# Patient Record
Sex: Female | Born: 1960 | Race: White | Hispanic: No
Health system: Midwestern US, Academic
[De-identification: ages and names within clinical notes are randomized; demographics above are authoritative.]

---

## 2016-10-13 ENCOUNTER — Encounter: Admit: 2016-10-13 | Discharge: 2016-10-13

## 2017-02-08 ENCOUNTER — Emergency Department: Admit: 2017-02-08 | Discharge: 2017-02-08 | Payer: PRIVATE HEALTH INSURANCE

## 2017-02-08 LAB — POC TROPONIN: Lab: 0 ng/mL (ref 0.00–0.05)

## 2017-02-08 LAB — CBC AND DIFF
Lab: 0.1 10*3/uL (ref 0–0.20)
Lab: 0.1 10*3/uL (ref 0–0.45)
Lab: 0.4 10*3/uL (ref 0–0.80)
Lab: 1 % (ref >60)
Lab: 1.3 10*3/uL (ref 1.0–4.8)
Lab: 10 % (ref 4–12)
Lab: 12 % (ref 11–15)
Lab: 12 g/dL (ref 12.0–15.0)
Lab: 2.3 K/UL (ref >60)
Lab: 230 K/UL (ref 150–400)
Lab: 3 % (ref 0–5)
Lab: 32 % (ref 24–44)
Lab: 34 g/dL (ref 32.0–36.0)
Lab: 35 % — ABNORMAL LOW (ref 36–45)
Lab: 4.2 K/UL — ABNORMAL LOW (ref 4.5–11.0)
Lab: 54 % (ref 41–77)
Lab: 7.6 FL (ref 7–11)

## 2017-02-08 LAB — PROTIME INR (PT): Lab: 1 FL (ref 0.8–1.2)

## 2017-02-08 LAB — PTT (APTT): Lab: 30 s (ref 20.0–36.0)

## 2017-02-08 LAB — COMPREHENSIVE METABOLIC PANEL: Lab: 136 MMOL/L — ABNORMAL LOW (ref 137–147)

## 2017-02-08 LAB — D-DIMER: Lab: 243 ng{FEU}/mL (ref <500)

## 2017-02-08 LAB — MAGNESIUM: Lab: 2.1 mg/dL — ABNORMAL LOW (ref 1.6–2.6)

## 2017-02-08 MED ORDER — NITROGLYCERIN 0.4 MG SL SUBL
.4 mg | SUBLINGUAL | 0 refills | Status: DC | PRN
Start: 2017-02-08 — End: 2017-02-09
  Administered 2017-02-08: 22:00:00 0.4 mg via SUBLINGUAL

## 2017-02-08 MED ORDER — ASPIRIN 81 MG PO CHEW
324 mg | Freq: Once | ORAL | 0 refills | Status: CP
Start: 2017-02-08 — End: ?
  Administered 2017-02-08: 22:00:00 324 mg via ORAL

## 2017-02-08 NOTE — ED Notes
Pt reports chest pain is relieved after one SLG NTG. Denies need for another nitro at this time.

## 2017-02-08 NOTE — ED Notes
Report to Brittini RN

## 2017-02-08 NOTE — ED Notes
Pt states at 8 a.m she had chest pain with soa, and dizziness, symptoms were intermittent ,c/o a heaviness to her chest, hx of anxiety and hx   PVC.  Pt placed on a monitor upon arrival, SR . Pt awake calm alert, color normal , skin warm and dry.   C/o feeling weak in her chest ,then has intermitten chest pain with intermitten  SOA .call light in reach , side rails up , bed locked in low position will continue to monitor.

## 2017-02-08 NOTE — ED Notes
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    Belongings disposition:  with patient at bedside/

## 2017-02-09 ENCOUNTER — Emergency Department
Admit: 2017-02-08 | Discharge: 2017-02-09 | Disposition: A | Discharge status: Home / Self Care | Payer: BC Managed Care – PPO

## 2017-02-09 DIAGNOSIS — R0789 Other chest pain: Principal | ICD-10-CM

## 2017-02-09 DIAGNOSIS — R002 Palpitations: ICD-10-CM

## 2017-02-10 ENCOUNTER — Encounter: Admit: 2017-02-10 | Discharge: 2017-02-10 | Payer: PRIVATE HEALTH INSURANCE

## 2017-02-10 ENCOUNTER — Ambulatory Visit: Admit: 2017-02-10 | Discharge: 2017-02-11 | Payer: BC Managed Care – PPO

## 2017-02-10 DIAGNOSIS — E785 Hyperlipidemia, unspecified: ICD-10-CM

## 2017-02-10 DIAGNOSIS — I499 Cardiac arrhythmia, unspecified: ICD-10-CM

## 2017-02-10 DIAGNOSIS — Q615 Medullary cystic kidney: ICD-10-CM

## 2017-02-10 DIAGNOSIS — N390 Urinary tract infection, site not specified: ICD-10-CM

## 2017-02-10 DIAGNOSIS — G4730 Sleep apnea, unspecified: ICD-10-CM

## 2017-02-10 DIAGNOSIS — R000 Tachycardia, unspecified: ICD-10-CM

## 2017-02-10 DIAGNOSIS — F329 Major depressive disorder, single episode, unspecified: ICD-10-CM

## 2017-02-10 DIAGNOSIS — E039 Hypothyroidism, unspecified: ICD-10-CM

## 2017-02-10 NOTE — Progress Notes
Date of Service: 02/10/2017    Holly Maldonado is a 56 y.o. female.       HPI       I had the pleasure of seeing Holly Maldonado for post ER cardiac follow-up.  She is a 56 year old with history of PVCs, sleep apnea, bulimia, and  dyslipidemia.  She has a remote history of tobacco use and a family history of premature coronary disease as well.  She follows with Dr. Clint Bolder routinely and last saw him in April.  She has a structurally normal heart.  She had a stress test around 2 years ago that was reportedly normal.  She had been on flecainide which was discontinued a year ago.  Since then she has had occasional palpitations that have not been particularly bothersome.    Over the past few weeks she has noticed an increase in palpitations as well as fatigue.  Tuesday she developed severe fatigue and chest heaviness that radiated into the left arm.  She is also had some shortness of breath with exertion.  She presented to the emergency department.  Her troponin was negative and her EKG showed no acute abnormalities.  She did have PVCs noted on the monitor but no other significant arrhythmias.  Her chest x-ray showed a small nodule but no other cardiopulmonary abnormalities.  It was recommended that she follow-up for potential stress test.    Holly Maldonado has felt better over the last couple days but she still has had fatigue and an increase in palpitations.  She states she stopped progesterone about 3 weeks ago and started taking black cohosh and primrose oil but she has had no other changes to her medications or lifestyle changes over the past few weeks.           Vitals:    02/10/17 0752   BP: 102/62   Pulse: 56   Weight: 72 kg (158 lb 12.8 oz)   Height: 1.626 m (5' 4)     Body mass index is 27.26 kg/m???.     Past Medical History  Patient Active Problem List    Diagnosis Date Noted   ??? Mobitz type 1 second degree atrioventricular block 08/26/2016     07/2016 Sleep study-mostly in REM sleep when OSAS severe ??? Bulimia 10/26/2013   ??? Hypokalemia 10/26/2013     Secondary to Hctz     ??? PVC's (premature ventricular contractions) 10/26/2013     Present on 2011 Holter  Suppress w/ exercise 2014  RVOT morphology (LB/Inf axis)     ??? Atrial tachycardia (HCC) 10/26/2013     2014 Bruce TM: 1-2' post exercise- a few PVCs, several 5-10 beat runs of AT at ~120bpm-as of 2015 she does not remember if she had any palpitation w/ the TM     ??? Maxillary sinus fracture (HCC) 08/22/2012   ??? Medullary sponge kidney 08/22/2012     2010 - HCTZ started by [facility].  Presented as hematuria.  CT-abdomen confirmed     ??? Hypothyroid    ??? Dyslipidemia    ??? Obstructive sleep apnea syndrome      07/2016 Moderate-severe, Mob1 in REM sleep when OSAS most severe     ??? Palpitations      03/19/10 Holter monitor:  Episodes of ventricular bigeminy associated with palpitations    multiple PVCs from EKG 08/14/12           Review of Systems   Constitution: Negative.   HENT: Negative.  Eyes: Negative.    Cardiovascular: Positive for chest pain (pressure), dyspnea on exertion and palpitations.   Respiratory: Negative.    Endocrine: Negative.    Hematologic/Lymphatic: Negative.    Skin: Negative.    Musculoskeletal: Negative.    Gastrointestinal: Negative.    Genitourinary: Negative.    Neurological: Negative.    Psychiatric/Behavioral: Negative.    Allergic/Immunologic: Negative.        Physical Exam  General Appearance: no acute distress  Skin: warm & intact  HEENT: unremarkable  Neck Veins: neck veins are flat & not distended  Carotid Arteries: no bruits  Chest Inspection: chest is normal in appearance  Auscultation/Percussion: lungs clear to auscultation, no rales, rhonchi, or wheezing  Cardiac Rhythm: regular rhythm & normal rate  Cardiac Auscultation: Normal S1 & S2, no S3 or S4, no rub  Murmurs: no cardiac murmurs   Extremities: no lower extremity edema; 2+ symmetric distal pulses  Abdominal Exam: soft, non-tender, no masses, bowel sounds normal Liver & Spleen: no organomegaly  Neurologic Exam: oriented to time, place and person; no focal neurologic deficits  Psychiatric: Normal mood and affect.  Behavior is normal. Judgment and thought content normal.             Problems Addressed Today  Encounter Diagnoses   Name Primary?   ??? PVC's (premature ventricular contractions) Yes   ??? Chest pain, unspecified type        Assessment and Plan       1.  Chest discomfort.  Her symptoms are atypical however she does have risk factors for coronary disease including dyslipidemia, remote tobacco use and a family history of premature coronary disease.  I will set her up with a stress echocardiogram to reassess her heart function and assess for ischemia.  2.  PVCs.  She had an increase in palpitations.  It is unclear if this represents an increase in PVCs however.  Her recent lab work done in the ER was normal.  She states she had her thyroid checked in August although I do not have the results of that.  She has had an increase in anxiety recently which may be contributing.  If her palpitations continue to be a problem I would recommend follow-up with Dr. Clint Bolder sooner than previously scheduled.    Thank you for allowing [name] to participate in the care of this pleasant individual.  If you have any other questions or concerns, please do not hesitate to contact us.           Current Medications (including today's revisions)  ??? aspirin 81 mg chewable tablet Chew 81 mg by mouth daily. Take with food.   ??? atorvastatin (LIPITOR) 10 mg tablet Take 10 mg by mouth daily.   ??? Cholecalciferol (Vitamin D3) 1,000 unit cap Take 1,000 Units by mouth.   ??? clonazePAM (KLONOPIN) 0.5 mg tablet Take 0.5 mg by mouth twice daily.   ??? DOCOSAHEXANOIC ACID/EPA (FISH OIL PO) Take 3,000 mg by mouth.   ??? fluoxetine(+) (PROZAC) 40 mg capsule Take 40 mg by mouth daily.   ??? lamoTRIgine (LAMICTAL) 25 mg tablet Take 50 mg by mouth daily. ??? levothyroxine (SYNTHROID) 125 mcg tablet Take 125 mcg by mouth daily.   ??? magnesium oxide (MAG-OX) 400 mg tablet Take 400 mg by mouth daily.   ??? vitamins, B complex tab Take 1 tablet by mouth daily.

## 2017-02-11 DIAGNOSIS — I493 Ventricular premature depolarization: ICD-10-CM

## 2017-02-11 DIAGNOSIS — R002 Palpitations: ICD-10-CM

## 2017-02-11 DIAGNOSIS — E785 Hyperlipidemia, unspecified: ICD-10-CM

## 2017-02-11 DIAGNOSIS — Z8249 Family history of ischemic heart disease and other diseases of the circulatory system: ICD-10-CM

## 2017-02-11 DIAGNOSIS — Z87891 Personal history of nicotine dependence: ICD-10-CM

## 2017-02-11 DIAGNOSIS — R079 Chest pain, unspecified: ICD-10-CM

## 2017-02-11 DIAGNOSIS — R0789 Other chest pain: Principal | ICD-10-CM

## 2017-02-18 ENCOUNTER — Encounter: Admit: 2017-02-18 | Discharge: 2017-02-18 | Payer: PRIVATE HEALTH INSURANCE

## 2017-02-18 ENCOUNTER — Ambulatory Visit: Admit: 2017-02-18 | Discharge: 2017-02-19 | Payer: BC Managed Care – PPO

## 2017-02-18 DIAGNOSIS — R079 Chest pain, unspecified: ICD-10-CM

## 2017-02-18 DIAGNOSIS — I493 Ventricular premature depolarization: Principal | ICD-10-CM

## 2017-02-18 NOTE — Progress Notes
[facility] Assessment for _________________________     Patient Name: Holly Maldonado  MR#: [id number]  DOB: 1960-07-20  Age: 56 y.o.  Gender: Female Requesting Physician (MD/DO):       Norman Clay, APRN     Referring Physician (MD/DO):            Study# _________ Date of Study:10__/12__/_   Date of Last Study: ____/____/_____ (ADAC / DSPECT)     Date of Last Office/Hosp Visit: ____/____/_____   If >24 hours complete the Assessment below:     > 24 Hour Assessment:   Risk Factors  (Y or N): Diabetes Mellitus  N Hypertension  N Cholesterol  Y Smoker  Y Packs per Day Years Quit 24 yrs ago     Since last MAC physician examination has the patient experienced:    Hospitalization: [x] No  [] Yes (explain) _____________________________   Emergency room visit: [x] No  [] Yes (explain) _____________________________     Change in CV symptoms:  Chest Pain [x] No [] Yes Scale  [] 1  [] 2  [] 3  [] 4  [] 5  [] 6  [] 7  [] 8  [] 9  [] 10   Shortness of Breath [] No [x] Yes _____________________________   Palpitations [] No [x] Yes _____________________________   Pre-syncope [x] No [] Yes _____________________________   Syncope [x] No [] Yes _____________________________   Transient ischemic attack /   Cardiovascular Accident [x] No [] Yes _____________________________   Hypertension [x] No [] Yes _____________________________     Other Indications/History:   Dyspnea; CP   Change in medication:   N/A    Allergies:   NKDA   Additional procedural comments:                     >1 year since last OV or New Patient (High Risk Screen):  Nutritional Risk: [x] None       Identified [] Unintentional Weight      Loss>10 lbs [] Unintentional Weight        Gain>10 lbs [] Non-Healing       Wound   Fall Risk: [x] None       Identified [] Hx of falls within      last 6 mo. [] Impaired       Balance/Mobility [] Use of Assistive       Device   Safety Screen: [x] None       Identified [] Patient does not feel safe at home [] Patient feels like harming      self or others         RN Assessment Signature: _____Lisa Larry Sierras, RN_________________ Date: __10__/_12___/_18___ Time: _8__ : _56__       Date of Dictation: ____/____/____       __________________________________________________  Reporting Physician Signature   MAC-UKH Pre-Test Assessment - Version: 2013-06-11

## 2017-02-23 ENCOUNTER — Encounter: Admit: 2017-02-23 | Discharge: 2017-02-23 | Payer: PRIVATE HEALTH INSURANCE

## 2017-02-23 NOTE — Telephone Encounter
Spoke with patient. She is requesting last week's stress echo results. Advised patient the results are not yet final but we will call her as soon as possible. She verbalizes an understanding and has no further questions at this time.

## 2017-09-01 ENCOUNTER — Ambulatory Visit: Admit: 2017-09-01 | Discharge: 2017-09-02 | Payer: BC Managed Care – PPO

## 2017-09-01 ENCOUNTER — Encounter: Admit: 2017-09-01 | Discharge: 2017-09-01 | Payer: PRIVATE HEALTH INSURANCE

## 2017-09-01 DIAGNOSIS — I493 Ventricular premature depolarization: Principal | ICD-10-CM

## 2017-09-06 ENCOUNTER — Encounter: Admit: 2017-09-06 | Discharge: 2017-09-06

## 2017-09-16 ENCOUNTER — Ambulatory Visit: Admit: 2017-09-16 | Discharge: 2017-09-17 | Payer: BC Managed Care – PPO

## 2017-09-16 ENCOUNTER — Encounter: Admit: 2017-09-16 | Discharge: 2017-09-16

## 2017-09-16 DIAGNOSIS — N390 Urinary tract infection, site not specified: ICD-10-CM

## 2017-09-16 DIAGNOSIS — E785 Hyperlipidemia, unspecified: ICD-10-CM

## 2017-09-16 DIAGNOSIS — Q615 Medullary cystic kidney: ICD-10-CM

## 2017-09-16 DIAGNOSIS — R000 Tachycardia, unspecified: ICD-10-CM

## 2017-09-16 DIAGNOSIS — E039 Hypothyroidism, unspecified: ICD-10-CM

## 2017-09-16 DIAGNOSIS — I499 Cardiac arrhythmia, unspecified: ICD-10-CM

## 2017-09-16 DIAGNOSIS — G4730 Sleep apnea, unspecified: ICD-10-CM

## 2017-09-16 DIAGNOSIS — F329 Major depressive disorder, single episode, unspecified: ICD-10-CM

## 2017-09-17 DIAGNOSIS — I441 Atrioventricular block, second degree: ICD-10-CM

## 2017-09-17 DIAGNOSIS — I493 Ventricular premature depolarization: Principal | ICD-10-CM

## 2017-12-29 ENCOUNTER — Encounter: Admit: 2017-12-29 | Discharge: 2017-12-29 | Payer: PRIVATE HEALTH INSURANCE

## 2018-02-16 ENCOUNTER — Encounter: Admit: 2018-02-16 | Discharge: 2018-02-16

## 2018-02-16 ENCOUNTER — Encounter: Admit: 2018-02-16 | Discharge: 2018-02-16 | Payer: PRIVATE HEALTH INSURANCE

## 2018-02-16 ENCOUNTER — Ambulatory Visit: Admit: 2018-02-16 | Discharge: 2018-02-17 | Payer: BC Managed Care – PPO

## 2018-02-16 DIAGNOSIS — F99 Mental disorder, not otherwise specified: ICD-10-CM

## 2018-02-16 DIAGNOSIS — J309 Allergic rhinitis, unspecified: ICD-10-CM

## 2018-02-16 DIAGNOSIS — E785 Hyperlipidemia, unspecified: ICD-10-CM

## 2018-02-16 DIAGNOSIS — R51 Headache: ICD-10-CM

## 2018-02-16 DIAGNOSIS — G4730 Sleep apnea, unspecified: ICD-10-CM

## 2018-02-16 DIAGNOSIS — E039 Hypothyroidism, unspecified: ICD-10-CM

## 2018-02-16 DIAGNOSIS — I499 Cardiac arrhythmia, unspecified: ICD-10-CM

## 2018-02-16 DIAGNOSIS — F4310 Post-traumatic stress disorder, unspecified: ICD-10-CM

## 2018-02-16 DIAGNOSIS — Q615 Medullary cystic kidney: ICD-10-CM

## 2018-02-16 DIAGNOSIS — Z8659 Personal history of other mental and behavioral disorders: ICD-10-CM

## 2018-02-16 DIAGNOSIS — M791 Myalgia, unspecified site: Principal | ICD-10-CM

## 2018-02-16 DIAGNOSIS — N390 Urinary tract infection, site not specified: ICD-10-CM

## 2018-02-16 DIAGNOSIS — R000 Tachycardia, unspecified: ICD-10-CM

## 2018-02-16 DIAGNOSIS — F329 Major depressive disorder, single episode, unspecified: ICD-10-CM

## 2018-02-16 MED ORDER — FLUOXETINE 20 MG PO CAP
ORAL_CAPSULE | Freq: Every day | 1 refills | Status: DC
Start: 2018-02-16 — End: 2018-04-18
  Filled 2018-02-16 (×2): qty 120, 30d supply, fill #1

## 2018-02-16 MED ORDER — CETIRIZINE-PSEUDOEPHEDRINE 5-120 MG PO TB12
1 | ORAL_TABLET | Freq: Two times a day (BID) | ORAL | 2 refills | 30.00000 days | Status: AC
Start: 2018-02-16 — End: 2018-03-22

## 2018-02-16 MED ORDER — CLONAZEPAM 0.5 MG PO TAB
ORAL_TABLET | Freq: Every day | 1 refills | Status: DC | PRN
Start: 2018-02-16 — End: 2018-04-18
  Filled 2018-02-16 (×2): qty 120, 30d supply, fill #1

## 2018-02-16 MED ORDER — MOMETASONE 50 MCG/ACTUATION NA SPRY
1 | Freq: Two times a day (BID) | NASAL | 2 refills | 26.00000 days | Status: AC
Start: 2018-02-16 — End: 2018-05-25
  Filled 2018-02-16 (×2): qty 17, 30d supply, fill #1

## 2018-02-16 MED ORDER — AZELASTINE 137 MCG (0.1 %) NA SPRA
1-2 | Freq: Two times a day (BID) | NASAL | 2 refills | 50.00000 days | Status: AC | PRN
Start: 2018-02-16 — End: 2018-05-25
  Filled 2018-02-16 (×2): qty 30, 25d supply, fill #1

## 2018-02-16 MED ORDER — OLOPATADINE 0.1 % OP DROP
1 [drp] | Freq: Two times a day (BID) | OPHTHALMIC | 2 refills | 27.50000 days | Status: AC | PRN
Start: 2018-02-16 — End: 2018-05-25
  Filled 2018-02-16 (×2): qty 5, 25d supply, fill #1

## 2018-02-16 MED ORDER — ARMODAFINIL 250 MG PO TAB
250 mg | ORAL_TABLET | Freq: Every day | ORAL | 1 refills | 30.00000 days | Status: DC
Start: 2018-02-16 — End: 2018-04-18
  Filled 2018-02-16 (×2): qty 30, 30d supply, fill #1

## 2018-03-15 ENCOUNTER — Encounter: Admit: 2018-03-15 | Discharge: 2018-03-15 | Payer: PRIVATE HEALTH INSURANCE

## 2018-03-15 ENCOUNTER — Encounter: Admit: 2018-03-15 | Discharge: 2018-03-15

## 2018-03-16 ENCOUNTER — Encounter: Admit: 2018-03-16 | Discharge: 2018-03-16 | Payer: PRIVATE HEALTH INSURANCE

## 2018-03-16 ENCOUNTER — Encounter: Admit: 2018-03-16 | Discharge: 2018-03-16

## 2018-03-17 ENCOUNTER — Encounter: Admit: 2018-03-17 | Discharge: 2018-03-17

## 2018-03-17 ENCOUNTER — Encounter: Admit: 2018-03-17 | Discharge: 2018-03-17 | Payer: PRIVATE HEALTH INSURANCE

## 2018-03-18 MED FILL — CLONAZEPAM 0.5 MG PO TAB: 0.5 mg | 30 days supply | Qty: 120 | Fill #2 | Status: CP

## 2018-03-18 MED FILL — MOMETASONE 50 MCG/ACTUATION NA SPRY: 50 mcg/actuation | NASAL | 30 days supply | Qty: 17 | Fill #2 | Status: CP

## 2018-03-18 MED FILL — FLUOXETINE 20 MG PO CAP: 20 mg | 30 days supply | Qty: 120 | Fill #2 | Status: CP

## 2018-03-20 ENCOUNTER — Encounter: Admit: 2018-03-20 | Discharge: 2018-03-20 | Payer: PRIVATE HEALTH INSURANCE

## 2018-03-21 ENCOUNTER — Encounter: Admit: 2018-03-21 | Discharge: 2018-03-21 | Payer: PRIVATE HEALTH INSURANCE

## 2018-03-22 ENCOUNTER — Encounter: Admit: 2018-03-22 | Discharge: 2018-03-22 | Payer: PRIVATE HEALTH INSURANCE

## 2018-03-22 MED ORDER — CETIRIZINE-PSEUDOEPHEDRINE 5-120 MG PO TB12
1 | ORAL_TABLET | Freq: Two times a day (BID) | ORAL | 2 refills | 30.00000 days | Status: AC
Start: 2018-03-22 — End: 2018-05-25

## 2018-03-23 ENCOUNTER — Encounter: Admit: 2018-03-23 | Discharge: 2018-03-23

## 2018-03-27 ENCOUNTER — Encounter: Admit: 2018-03-27 | Discharge: 2018-03-27 | Payer: PRIVATE HEALTH INSURANCE

## 2018-03-28 MED FILL — ARMODAFINIL 250 MG PO TAB: 250 mg | ORAL | 30 days supply | Qty: 30 | Fill #2 | Status: CP

## 2018-04-04 ENCOUNTER — Encounter: Admit: 2018-04-04 | Discharge: 2018-04-04 | Payer: PRIVATE HEALTH INSURANCE

## 2018-04-04 ENCOUNTER — Encounter: Admit: 2018-04-04 | Discharge: 2018-04-04

## 2018-04-04 MED ORDER — LEVOTHYROXINE 125 MCG PO CAP
125 ug | ORAL_CAPSULE | Freq: Every day | ORAL | 3 refills | Status: CN
Start: 2018-04-04 — End: ?

## 2018-04-04 MED ORDER — LEVOTHYROXINE 125 MCG PO TAB
ORAL_TABLET | Freq: Every day | ORAL | 3 refills | 30.00000 days | Status: AC
Start: 2018-04-04 — End: 2018-10-17
  Filled 2018-04-06 (×2): qty 90, 90d supply, fill #1

## 2018-04-04 MED ORDER — LEVOTHYROXINE 125 MCG PO CAP
125 ug | ORAL_CAPSULE | Freq: Every day | ORAL | 3 refills | 30.00000 days | Status: DC
Start: 2018-04-04 — End: 2018-04-04

## 2018-04-05 ENCOUNTER — Encounter: Admit: 2018-04-05 | Discharge: 2018-04-05 | Payer: PRIVATE HEALTH INSURANCE

## 2018-04-17 ENCOUNTER — Encounter: Admit: 2018-04-17 | Discharge: 2018-04-17 | Payer: PRIVATE HEALTH INSURANCE

## 2018-04-17 ENCOUNTER — Encounter: Admit: 2018-04-17 | Discharge: 2018-04-17

## 2018-04-17 MED ORDER — CETIRIZINE-PSEUDOEPHEDRINE 5-120 MG PO TB12
1 | ORAL_TABLET | Freq: Two times a day (BID) | ORAL | 2 refills | Status: CN
Start: 2018-04-17 — End: ?

## 2018-04-17 MED ORDER — CETIRIZINE-PSEUDOEPHEDRINE 5-120 MG PO TB12
1 | ORAL_TABLET | Freq: Two times a day (BID) | ORAL | 1 refills | 28.00000 days | Status: AC
Start: 2018-04-17 — End: 2018-05-25
  Filled 2018-04-18 (×2): qty 60, 24d supply, fill #1

## 2018-04-17 MED FILL — MOMETASONE 50 MCG/ACTUATION NA SPRY: 50 mcg/actuation | NASAL | 30 days supply | Qty: 17 | Fill #3 | Status: AC

## 2018-04-17 MED FILL — AZELASTINE 137 MCG (0.1 %) NA SPRA: 137 mcg (0.1 %) | NASAL | 25 days supply | Qty: 30 | Fill #2 | Status: AC

## 2018-04-18 ENCOUNTER — Encounter: Admit: 2018-04-18 | Discharge: 2018-04-18 | Payer: PRIVATE HEALTH INSURANCE

## 2018-04-18 MED ORDER — FLUOXETINE 20 MG PO CAP
ORAL_CAPSULE | Freq: Every day | 1 refills | Status: AC
Start: 2018-04-18 — End: 2018-05-25
  Filled 2018-04-18 (×2): qty 120, 30d supply, fill #1

## 2018-04-18 MED ORDER — ARMODAFINIL 250 MG PO TAB
250 mg | ORAL_TABLET | Freq: Every day | ORAL | 1 refills | Status: DC
Start: 2018-04-18 — End: 2018-08-24
  Filled 2018-04-28 (×2): qty 30, 30d supply, fill #1

## 2018-04-18 MED ORDER — CLONAZEPAM 0.5 MG PO TAB
ORAL_TABLET | Freq: Every day | 1 refills | Status: DC | PRN
Start: 2018-04-18 — End: 2019-03-19
  Filled 2018-04-18 (×2): qty 120, 30d supply, fill #1

## 2018-04-21 ENCOUNTER — Encounter: Admit: 2018-04-21 | Discharge: 2018-04-21

## 2018-04-27 ENCOUNTER — Ambulatory Visit: Admit: 2018-04-27 | Discharge: 2018-04-28 | Payer: BC Managed Care – PPO

## 2018-04-27 ENCOUNTER — Encounter: Admit: 2018-04-27 | Discharge: 2018-04-27

## 2018-04-27 ENCOUNTER — Encounter: Admit: 2018-04-27 | Discharge: 2018-04-27 | Payer: PRIVATE HEALTH INSURANCE

## 2018-04-27 DIAGNOSIS — Z8659 Personal history of other mental and behavioral disorders: ICD-10-CM

## 2018-04-27 DIAGNOSIS — N390 Urinary tract infection, site not specified: ICD-10-CM

## 2018-04-27 DIAGNOSIS — I499 Cardiac arrhythmia, unspecified: ICD-10-CM

## 2018-04-27 DIAGNOSIS — F329 Major depressive disorder, single episode, unspecified: ICD-10-CM

## 2018-04-27 DIAGNOSIS — G4733 Obstructive sleep apnea (adult) (pediatric): Principal | ICD-10-CM

## 2018-04-27 DIAGNOSIS — F4310 Post-traumatic stress disorder, unspecified: ICD-10-CM

## 2018-04-27 DIAGNOSIS — E785 Hyperlipidemia, unspecified: ICD-10-CM

## 2018-04-27 DIAGNOSIS — R000 Tachycardia, unspecified: ICD-10-CM

## 2018-04-27 DIAGNOSIS — G4730 Sleep apnea, unspecified: ICD-10-CM

## 2018-04-27 DIAGNOSIS — Z789 Other specified health status: ICD-10-CM

## 2018-04-27 DIAGNOSIS — F99 Mental disorder, not otherwise specified: ICD-10-CM

## 2018-04-27 DIAGNOSIS — E039 Hypothyroidism, unspecified: ICD-10-CM

## 2018-04-27 DIAGNOSIS — Q615 Medullary cystic kidney: ICD-10-CM

## 2018-04-27 MED ORDER — LEVOTHYROXINE 150 MCG PO CAP
ORAL_CAPSULE | Freq: Every day | ORAL | 2 refills | 30.00000 days | Status: DC
Start: 2018-04-27 — End: 2018-05-01
  Filled 2018-04-27: qty 60, 60d supply

## 2018-04-27 MED ORDER — AZITHROMYCIN 250 MG PO TAB
ORAL_TABLET | 1 refills | Status: AC
Start: 2018-04-27 — End: 2018-05-25
  Filled 2018-04-28 (×2): qty 6, 5d supply, fill #1

## 2018-04-28 ENCOUNTER — Encounter: Admit: 2018-04-28 | Discharge: 2018-04-28 | Payer: PRIVATE HEALTH INSURANCE

## 2018-04-30 ENCOUNTER — Encounter: Admit: 2018-04-30 | Discharge: 2018-04-30 | Payer: PRIVATE HEALTH INSURANCE

## 2018-04-30 DIAGNOSIS — G4733 Obstructive sleep apnea (adult) (pediatric): Principal | ICD-10-CM

## 2018-04-30 MED ORDER — GLYCOPYRROLATE 0.2 MG/ML IJ SOLN
.2 mg | Freq: Once | INTRAVENOUS | 0 refills | Status: CN
Start: 2018-04-30 — End: ?

## 2018-05-01 ENCOUNTER — Encounter: Admit: 2018-05-01 | Discharge: 2018-05-01

## 2018-05-01 ENCOUNTER — Encounter: Admit: 2018-05-01 | Discharge: 2018-05-01 | Payer: PRIVATE HEALTH INSURANCE

## 2018-05-01 ENCOUNTER — Ambulatory Visit: Admit: 2018-05-01 | Discharge: 2018-05-01

## 2018-05-01 DIAGNOSIS — G4733 Obstructive sleep apnea (adult) (pediatric): Principal | ICD-10-CM

## 2018-05-01 MED ORDER — LEVOTHYROXINE 150 MCG PO TAB
150 ug | ORAL_TABLET | Freq: Every day | ORAL | 0 refills | 30.00000 days | Status: DC
Start: 2018-05-01 — End: 2018-07-04
  Filled 2018-05-01: qty 90, 90d supply

## 2018-05-11 ENCOUNTER — Encounter: Admit: 2018-05-11 | Discharge: 2018-05-11 | Payer: PRIVATE HEALTH INSURANCE

## 2018-05-11 ENCOUNTER — Ambulatory Visit: Admit: 2018-05-11 | Discharge: 2018-05-12 | Payer: BC Managed Care – PPO

## 2018-05-11 ENCOUNTER — Encounter: Admit: 2018-05-11 | Discharge: 2018-05-11

## 2018-05-11 DIAGNOSIS — I499 Cardiac arrhythmia, unspecified: Secondary | ICD-10-CM

## 2018-05-11 DIAGNOSIS — N390 Urinary tract infection, site not specified: Secondary | ICD-10-CM

## 2018-05-11 DIAGNOSIS — R000 Tachycardia, unspecified: Secondary | ICD-10-CM

## 2018-05-11 DIAGNOSIS — G4730 Sleep apnea, unspecified: Secondary | ICD-10-CM

## 2018-05-11 DIAGNOSIS — E785 Hyperlipidemia, unspecified: Secondary | ICD-10-CM

## 2018-05-11 DIAGNOSIS — E039 Hypothyroidism, unspecified: Secondary | ICD-10-CM

## 2018-05-11 DIAGNOSIS — F99 Mental disorder, not otherwise specified: Secondary | ICD-10-CM

## 2018-05-11 DIAGNOSIS — Q615 Medullary cystic kidney: Secondary | ICD-10-CM

## 2018-05-11 DIAGNOSIS — R51 Headache: Secondary | ICD-10-CM

## 2018-05-11 DIAGNOSIS — F4310 Post-traumatic stress disorder, unspecified: Secondary | ICD-10-CM

## 2018-05-11 DIAGNOSIS — F329 Major depressive disorder, single episode, unspecified: Secondary | ICD-10-CM

## 2018-05-11 DIAGNOSIS — Z8659 Personal history of other mental and behavioral disorders: Secondary | ICD-10-CM

## 2018-05-11 DIAGNOSIS — J309 Allergic rhinitis, unspecified: Secondary | ICD-10-CM

## 2018-05-11 MED ORDER — PSEUDOEPHEDRINE HCL 120 MG PO TBER
120 mg | Freq: Two times a day (BID) | ORAL | 0 refills | Status: AC | PRN
Start: 2018-05-11 — End: 2018-05-25

## 2018-05-11 MED FILL — LEVOTHYROXINE 150 MCG PO TAB: 150 mcg | ORAL | 90 days supply | Qty: 90 | Fill #1 | Status: AC

## 2018-05-14 ENCOUNTER — Encounter: Admit: 2018-05-14 | Discharge: 2018-05-14 | Payer: PRIVATE HEALTH INSURANCE

## 2018-05-14 DIAGNOSIS — E785 Hyperlipidemia, unspecified: Secondary | ICD-10-CM

## 2018-05-14 DIAGNOSIS — E039 Hypothyroidism, unspecified: Secondary | ICD-10-CM

## 2018-05-14 DIAGNOSIS — F99 Mental disorder, not otherwise specified: Secondary | ICD-10-CM

## 2018-05-14 DIAGNOSIS — Q615 Medullary cystic kidney: Secondary | ICD-10-CM

## 2018-05-14 DIAGNOSIS — G4730 Sleep apnea, unspecified: Secondary | ICD-10-CM

## 2018-05-14 DIAGNOSIS — N390 Urinary tract infection, site not specified: Secondary | ICD-10-CM

## 2018-05-14 DIAGNOSIS — F4310 Post-traumatic stress disorder, unspecified: Secondary | ICD-10-CM

## 2018-05-14 DIAGNOSIS — I499 Cardiac arrhythmia, unspecified: Secondary | ICD-10-CM

## 2018-05-14 DIAGNOSIS — F329 Major depressive disorder, single episode, unspecified: Secondary | ICD-10-CM

## 2018-05-14 DIAGNOSIS — Z8659 Personal history of other mental and behavioral disorders: Secondary | ICD-10-CM

## 2018-05-14 DIAGNOSIS — R000 Tachycardia, unspecified: Secondary | ICD-10-CM

## 2018-05-15 ENCOUNTER — Encounter: Admit: 2018-05-15 | Discharge: 2018-05-15 | Payer: PRIVATE HEALTH INSURANCE

## 2018-05-15 ENCOUNTER — Encounter: Admit: 2018-05-15 | Discharge: 2018-05-15

## 2018-05-23 ENCOUNTER — Encounter: Admit: 2018-05-23 | Discharge: 2018-05-23 | Payer: PRIVATE HEALTH INSURANCE

## 2018-05-24 ENCOUNTER — Encounter: Admit: 2018-05-24 | Discharge: 2018-05-24 | Payer: PRIVATE HEALTH INSURANCE

## 2018-05-24 MED ORDER — VILAZODONE 40 MG PO TAB
40 mg | ORAL_TABLET | Freq: Every day | ORAL | 1 refills | 30.00000 days | Status: DC
Start: 2018-05-24 — End: 2018-07-25
  Filled 2018-05-27 (×2): qty 30, 30d supply, fill #1

## 2018-05-24 MED ORDER — MOMETASONE 50 MCG/ACTUATION NA SPRY
1 | Freq: Two times a day (BID) | NASAL | 2 refills | Status: CN
Start: 2018-05-24 — End: ?

## 2018-05-24 MED ORDER — CLONAZEPAM 1 MG PO TAB
.5 mg | ORAL_TABLET | Freq: Four times a day (QID) | ORAL | 1 refills | Status: DC | PRN
Start: 2018-05-24 — End: 2018-08-24
  Filled 2018-05-27 (×2): qty 60, 30d supply, fill #1

## 2018-05-24 MED ORDER — ARMODAFINIL 250 MG PO TAB
250 mg | ORAL_TABLET | Freq: Every day | ORAL | 1 refills | Status: AC
Start: 2018-05-24 — End: 2018-10-17
  Filled 2018-05-27 (×2): qty 30, 30d supply, fill #1

## 2018-05-25 ENCOUNTER — Encounter: Admit: 2018-05-25 | Discharge: 2018-05-25 | Payer: PRIVATE HEALTH INSURANCE

## 2018-05-25 ENCOUNTER — Ambulatory Visit: Admit: 2018-05-25 | Discharge: 2018-05-25 | Payer: BC Managed Care – PPO

## 2018-05-25 DIAGNOSIS — N390 Urinary tract infection, site not specified: Secondary | ICD-10-CM

## 2018-05-25 DIAGNOSIS — R000 Tachycardia, unspecified: Secondary | ICD-10-CM

## 2018-05-25 DIAGNOSIS — F329 Major depressive disorder, single episode, unspecified: Secondary | ICD-10-CM

## 2018-05-25 DIAGNOSIS — E039 Hypothyroidism, unspecified: Secondary | ICD-10-CM

## 2018-05-25 DIAGNOSIS — Z8659 Personal history of other mental and behavioral disorders: Secondary | ICD-10-CM

## 2018-05-25 DIAGNOSIS — G4730 Sleep apnea, unspecified: Secondary | ICD-10-CM

## 2018-05-25 DIAGNOSIS — F99 Mental disorder, not otherwise specified: Secondary | ICD-10-CM

## 2018-05-25 DIAGNOSIS — F4310 Post-traumatic stress disorder, unspecified: Secondary | ICD-10-CM

## 2018-05-25 DIAGNOSIS — J309 Allergic rhinitis, unspecified: Secondary | ICD-10-CM

## 2018-05-25 DIAGNOSIS — Q615 Medullary cystic kidney: Secondary | ICD-10-CM

## 2018-05-25 DIAGNOSIS — E785 Hyperlipidemia, unspecified: Secondary | ICD-10-CM

## 2018-05-25 DIAGNOSIS — I499 Cardiac arrhythmia, unspecified: Secondary | ICD-10-CM

## 2018-05-25 MED ORDER — CETIRIZINE-PSEUDOEPHEDRINE 5-120 MG PO TB12
1 | ORAL_TABLET | Freq: Two times a day (BID) | ORAL | 6 refills | 30.00000 days | Status: AC
Start: 2018-05-25 — End: 2018-10-17

## 2018-05-25 MED ORDER — AZELASTINE 137 MCG (0.1 %) NA SPRA
1-2 | Freq: Two times a day (BID) | NASAL | 6 refills | 50.00000 days | Status: AC | PRN
Start: 2018-05-25 — End: 2019-03-29
  Filled 2018-05-27 (×2): qty 30, 25d supply, fill #1

## 2018-05-25 MED ORDER — MOMETASONE 50 MCG/ACTUATION NA SPRY
1 | Freq: Two times a day (BID) | NASAL | 6 refills | 26.00000 days | Status: AC
Start: 2018-05-25 — End: 2019-03-29
  Filled 2018-05-27 (×2): qty 17, 30d supply, fill #1

## 2018-05-25 MED ORDER — OLOPATADINE 0.1 % OP DROP
1 [drp] | Freq: Two times a day (BID) | OPHTHALMIC | 6 refills | 27.50000 days | Status: AC | PRN
Start: 2018-05-25 — End: 2019-08-14
  Filled 2018-05-27 (×2): qty 5, 25d supply, fill #1

## 2018-05-25 MED ORDER — MONTELUKAST 10 MG PO TAB
10 mg | ORAL_TABLET | Freq: Every evening | ORAL | 6 refills | 90.00000 days | Status: AC
Start: 2018-05-25 — End: 2019-03-20
  Filled 2018-05-27 (×2): qty 30, 30d supply, fill #1

## 2018-05-26 ENCOUNTER — Encounter: Admit: 2018-05-26 | Discharge: 2018-05-26 | Payer: PRIVATE HEALTH INSURANCE

## 2018-05-30 ENCOUNTER — Encounter: Admit: 2018-05-30 | Discharge: 2018-05-30

## 2018-06-06 ENCOUNTER — Encounter: Admit: 2018-06-06 | Discharge: 2018-06-06 | Payer: PRIVATE HEALTH INSURANCE

## 2018-06-06 MED ORDER — ROSUVASTATIN 10 MG PO TAB
ORAL_TABLET | Freq: Every day | ORAL | 0 refills | 90.00000 days | Status: DC
Start: 2018-06-06 — End: 2018-07-24
  Filled 2018-06-07 (×2): qty 90, 90d supply, fill #1

## 2018-06-07 ENCOUNTER — Encounter: Admit: 2018-06-07 | Discharge: 2018-06-07 | Payer: PRIVATE HEALTH INSURANCE

## 2018-06-19 ENCOUNTER — Encounter: Admit: 2018-06-19 | Discharge: 2018-06-19 | Payer: PRIVATE HEALTH INSURANCE

## 2018-06-19 MED ORDER — BUSPIRONE 10 MG PO TAB
10 mg | ORAL_TABLET | Freq: Two times a day (BID) | ORAL | 1 refills | Status: AC
Start: 2018-06-19 — End: 2018-09-04
  Filled 2018-06-20: qty 60, 30d supply

## 2018-06-20 ENCOUNTER — Encounter: Admit: 2018-06-20 | Discharge: 2018-06-20

## 2018-06-30 ENCOUNTER — Encounter: Admit: 2018-06-30 | Discharge: 2018-06-30

## 2018-07-03 ENCOUNTER — Encounter: Admit: 2018-07-03 | Discharge: 2018-07-03 | Payer: PRIVATE HEALTH INSURANCE

## 2018-07-03 MED FILL — LEVOTHYROXINE 125 MCG PO TAB: 125 mcg | 90 days supply | Qty: 90 | Status: CN

## 2018-07-04 ENCOUNTER — Encounter: Admit: 2018-07-04 | Discharge: 2018-07-04 | Payer: PRIVATE HEALTH INSURANCE

## 2018-07-04 MED ORDER — LEVOTHYROXINE 150 MCG PO TAB
ORAL_TABLET | Freq: Every day | ORAL | 0 refills | 30.00000 days | Status: AC
Start: 2018-07-04 — End: 2018-12-15
  Filled 2018-09-14 (×2): qty 90, 90d supply, fill #1

## 2018-07-04 MED FILL — CLONAZEPAM 1 MG PO TAB: 1 mg | ORAL | 30 days supply | Qty: 60 | Fill #2 | Status: CP

## 2018-07-04 MED FILL — MONTELUKAST 10 MG PO TAB: 10 mg | ORAL | 30 days supply | Qty: 30 | Fill #2 | Status: CP

## 2018-07-04 MED FILL — MOMETASONE 50 MCG/ACTUATION NA SPRY: 50 mcg/actuation | NASAL | 30 days supply | Qty: 17 | Fill #2 | Status: CP

## 2018-07-04 MED FILL — ARMODAFINIL 250 MG PO TAB: 250 mg | ORAL | 30 days supply | Qty: 30 | Fill #2 | Status: CP

## 2018-07-04 MED FILL — VILAZODONE 40 MG PO TAB: 40 mg | ORAL | 30 days supply | Qty: 30 | Fill #2 | Status: CP

## 2018-07-04 MED FILL — BUSPIRONE 10 MG PO TAB: 10 mg | ORAL | 30 days supply | Qty: 60 | Fill #1 | Status: CP

## 2018-07-06 ENCOUNTER — Encounter: Admit: 2018-07-06 | Discharge: 2018-07-06 | Payer: PRIVATE HEALTH INSURANCE

## 2018-07-06 MED ORDER — BUSPIRONE 10 MG PO TAB
20 mg | ORAL_TABLET | Freq: Two times a day (BID) | ORAL | 1 refills | Status: AC
Start: 2018-07-06 — End: 2018-09-04
  Filled 2018-07-26 (×2): qty 60, 15d supply, fill #1

## 2018-07-06 MED ORDER — ATOMOXETINE 18 MG PO CAP
ORAL_CAPSULE | Freq: Every day | ORAL | 0 refills | Status: AC
Start: 2018-07-06 — End: 2018-09-04
  Filled 2018-07-07 (×2): qty 46, 30d supply, fill #1

## 2018-07-24 ENCOUNTER — Encounter: Admit: 2018-07-24 | Discharge: 2018-07-24 | Payer: PRIVATE HEALTH INSURANCE

## 2018-07-24 MED ORDER — ROSUVASTATIN 10 MG PO TAB
10 mg | ORAL_TABLET | Freq: Every day | ORAL | 0 refills | 90.00000 days | Status: DC
Start: 2018-07-24 — End: 2018-11-08
  Filled 2018-08-23 (×2): qty 90, 90d supply, fill #1

## 2018-07-24 MED ORDER — VILAZODONE 40 MG PO TAB
40 mg | ORAL_TABLET | Freq: Every day | ORAL | 1 refills | Status: AC
Start: 2018-07-24 — End: 2018-09-04
  Filled 2018-07-28 (×2): qty 30, 30d supply, fill #1

## 2018-07-25 ENCOUNTER — Encounter: Admit: 2018-07-25 | Discharge: 2018-07-25 | Payer: PRIVATE HEALTH INSURANCE

## 2018-07-25 ENCOUNTER — Encounter: Admit: 2018-07-25 | Discharge: 2018-07-25

## 2018-07-25 MED ORDER — ATOMOXETINE 40 MG PO CAP
40 mg | ORAL_CAPSULE | Freq: Every day | ORAL | 1 refills | Status: AC
Start: 2018-07-25 — End: 2018-09-04
  Filled 2018-07-26 (×2): qty 30, 30d supply, fill #1

## 2018-07-26 ENCOUNTER — Encounter: Admit: 2018-07-26 | Discharge: 2018-07-26 | Payer: PRIVATE HEALTH INSURANCE

## 2018-07-27 ENCOUNTER — Encounter: Admit: 2018-07-27 | Discharge: 2018-07-27

## 2018-07-27 ENCOUNTER — Encounter: Admit: 2018-07-27 | Discharge: 2018-07-27 | Payer: PRIVATE HEALTH INSURANCE

## 2018-07-28 MED FILL — MONTELUKAST 10 MG PO TAB: 10 mg | ORAL | 30 days supply | Qty: 30 | Fill #3 | Status: CP

## 2018-07-31 ENCOUNTER — Encounter: Admit: 2018-07-31 | Discharge: 2018-07-31 | Payer: PRIVATE HEALTH INSURANCE

## 2018-07-31 MED ORDER — BUSPIRONE 30 MG PO TAB
30 mg | ORAL_TABLET | Freq: Two times a day (BID) | ORAL | 1 refills | Status: AC
Start: 2018-07-31 — End: 2018-09-04
  Filled 2018-08-01 (×2): qty 60, 30d supply, fill #1

## 2018-08-01 ENCOUNTER — Encounter: Admit: 2018-08-01 | Discharge: 2018-08-01 | Payer: PRIVATE HEALTH INSURANCE

## 2018-08-03 ENCOUNTER — Encounter: Admit: 2018-08-03 | Discharge: 2018-08-03 | Payer: PRIVATE HEALTH INSURANCE

## 2018-08-03 NOTE — Telephone Encounter
Attempted to call pt. No answer. Left message advising pt that her appointment on 08-11-18 is being canceled due to the COVID-19 and that the office will call her when we are able to start rescheduling patients. Also offered pt a Telehealth appointment and advised her to call office if she is interested.

## 2018-08-21 ENCOUNTER — Encounter: Admit: 2018-08-21 | Discharge: 2018-08-21 | Payer: PRIVATE HEALTH INSURANCE

## 2018-08-21 ENCOUNTER — Encounter: Admit: 2018-08-21 | Discharge: 2018-08-21

## 2018-08-22 ENCOUNTER — Encounter: Admit: 2018-08-22 | Discharge: 2018-08-22 | Payer: PRIVATE HEALTH INSURANCE

## 2018-08-23 MED FILL — MOMETASONE 50 MCG/ACTUATION NA SPRY: 50 mcg/actuation | NASAL | 30 days supply | Qty: 17 | Fill #3 | Status: CP

## 2018-08-23 MED FILL — ARMODAFINIL 250 MG PO TAB: 250 mg | ORAL | 30 days supply | Qty: 30 | Fill #2 | Status: CP

## 2018-08-24 ENCOUNTER — Encounter: Admit: 2018-08-24 | Discharge: 2018-08-24

## 2018-08-24 ENCOUNTER — Encounter: Admit: 2018-08-24 | Discharge: 2018-08-24 | Payer: PRIVATE HEALTH INSURANCE

## 2018-08-24 MED ORDER — CLONAZEPAM 1 MG PO TAB
ORAL_TABLET | Freq: Four times a day (QID) | ORAL | 1 refills | 30.00000 days | Status: DC | PRN
Start: 2018-08-24 — End: 2018-11-09
  Filled 2018-08-25 (×2): qty 60, 30d supply, fill #1

## 2018-08-24 MED ORDER — LIOTHYRONINE 25 MCG PO TAB
ORAL_TABLET | Freq: Every day | ORAL | 0 refills | 30.00000 days | Status: DC
Start: 2018-08-24 — End: 2018-10-17
  Filled 2018-08-25 (×2): qty 46, 23d supply, fill #1

## 2018-08-24 MED ORDER — ARMODAFINIL 250 MG PO TAB
250 mg | ORAL_TABLET | Freq: Every day | ORAL | 1 refills | Status: DC
Start: 2018-08-24 — End: 2018-12-14
  Filled 2018-09-27 (×2): qty 30, 30d supply, fill #1

## 2018-08-25 ENCOUNTER — Encounter: Admit: 2018-08-25 | Discharge: 2018-08-25 | Payer: PRIVATE HEALTH INSURANCE

## 2018-09-04 ENCOUNTER — Encounter: Admit: 2018-09-04 | Discharge: 2018-09-04 | Payer: PRIVATE HEALTH INSURANCE

## 2018-09-04 ENCOUNTER — Ambulatory Visit: Admit: 2018-09-04 | Discharge: 2018-09-05 | Payer: BC Managed Care – PPO

## 2018-09-04 ENCOUNTER — Encounter: Admit: 2018-09-04 | Discharge: 2018-09-04

## 2018-09-04 DIAGNOSIS — E785 Hyperlipidemia, unspecified: ICD-10-CM

## 2018-09-04 DIAGNOSIS — F329 Major depressive disorder, single episode, unspecified: ICD-10-CM

## 2018-09-04 DIAGNOSIS — I499 Cardiac arrhythmia, unspecified: ICD-10-CM

## 2018-09-04 DIAGNOSIS — G4730 Sleep apnea, unspecified: ICD-10-CM

## 2018-09-04 DIAGNOSIS — Q615 Medullary cystic kidney: ICD-10-CM

## 2018-09-04 DIAGNOSIS — Z8659 Personal history of other mental and behavioral disorders: ICD-10-CM

## 2018-09-04 DIAGNOSIS — F4310 Post-traumatic stress disorder, unspecified: ICD-10-CM

## 2018-09-04 DIAGNOSIS — E039 Hypothyroidism, unspecified: ICD-10-CM

## 2018-09-04 DIAGNOSIS — F99 Mental disorder, not otherwise specified: ICD-10-CM

## 2018-09-04 DIAGNOSIS — N390 Urinary tract infection, site not specified: ICD-10-CM

## 2018-09-04 DIAGNOSIS — R000 Tachycardia, unspecified: ICD-10-CM

## 2018-09-05 ENCOUNTER — Ambulatory Visit: Admit: 2018-09-05 | Discharge: 2018-09-05 | Payer: BC Managed Care – PPO

## 2018-09-05 DIAGNOSIS — D352 Benign neoplasm of pituitary gland: ICD-10-CM

## 2018-09-05 DIAGNOSIS — G4733 Obstructive sleep apnea (adult) (pediatric): ICD-10-CM

## 2018-09-05 DIAGNOSIS — I441 Atrioventricular block, second degree: ICD-10-CM

## 2018-09-05 DIAGNOSIS — F502 Bulimia nervosa: Principal | ICD-10-CM

## 2018-09-05 DIAGNOSIS — R7989 Other specified abnormal findings of blood chemistry: ICD-10-CM

## 2018-09-05 DIAGNOSIS — E785 Hyperlipidemia, unspecified: ICD-10-CM

## 2018-09-05 DIAGNOSIS — Z789 Other specified health status: Secondary | ICD-10-CM

## 2018-09-05 DIAGNOSIS — E039 Hypothyroidism, unspecified: ICD-10-CM

## 2018-09-05 DIAGNOSIS — R002 Palpitations: ICD-10-CM

## 2018-09-05 LAB — ESTRADIOL (E2): Lab: <15 pg/mL

## 2018-09-05 LAB — LUTEINIZING HORMONE: Lab: 27 mU/mL

## 2018-09-05 LAB — 25-OH VITAMIN D (D2 + D3): Lab: 38 ng/mL (ref 30–80)

## 2018-09-05 LAB — ACTH: Lab: 21 pg/mL (ref 7–63)

## 2018-09-05 LAB — THYROID STIMULATING HORMONE-TSH: Lab: 0 uU/mL — ABNORMAL LOW (ref 0.35–5.00)

## 2018-09-05 LAB — FOLLICLE STIMULATING HORMONE: Lab: 68 mU/mL

## 2018-09-05 LAB — CORTISOL-AM: Lab: 12 ug/dL (ref 6.7–22.6)

## 2018-09-05 LAB — FREE T4 (FREE THYROXINE) ONLY: Lab: 1 ng/dL (ref 0.6–1.6)

## 2018-09-05 LAB — PROLACTIN: Lab: 6.6 ng/mL (ref 3.3–26.7)

## 2018-09-07 ENCOUNTER — Encounter: Admit: 2018-09-07 | Discharge: 2018-09-07 | Payer: PRIVATE HEALTH INSURANCE

## 2018-09-08 LAB — SOMATOMEDIN C, IGF-1
Lab: 0.1
Lab: 107

## 2018-09-12 ENCOUNTER — Encounter: Admit: 2018-09-12 | Discharge: 2018-09-13 | Payer: BC Managed Care – PPO

## 2018-09-12 ENCOUNTER — Encounter: Admit: 2018-09-12 | Discharge: 2018-09-12 | Payer: PRIVATE HEALTH INSURANCE

## 2018-09-12 NOTE — Progress Notes
Patient arrived to COVID clinic for COVID-19 testing 09/12/18 1311. Patient identity confirmed via photo I.D. Nasopharyngeal procedure explained to the patient.   Nasopharyngeal swab completed right  Patient education provided given and instructed patient self isolate until contacted w/ results and further instructions.   Swab collected by Marrianne Mood, RN.    Date symptoms began/reason for testing: 09/11/2018, muscle ache, congestion, fatigue and nausea

## 2018-09-13 ENCOUNTER — Encounter: Admit: 2018-09-13 | Discharge: 2018-09-13

## 2018-09-13 DIAGNOSIS — Z1159 Encounter for screening for other viral diseases: ICD-10-CM

## 2018-09-13 DIAGNOSIS — J069 Acute upper respiratory infection, unspecified: Principal | ICD-10-CM

## 2018-09-13 LAB — COVID-19 (SARS-COV-2) PCR

## 2018-09-14 ENCOUNTER — Encounter: Admit: 2018-09-14 | Discharge: 2018-09-14 | Payer: PRIVATE HEALTH INSURANCE

## 2018-09-14 ENCOUNTER — Encounter: Admit: 2018-09-14 | Discharge: 2018-09-14

## 2018-09-14 MED FILL — MONTELUKAST 10 MG PO TAB: 10 mg | ORAL | 30 days supply | Qty: 30 | Fill #4 | Status: AC

## 2018-09-18 ENCOUNTER — Encounter: Admit: 2018-09-18 | Discharge: 2018-09-18 | Payer: PRIVATE HEALTH INSURANCE

## 2018-09-19 ENCOUNTER — Encounter: Admit: 2018-09-19 | Discharge: 2018-09-19 | Payer: PRIVATE HEALTH INSURANCE

## 2018-09-19 MED ORDER — FLUOXETINE 20 MG PO CAP
60 mg | ORAL_CAPSULE | Freq: Every day | ORAL | 1 refills | Status: DC
Start: 2018-09-19 — End: 2018-11-27
  Filled 2018-09-19: qty 90, 30d supply

## 2018-09-21 ENCOUNTER — Encounter: Admit: 2018-09-21 | Discharge: 2018-09-21

## 2018-09-21 ENCOUNTER — Encounter: Admit: 2018-09-21 | Discharge: 2018-09-21 | Payer: PRIVATE HEALTH INSURANCE

## 2018-09-21 MED FILL — FLUOXETINE 20 MG PO CAP: 20 mg | ORAL | 30 days supply | Qty: 90 | Fill #1 | Status: AC

## 2018-09-25 ENCOUNTER — Encounter: Admit: 2018-09-25 | Discharge: 2018-09-25 | Payer: PRIVATE HEALTH INSURANCE

## 2018-09-25 MED ORDER — LATUDA 20 MG PO TAB
20 mg | ORAL_TABLET | Freq: Every day | ORAL | 1 refills | Status: DC
Start: 2018-09-25 — End: 2018-11-13
  Filled 2018-09-25: qty 30, 30d supply
  Filled 2018-09-25 (×2): qty 30, 30d supply, fill #1

## 2018-09-26 ENCOUNTER — Ambulatory Visit: Admit: 2018-09-26 | Discharge: 2018-09-26 | Payer: BC Managed Care – PPO

## 2018-09-26 ENCOUNTER — Encounter: Admit: 2018-09-26 | Discharge: 2018-09-26

## 2018-09-26 ENCOUNTER — Encounter: Admit: 2018-09-26 | Discharge: 2018-09-26 | Payer: PRIVATE HEALTH INSURANCE

## 2018-09-26 DIAGNOSIS — R000 Tachycardia, unspecified: ICD-10-CM

## 2018-09-26 DIAGNOSIS — I499 Cardiac arrhythmia, unspecified: ICD-10-CM

## 2018-09-26 DIAGNOSIS — E039 Hypothyroidism, unspecified: ICD-10-CM

## 2018-09-26 DIAGNOSIS — Z8659 Personal history of other mental and behavioral disorders: ICD-10-CM

## 2018-09-26 DIAGNOSIS — G4730 Sleep apnea, unspecified: ICD-10-CM

## 2018-09-26 DIAGNOSIS — F4310 Post-traumatic stress disorder, unspecified: ICD-10-CM

## 2018-09-26 DIAGNOSIS — Q615 Medullary cystic kidney: ICD-10-CM

## 2018-09-26 DIAGNOSIS — E785 Hyperlipidemia, unspecified: ICD-10-CM

## 2018-09-26 DIAGNOSIS — N390 Urinary tract infection, site not specified: ICD-10-CM

## 2018-09-26 DIAGNOSIS — F99 Mental disorder, not otherwise specified: ICD-10-CM

## 2018-09-26 DIAGNOSIS — Z780 Asymptomatic menopausal state: ICD-10-CM

## 2018-09-26 DIAGNOSIS — N951 Menopausal and female climacteric states: Principal | ICD-10-CM

## 2018-09-26 DIAGNOSIS — F329 Major depressive disorder, single episode, unspecified: ICD-10-CM

## 2018-09-26 MED ORDER — ESTRADIOL 0.05 MG/24 HR TD PTSW
1 | MEDICATED_PATCH | TRANSDERMAL | 11 refills | 30.00000 days | Status: DC
Start: 2018-09-26 — End: 2018-12-11
  Filled 2018-09-27 (×2): qty 8, 28d supply, fill #1

## 2018-09-27 ENCOUNTER — Encounter: Admit: 2018-09-27 | Discharge: 2018-09-27 | Payer: PRIVATE HEALTH INSURANCE

## 2018-09-27 MED FILL — CLONAZEPAM 1 MG PO TAB: 1 mg | 30 days supply | Qty: 60 | Fill #2 | Status: CP

## 2018-10-05 ENCOUNTER — Encounter: Admit: 2018-10-05 | Discharge: 2018-10-05 | Payer: PRIVATE HEALTH INSURANCE

## 2018-10-13 ENCOUNTER — Encounter: Admit: 2018-10-13 | Discharge: 2018-10-13

## 2018-10-14 ENCOUNTER — Encounter: Admit: 2018-10-14 | Discharge: 2018-10-14

## 2018-10-14 NOTE — Progress Notes
Request for the following medical records for purpose of continuity of care:   Has an appointment with LDB on 06/09    Please send most recent lab results.    Please Fax to:  [facility] - [phone]  Dr. Artist Beach  Attention:  Barbie Haggis, RN    Thank you

## 2018-10-15 ENCOUNTER — Encounter: Admit: 2018-10-15 | Discharge: 2018-10-15

## 2018-10-16 ENCOUNTER — Encounter: Admit: 2018-10-16 | Discharge: 2018-10-16

## 2018-10-16 MED FILL — LATUDA 20 MG PO TAB: 20 mg | ORAL | 30 days supply | Qty: 30 | Fill #2 | Status: AC

## 2018-10-16 MED FILL — MONTELUKAST 10 MG PO TAB: 10 mg | ORAL | 30 days supply | Qty: 30 | Fill #5 | Status: AC

## 2018-10-16 MED FILL — FLUOXETINE 20 MG PO CAP: 20 mg | ORAL | 30 days supply | Qty: 90 | Fill #2 | Status: AC

## 2018-10-17 ENCOUNTER — Ambulatory Visit: Admit: 2018-10-17 | Discharge: 2018-10-18

## 2018-10-17 ENCOUNTER — Encounter: Admit: 2018-10-17 | Discharge: 2018-10-17

## 2018-10-17 DIAGNOSIS — I441 Atrioventricular block, second degree: Secondary | ICD-10-CM

## 2018-10-17 DIAGNOSIS — G4730 Sleep apnea, unspecified: Secondary | ICD-10-CM

## 2018-10-17 DIAGNOSIS — I499 Cardiac arrhythmia, unspecified: Secondary | ICD-10-CM

## 2018-10-17 DIAGNOSIS — E785 Hyperlipidemia, unspecified: Secondary | ICD-10-CM

## 2018-10-17 DIAGNOSIS — F99 Mental disorder, not otherwise specified: Secondary | ICD-10-CM

## 2018-10-17 DIAGNOSIS — F4310 Post-traumatic stress disorder, unspecified: Secondary | ICD-10-CM

## 2018-10-17 DIAGNOSIS — Z8659 Personal history of other mental and behavioral disorders: Secondary | ICD-10-CM

## 2018-10-17 DIAGNOSIS — Q615 Medullary cystic kidney: Secondary | ICD-10-CM

## 2018-10-17 DIAGNOSIS — N390 Urinary tract infection, site not specified: Secondary | ICD-10-CM

## 2018-10-17 DIAGNOSIS — E039 Hypothyroidism, unspecified: Secondary | ICD-10-CM

## 2018-10-17 DIAGNOSIS — R002 Palpitations: Secondary | ICD-10-CM

## 2018-10-17 DIAGNOSIS — F329 Major depressive disorder, single episode, unspecified: Secondary | ICD-10-CM

## 2018-10-17 DIAGNOSIS — R000 Tachycardia, unspecified: Secondary | ICD-10-CM

## 2018-10-17 MED ORDER — CETIRIZINE 5 MG PO TAB
5 mg | ORAL_TABLET | Freq: Every morning | ORAL | 3 refills | Status: DC
Start: 2018-10-17 — End: 2019-03-29

## 2018-10-18 DIAGNOSIS — I493 Ventricular premature depolarization: Principal | ICD-10-CM

## 2018-10-24 ENCOUNTER — Encounter: Admit: 2018-10-24 | Discharge: 2018-10-24

## 2018-10-25 ENCOUNTER — Encounter: Admit: 2018-10-25 | Discharge: 2018-10-25

## 2018-10-25 MED FILL — MOMETASONE 50 MCG/ACTUATION NA SPRY: 50 mcg/actuation | NASAL | 30 days supply | Qty: 17 | Fill #4 | Status: CP

## 2018-10-25 MED FILL — ESTRADIOL 0.05 MG/24 HR TD PTSW: 0.05 mg/24 hr | TRANSDERMAL | 28 days supply | Qty: 8 | Fill #2 | Status: CP

## 2018-10-25 MED FILL — AZELASTINE 137 MCG (0.1 %) NA SPRA: 137 mcg (0.1 %) | NASAL | 25 days supply | Qty: 30 | Status: CN

## 2018-10-26 ENCOUNTER — Encounter: Admit: 2018-10-26 | Discharge: 2018-10-26

## 2018-10-30 ENCOUNTER — Encounter: Admit: 2018-10-30 | Discharge: 2018-10-30

## 2018-10-30 MED ORDER — TIROSINT 137 MCG PO CAP
137 ug | ORAL_CAPSULE | Freq: Every day | ORAL | 0 refills | 30.00000 days | Status: DC
Start: 2018-10-30 — End: 2019-03-19
  Filled 2018-10-30: qty 60, 60d supply

## 2018-10-30 MED ORDER — LEVOTHYROXINE 137 MCG PO TAB
137 ug | ORAL_TABLET | Freq: Every day | ORAL | 2 refills | 30.00000 days | Status: DC
Start: 2018-10-30 — End: 2019-03-19
  Filled 2018-11-03: qty 60, 60d supply, fill #1

## 2018-10-31 ENCOUNTER — Encounter: Admit: 2018-10-31 | Discharge: 2018-10-31

## 2018-11-02 ENCOUNTER — Encounter: Admit: 2018-11-02 | Discharge: 2018-11-02

## 2018-11-03 ENCOUNTER — Encounter: Admit: 2018-11-03 | Discharge: 2018-11-03

## 2018-11-06 ENCOUNTER — Encounter: Admit: 2018-11-06 | Discharge: 2018-11-06

## 2018-11-06 ENCOUNTER — Ambulatory Visit: Admit: 2018-11-06 | Discharge: 2018-11-07

## 2018-11-06 DIAGNOSIS — I493 Ventricular premature depolarization: Secondary | ICD-10-CM

## 2018-11-06 DIAGNOSIS — I441 Atrioventricular block, second degree: Secondary | ICD-10-CM

## 2018-11-07 ENCOUNTER — Encounter: Admit: 2018-11-07 | Discharge: 2018-11-07

## 2018-11-07 DIAGNOSIS — R002 Palpitations: Secondary | ICD-10-CM

## 2018-11-07 NOTE — Progress Notes
Holter Placement Record  Brand: Bio-Tel(CardioNet)  Ordering Physician: LDB   Diagnosis: PALPITATIONS  Length: 72  Serial Number: [id number]  Start Time: ???  Location where Holter was placed: placed in clinic on 10/17/18  Will Holter be returned by mail? Yes     Placed in clinic       Was sent back to bio tel with no enrollment.

## 2018-11-08 ENCOUNTER — Encounter: Admit: 2018-11-08 | Discharge: 2018-11-08

## 2018-11-08 MED ORDER — ROSUVASTATIN 10 MG PO TAB
10 mg | ORAL_TABLET | Freq: Every day | ORAL | 0 refills
Start: 2018-11-08 — End: ?

## 2018-11-08 MED ORDER — ROSUVASTATIN 10 MG PO TAB
10 mg | ORAL_TABLET | Freq: Every day | ORAL | 1 refills | 90.00000 days | Status: DC
Start: 2018-11-08 — End: 2019-01-05
  Filled 2018-11-08: qty 90, 90d supply

## 2018-11-08 MED FILL — ARMODAFINIL 250 MG PO TAB: 250 mg | ORAL | 30 days supply | Qty: 30 | Status: CN

## 2018-11-09 ENCOUNTER — Encounter: Admit: 2018-11-09 | Discharge: 2018-11-09

## 2018-11-09 ENCOUNTER — Encounter: Admit: 2018-11-09 | Discharge: 2018-11-10

## 2018-11-09 ENCOUNTER — Ambulatory Visit: Admit: 2018-12-15 | Discharge: 2018-12-16

## 2018-11-09 DIAGNOSIS — K5900 Constipation, unspecified: Principal | ICD-10-CM

## 2018-11-09 DIAGNOSIS — Z1159 Encounter for screening for other viral diseases: Secondary | ICD-10-CM

## 2018-11-09 DIAGNOSIS — R109 Unspecified abdominal pain: Secondary | ICD-10-CM

## 2018-11-09 MED ORDER — CLONAZEPAM 1 MG PO TAB
ORAL_TABLET | 1 refills
Start: 2018-11-09 — End: ?

## 2018-11-09 MED ORDER — CLONAZEPAM 1 MG PO TAB
.5 mg | ORAL_TABLET | Freq: Four times a day (QID) | ORAL | 1 refills | 30.00000 days | Status: DC
Start: 2018-11-09 — End: 2018-12-14
  Filled 2018-11-09: qty 60, 30d supply

## 2018-11-09 NOTE — Progress Notes
Patient arrived to [facility] for COVID-19 testing 11/09/18 1200. Patient identity confirmed via photo I.D. Nasopharyngeal procedure explained to the patient.   Nasopharyngeal swab completed right  Patient education provided given and instructed patient self isolate until contacted w/ results and further instructions.   Swab collected by Burney Gauze, CNA.    Date symptoms began/reason for testing: 11/06/2018 muscle aches, ABD pain, chills, congestions, fatigue    Duard Brady, BSN, RN

## 2018-11-10 LAB — COVID-19 (SARS-COV-2) PCR

## 2018-11-11 NOTE — Progress Notes
Patient had viewed results in MyChart. Additional MyChart message sent to patient.

## 2018-11-12 ENCOUNTER — Encounter: Admit: 2018-11-12 | Discharge: 2018-11-12

## 2018-11-12 MED FILL — ARMODAFINIL 250 MG PO TAB: 250 mg | ORAL | 30 days supply | Qty: 30 | Fill #2 | Status: AC

## 2018-11-12 MED FILL — CLONAZEPAM 1 MG PO TAB: 1 mg | ORAL | 30 days supply | Qty: 60 | Fill #1 | Status: AC

## 2018-11-12 MED FILL — ROSUVASTATIN 10 MG PO TAB: 10 mg | ORAL | 90 days supply | Qty: 90 | Fill #1 | Status: AC

## 2018-11-13 ENCOUNTER — Encounter: Admit: 2018-11-13 | Discharge: 2018-11-13

## 2018-11-13 DIAGNOSIS — E785 Hyperlipidemia, unspecified: Secondary | ICD-10-CM

## 2018-11-13 DIAGNOSIS — I499 Cardiac arrhythmia, unspecified: Secondary | ICD-10-CM

## 2018-11-13 DIAGNOSIS — F329 Major depressive disorder, single episode, unspecified: Secondary | ICD-10-CM

## 2018-11-13 DIAGNOSIS — F99 Mental disorder, not otherwise specified: Secondary | ICD-10-CM

## 2018-11-13 DIAGNOSIS — F4310 Post-traumatic stress disorder, unspecified: Secondary | ICD-10-CM

## 2018-11-13 DIAGNOSIS — R000 Tachycardia, unspecified: Secondary | ICD-10-CM

## 2018-11-13 DIAGNOSIS — E039 Hypothyroidism, unspecified: Secondary | ICD-10-CM

## 2018-11-13 DIAGNOSIS — G4730 Sleep apnea, unspecified: Secondary | ICD-10-CM

## 2018-11-13 DIAGNOSIS — N390 Urinary tract infection, site not specified: Secondary | ICD-10-CM

## 2018-11-13 DIAGNOSIS — Q615 Medullary cystic kidney: Secondary | ICD-10-CM

## 2018-11-13 DIAGNOSIS — Z8659 Personal history of other mental and behavioral disorders: Secondary | ICD-10-CM

## 2018-11-13 MED ORDER — LURASIDONE 20 MG PO TAB
20 mg | ORAL_TABLET | Freq: Every day | ORAL | 1 refills | 30.00000 days | Status: DC
Start: 2018-11-13 — End: 2018-12-14
  Filled 2018-11-13: qty 30, 30d supply, fill #1

## 2018-11-13 NOTE — Telephone Encounter
Attempted to contact patient prior to Telehealth appointment.  Patient didnt answer left voicemail with callback number.

## 2018-11-15 ENCOUNTER — Encounter: Admit: 2018-11-15 | Discharge: 2018-11-15

## 2018-11-17 ENCOUNTER — Ambulatory Visit: Admit: 2018-11-17 | Discharge: 2018-11-18

## 2018-11-17 DIAGNOSIS — J309 Allergic rhinitis, unspecified: Secondary | ICD-10-CM

## 2018-11-17 NOTE — Progress Notes
Telehealth Visit Note    Date of Service: 11/17/2018    Subjective:      Obtained patient's verbal consent to treat them and their agreement to [facility] financial policy and NPP via this telehealth visit during the [facility] Emergency       Holly Maldonado is a 58 y.o. female who presents for follow up ARC.    History of Present Illness    She's doing well.    She's compliant with Singulair 10mg  qHS, Nasonex 2p en daily, Astelin 1-2p en BID PRN, and patanol 1 dr Nolon Bussing eye BID PRN, used some recently.  Dr. Carolan Shiver changed her Zyrtec-D to plain Zyrtec, she felt more relieved with Zyrtec-D but he preferred to avoid decongestants with her h/o PVC's.      She's not interested in AIT.  She's satisfied with control of ARC on the above medications and doesn't wish to make changes.   Medications are tolerated without unacceptable side effects.       Review of Systems   Eyes: Positive for discharge and itching.         Objective:         ??? armodafiniL (NUVIGIL) 250 mg tablet Take one tablet by mouth daily.   ??? azelastine (ASTELIN) 137 mcg (0.1 %) nasal spray Apply one spray to two sprays to each nostril as directed twice daily as needed.   ??? cetirizine (ZYRTEC) 5 mg tablet Take one tablet by mouth every morning.   ??? Cholecalciferol (Vitamin D3) 1,000 unit cap Take 1,000 Units by mouth.   ??? clonazePAM (KLONOPIN) 0.5 mg tablet Take 1 tablet by mouth up to 4 daily as needed for anxiety.   ??? clonazePAM (KLONOPIN) 0.5 mg tablet Take 0.5 mg by mouth twice daily.   ??? clonazePAM (KLONOPIN) 1 mg tablet Take one-half tablet by mouth four times daily as needed.   ??? docosahexaenoic acid/epa (FISH OIL PO) Take  by mouth.   ??? estradioL (VIVELLE-DOT) 0.05 mg/24 hr patch Apply one patch to top of skin as directed twice weekly.   ??? FLUoxetine (PROZAC) 20 mg capsule Take three capsules by mouth daily as directed.   ??? fluoxetine(+) (PROZAC) 40 mg capsule Take 60 mg by mouth daily. ??? levothyroxine (SYNTHROID) 137 mcg tablet Take one tablet by mouth daily.   ??? levothyroxine (SYNTHROID) 150 mcg tablet Take one tablet by mouth daily   ??? levothyroxine (TIROSINT) 137 mcg capsule Take one capsule by mouth daily.   ??? lurasidone (LATUDA) 20 mg tablet Take 1 tablet by mouth daily with food   ??? magnesium oxide (MAG-OX) 400 mg tablet Take 400 mg by mouth daily.   ??? mometasone (NASONEX) 50 mcg/actuation nasal spray Apply one spray to each nostril as directed twice daily.   ??? montelukast (SINGULAIR) 10 mg tablet Take one tablet by mouth at bedtime daily.   ??? Multivitamins with Fluoride (MULTI-VITAMIN PO) Take  by mouth.   ??? olopatadine (PATANOL) 0.1 % ophthalmic solution Place one drop into eye(s) twice daily as needed for Allergy symptoms.   ??? rosuvastatin (CRESTOR) 10 mg tablet Take one tablet by mouth daily.     There were no vitals filed for this visit.  There is no height or weight on file to calculate BMI.     Physical Exam         Assessment and Plan:    Problem   Allergic Rhinitis    12-12-17 total IgE within normal limits   IgE  class II or greater to: 2 dust mite species  05-25-18 aeroallergen SPT positive to 2 cockroach species, 2 dust mite species, and 2 molds with appropriate controls  05-25-18 aeroallergen ID positive to 2 trees, 2 grasses, 1 weed, cat, dog, 1 mold with appropriate controls    Aeroallergen avoidance tips discussed and provided written copy  Continue saline sinus rinses daily using either boiled-then-cooled tap water or distilled water to mix with saline packets.  Continue Nasonex 2 sp en BID  Continue Astelin 1-2 sp en BID PRN rhinorrhea, PND, sneezing, or nasal itching  Continue Patanol 1 dr Nolon Bussing eye BID PRN itchy/watery eyes  Continue Zyrtec 10mg  daily  Continue singulair 10 mg once a day. Potential adverse effects of medications were discussed and all questions were answered to the best of my ability.     She previously preferred to leave c-ANCA/p-ANCA and anti-MPO/anti-PR3 antibodies on the back burner.    We discussed aeroallergen immunotherapy including the procedure, potential long term benefits, frequency of office visits needed during build up phase, potential slow onset of benefit, potential long duration of therapy/time commitment, cost, and potential risks including life-threatening anaphylaxis. We discussed the recommendation to only get injections in a physician's office setting equipped to manage anaphylaxis should it occur, and the recommendation to carry autoinjectable epinephrine to all injection appointments.  She will contact us if she wishes to pursue aero allergen immunotherapy in the future.              Return in about 6 months (around 05/20/2019).           4 minutes spent on this patient's encounter with counseling and coordination of care taking >50% of the visit.      Thank you for allowing [name] to participate in the care of this patient.??? Please feel free to contact us if there are any questions or concerns about the patient.    [name], MD  Assistant Professor  Division of Allergy, Immunology, and Rheumatology  Department of Medicine and Department of Pediatrics  [facility]

## 2018-11-21 ENCOUNTER — Encounter: Admit: 2018-11-21 | Discharge: 2018-11-21

## 2018-11-24 ENCOUNTER — Encounter: Admit: 2018-11-24 | Discharge: 2018-11-24

## 2018-11-24 MED ORDER — PROGESTERONE MICRONIZED 100 MG PO CAP
100 mg | ORAL_CAPSULE | Freq: Every evening | ORAL | PRN refills | Status: DC
Start: 2018-11-24 — End: 2019-03-19
  Filled 2018-11-24: qty 90, 90d supply, fill #1

## 2018-11-26 ENCOUNTER — Encounter: Admit: 2018-11-26 | Discharge: 2018-11-26

## 2018-11-27 ENCOUNTER — Encounter: Admit: 2018-11-27 | Discharge: 2018-11-27

## 2018-11-27 MED ORDER — FLUOXETINE 20 MG PO CAP
60 mg | ORAL_CAPSULE | Freq: Every day | ORAL | 2 refills | 30.00000 days | Status: DC
Start: 2018-11-27 — End: 2018-12-14
  Filled 2018-11-30: qty 90, 30d supply, fill #1

## 2018-11-27 MED ORDER — FLUOXETINE 20 MG PO CAP
60 mg | ORAL_CAPSULE | Freq: Every day | ORAL | 1 refills
Start: 2018-11-27 — End: ?

## 2018-11-27 MED FILL — MOMETASONE 50 MCG/ACTUATION NA SPRY: 50 mcg/actuation | NASAL | 30 days supply | Qty: 17 | Fill #5 | Status: AC

## 2018-11-27 MED FILL — MONTELUKAST 10 MG PO TAB: 10 mg | ORAL | 30 days supply | Qty: 30 | Fill #6 | Status: AC

## 2018-11-29 ENCOUNTER — Encounter: Admit: 2018-11-29 | Discharge: 2018-11-29

## 2018-12-06 ENCOUNTER — Encounter: Admit: 2018-12-06 | Discharge: 2018-12-06

## 2018-12-06 ENCOUNTER — Ambulatory Visit: Admit: 2018-12-06 | Discharge: 2018-12-06

## 2018-12-06 DIAGNOSIS — I441 Atrioventricular block, second degree: Secondary | ICD-10-CM

## 2018-12-06 DIAGNOSIS — F502 Bulimia nervosa: Principal | ICD-10-CM

## 2018-12-06 DIAGNOSIS — G4733 Obstructive sleep apnea (adult) (pediatric): Secondary | ICD-10-CM

## 2018-12-06 DIAGNOSIS — R7989 Other specified abnormal findings of blood chemistry: Secondary | ICD-10-CM

## 2018-12-06 DIAGNOSIS — D352 Benign neoplasm of pituitary gland: Secondary | ICD-10-CM

## 2018-12-06 DIAGNOSIS — E785 Hyperlipidemia, unspecified: Secondary | ICD-10-CM

## 2018-12-06 DIAGNOSIS — E039 Hypothyroidism, unspecified: Secondary | ICD-10-CM

## 2018-12-06 DIAGNOSIS — R002 Palpitations: Secondary | ICD-10-CM

## 2018-12-06 DIAGNOSIS — Z789 Other specified health status: Secondary | ICD-10-CM

## 2018-12-06 MED ORDER — GADOBENATE DIMEGLUMINE 529 MG/ML (0.1MMOL/0.2ML) IV SOLN
16 mL | Freq: Once | INTRAVENOUS | 0 refills | Status: CP
Start: 2018-12-06 — End: ?
  Administered 2018-12-06: 16:00:00 16 mL via INTRAVENOUS

## 2018-12-06 MED ORDER — SODIUM CHLORIDE 0.9 % IJ SOLN
50 mL | Freq: Once | INTRAVENOUS | 0 refills | Status: CP
Start: 2018-12-06 — End: ?
  Administered 2018-12-06: 16:00:00 50 mL via INTRAVENOUS

## 2018-12-07 ENCOUNTER — Encounter: Admit: 2018-12-07 | Discharge: 2018-12-07

## 2018-12-07 NOTE — Telephone Encounter
Good news the MRI of the brain is stable.     There are no concerning features.    Rhonda to call

## 2018-12-07 NOTE — Telephone Encounter
Called Patient relayed message in VM as permitted by HIPPA  Advised Patient to call if has questions

## 2018-12-11 ENCOUNTER — Encounter: Admit: 2018-12-11 | Discharge: 2018-12-11

## 2018-12-11 DIAGNOSIS — F329 Major depressive disorder, single episode, unspecified: Secondary | ICD-10-CM

## 2018-12-11 DIAGNOSIS — G4730 Sleep apnea, unspecified: Secondary | ICD-10-CM

## 2018-12-11 DIAGNOSIS — I499 Cardiac arrhythmia, unspecified: Secondary | ICD-10-CM

## 2018-12-11 DIAGNOSIS — N390 Urinary tract infection, site not specified: Secondary | ICD-10-CM

## 2018-12-11 DIAGNOSIS — F99 Mental disorder, not otherwise specified: Secondary | ICD-10-CM

## 2018-12-11 DIAGNOSIS — E039 Hypothyroidism, unspecified: Secondary | ICD-10-CM

## 2018-12-11 DIAGNOSIS — F4310 Post-traumatic stress disorder, unspecified: Secondary | ICD-10-CM

## 2018-12-11 DIAGNOSIS — E785 Hyperlipidemia, unspecified: Secondary | ICD-10-CM

## 2018-12-11 DIAGNOSIS — Z1159 Encounter for screening for other viral diseases: Secondary | ICD-10-CM

## 2018-12-11 DIAGNOSIS — Z8659 Personal history of other mental and behavioral disorders: Secondary | ICD-10-CM

## 2018-12-11 DIAGNOSIS — R000 Tachycardia, unspecified: Secondary | ICD-10-CM

## 2018-12-11 DIAGNOSIS — Q615 Medullary cystic kidney: Secondary | ICD-10-CM

## 2018-12-11 MED ORDER — PEG-ELECTROLYTE SOLN 420 GRAM PO SOLR
0 refills | Status: DC
Start: 2018-12-11 — End: 2019-03-19
  Filled 2018-12-12: qty 4000, 1d supply, fill #1

## 2018-12-11 NOTE — Pre-Anesthesia Patient Instructions
SPLIT DOSE NULYTELY/GOLYTELY PREP  PATIENT PREPARATION INSTRUCTIONS??? COLONOSCOPY      Tobey Grim are scheduled for a Colonoscopy on: 12/15/2018  at:  1:15 PM   With Dr. Francisco Capuchin, Stevphen Meuse, MD  You must arrive by:  12:15pm       ** Please keep in mind that our surgery schedule is fluid, and affected by multiple factors, so it is possible that your procedure time could change.  We will make every effort to maintain your original procedure time, but there are no guarantees. Thank you for your patience and understanding!  **      OUR ADDRESS IS [address] [place],  [address]  WE ARE LOCATED AT JUST OFF [address]   WE ARE ON [place] [address] [place] [address]  ONCE YOU'RE ON THE CAMPUS FOLLOW THE SIGNS TO THE SURGERY CENTER LOCATED ON THE EAST SIDE OF THE CAMPUS PARK AND ENTER THROUGH THE SURGERY CENTER DOORS.    To Reschedule call: [phone]  For Questions call: [phone]    ____________________________________________________________________________________    A colonoscopy is a scope examination of your colon or large intestine.  This is about (6) feet long in most adults.                         - A thin, flexible scope with a light and lens will be inserted through your rectum into your colon.  - The doctor will guide the scope along your colon to the junction of the large and small intestines.  - The test usually takes (30) minutes.  - Unless the test is prescribed for other reasons, the main purpose of the colonoscopy is to search for polyps.  - A colonoscopy can be used to screen for cancer or precancerous polyps.  It can also be used to assess the large intestine for various other diseases.  - If biopsies are obtained during the procedure, you will be notified of the results in (14) days.  - We will sedate you for the test by giving you medicines through an IV that will help you to relax and sleep.  We will keep you after the exam for (30) minutes. - The doctor will speak to you before and after the procedure.  - Unless you request otherwise, we will invite your driver to the recovery room to hear the doctor's post-exam report.  - The colon must be completely clean for the procedure to be both accurate and comprehensive. Please follow the preparation instructions carefully.  _________________________________________________________________________________________________________________    Instructions: Fill your prescription at least (3) days before your scheduled procedure time.    1 WEEK PRIOR:   1. Stop taking iron supplements (including multivitamins containing iron.)   2. Do not eat any foods containing nuts, seeds, or kernels (for example: sunflower seeds or popcorn).  3. Discuss diabetic medications and insulin with the prescribing physician.     5 DAYS PRIOR:  1. Check with your prescribing physician for instructions about stopping your blood thinner.  Examples of blood thinners are Aleve, Aspirin, Coumadin, Eliquis, Ibuprofen, Naproxen, Plavix and Xarelto.  2. Do not give yourself a Lovenox injection the morning of the test.  Lovenox injections may be taken as usual the day before the test.    4 DAYS PRIOR  1. Stop taking Metamucil, Perdiem, Citrucel, or any other bulk laxatives. Miralax is okay.    DAY PRIOR:  1. Beginning in the morning, start a clear liquid diet. Drink a generous amount of clear liquids throughout the day, no alcohol.  If you are on a fluid restriction, drink the recommended amount of clear liquids allowed by your physician. No solid or creamed/pureed foods.    Clear Liquid Diet (Avoid all items with RED, PURPLE or ORANGE coloring)  *Water  *Gatorade or sports drink- No red, purple or orange     *White Grape Juice    *Coffee or Tea without Cream  *Tea     *Apple Juice     *Soda Pop (all are ok)   *Green or Yellow Popsicles   *Beef Bouillon or Broth (No Noodles)  *Chicken Bouillon or Broth (No Noodles) *Hard Candy Special educational needs teacher, Life Saver, Mint or Gum)  *Jello (please no fruit in the jello or red, purple or orange in color)    2. At 11:00 A.M. fill the Nulytely/Golytely jug to the ???fill??? line with lukewarm drinking water. Cap the jug and shake to dissolve the powder.  Place the jug in the refrigerator.  3. At 5:00 P.M. drink one 8-ounce glass of Nulytely/Golytely and repeat every (10-15) minutes until you have finished the first (???) gallon of Nulytely/Golytely (3) liters. If you become nauseated hold off on drinking for (30) minutes or so, then resume.    DAY OF TEST:  1. Continue clear liquid diet  2. Five hours before your scheduled procedure time drink the remaining Nulytely/Golytely 8-ounces at a time. Repeat every (10-15) minutes until you have finished.   3. You may drink clear liquids up until (4) hours before your scheduled procedure time. After this you should have nothing by mouth. This includes GUM or CANDY.   a. Chewing tobacco must be stopped (6) hours before your scheduled procedure.   b. If you have an early morning test, take ONLY your essential morning medications (heart, blood pressure, seizure, etc.) with a small sip of water.  4. Bathe, brush teeth and gargle the morning of your procedure.   5. Wear comfortable, casual, loose fitting clothing that are easy to get on and off.  6. Take off any jewelry, take out any piercings, leave all valuables at home.  7. If you wear glasses or contacts, please bring a case for their safekeeping.   8. You will be sedated for the procedure. A responsible adult must drive you home (no Benedetto Goad, taxis or buses are allowed) and we ask that your ride stay at the facility until your procedure is finished. If you do not have a driver we will be unable to do the test.  9. Please plan to be here approximately 2 hours and encourage your driver to be within 20 minutes of the facility.   10. You will not be able to return to work the same day if you have received sedation.   11. Please bring a list of your current medications and dosages with you.  12. Please bring your ID and insurance card along with any required copay/deductible.  13. Please wear a mask and have your driver do the same (it does not have to be medical grade).        You are scheduled for a Covid-19 test  on 8/5 at 620pm.  Please see the map below for instructions        Pre-Admissions Testing RN  [facility]  [email or web address]  [address]   [place], [place] [address]  [phone] (Main)  [phone](UJW)  [email or web address]  An Affiliate of [facility]

## 2018-12-12 ENCOUNTER — Encounter: Admit: 2018-12-12 | Discharge: 2018-12-12

## 2018-12-13 DIAGNOSIS — Z01818 Encounter for other preprocedural examination: Secondary | ICD-10-CM

## 2018-12-13 NOTE — Progress Notes
Patient arrived to [place] clinic for COVID-19 testing 12/13/18 1747. Patient identity confirmed via photo I.D. Nasopharyngeal procedure explained to the patient.   Nasopharyngeal swab completed left  Patient education provided given and instructed patient self isolate until contacted w/ results and further instructions. CDC handout on COVID-19 given to patient.   [email or web address]    Swab collected by Tiffany Rito.    Date symptoms began/reason for testing: pre op 12/15/18

## 2018-12-14 ENCOUNTER — Encounter: Admit: 2018-12-14 | Discharge: 2018-12-14

## 2018-12-14 ENCOUNTER — Encounter: Admit: 2018-12-13 | Discharge: 2018-12-14

## 2018-12-14 DIAGNOSIS — Z1159 Encounter for screening for other viral diseases: Secondary | ICD-10-CM

## 2018-12-14 MED ORDER — LATUDA 20 MG PO TAB
10-20 mg | ORAL_TABLET | Freq: Every day | ORAL | 0 refills | Status: DC
Start: 2018-12-14 — End: 2019-03-19
  Filled 2018-12-17: qty 90, 90d supply, fill #1

## 2018-12-14 MED ORDER — CLONAZEPAM 1 MG PO TAB
.5 mg | ORAL_TABLET | Freq: Four times a day (QID) | ORAL | 0 refills | Status: DC | PRN
Start: 2018-12-14 — End: 2019-03-19
  Filled 2018-12-17: qty 180, 90d supply, fill #1

## 2018-12-14 MED ORDER — FLUOXETINE 20 MG PO CAP
60 mg | ORAL_CAPSULE | Freq: Every day | ORAL | 0 refills | Status: DC
Start: 2018-12-14 — End: 2019-03-19
  Filled 2018-12-17: qty 270, 90d supply, fill #1

## 2018-12-14 MED ORDER — ARMODAFINIL 250 MG PO TAB
250 mg | ORAL_TABLET | Freq: Every morning | ORAL | 0 refills | Status: DC
Start: 2018-12-14 — End: 2019-03-19
  Filled 2018-12-17: qty 90, 90d supply, fill #1

## 2018-12-15 ENCOUNTER — Encounter: Admit: 2018-12-15 | Discharge: 2018-12-15

## 2018-12-15 DIAGNOSIS — G4730 Sleep apnea, unspecified: Secondary | ICD-10-CM

## 2018-12-15 DIAGNOSIS — R000 Tachycardia, unspecified: Secondary | ICD-10-CM

## 2018-12-15 DIAGNOSIS — F4310 Post-traumatic stress disorder, unspecified: Secondary | ICD-10-CM

## 2018-12-15 DIAGNOSIS — I499 Cardiac arrhythmia, unspecified: Secondary | ICD-10-CM

## 2018-12-15 DIAGNOSIS — Q615 Medullary cystic kidney: Secondary | ICD-10-CM

## 2018-12-15 DIAGNOSIS — Z8659 Personal history of other mental and behavioral disorders: Secondary | ICD-10-CM

## 2018-12-15 DIAGNOSIS — E039 Hypothyroidism, unspecified: Secondary | ICD-10-CM

## 2018-12-15 DIAGNOSIS — F329 Major depressive disorder, single episode, unspecified: Secondary | ICD-10-CM

## 2018-12-15 DIAGNOSIS — N390 Urinary tract infection, site not specified: Secondary | ICD-10-CM

## 2018-12-15 DIAGNOSIS — E785 Hyperlipidemia, unspecified: Secondary | ICD-10-CM

## 2018-12-15 DIAGNOSIS — F99 Mental disorder, not otherwise specified: Secondary | ICD-10-CM

## 2018-12-15 LAB — COVID-19 (SARS-COV-2) PCR

## 2018-12-15 MED ORDER — PROPOFOL 10 MG/ML IV EMUL
INTRAVENOUS | 0 refills | Status: DC
Start: 2018-12-15 — End: 2018-12-15

## 2018-12-15 MED ORDER — SIMETHICONE 40 MG/0.6 ML PO DRPS
0 refills | Status: DC
Start: 2018-12-15 — End: 2018-12-20

## 2018-12-15 MED ORDER — LIDOCAINE (PF) 200 MG/10 ML (2 %) IJ SYRG
0 refills | Status: DC
Start: 2018-12-15 — End: 2018-12-15

## 2018-12-15 MED ORDER — ONDANSETRON 4 MG PO TBDI
4 mg | Freq: Once | ORAL | 0 refills | Status: AC | PRN
Start: 2018-12-15 — End: ?

## 2018-12-15 MED ORDER — LIDOCAINE (PF) 10 MG/ML (1 %) IJ SOLN
.1-2 mL | INTRAMUSCULAR | 0 refills | Status: DC | PRN
Start: 2018-12-15 — End: 2018-12-20

## 2018-12-15 MED ORDER — PROMETHAZINE 25 MG/ML IJ SOLN
6.25 mg | INTRAVENOUS | 0 refills | Status: DC | PRN
Start: 2018-12-15 — End: 2018-12-20

## 2018-12-15 MED ORDER — LACTATED RINGERS IV SOLP
1000 mL | INTRAVENOUS | 0 refills | Status: DC
Start: 2018-12-15 — End: 2018-12-20

## 2018-12-15 MED ORDER — PROPOFOL INJ 10 MG/ML IV VIAL
INTRAVENOUS | 0 refills | Status: DC
Start: 2018-12-15 — End: 2018-12-15

## 2018-12-15 MED ORDER — ONDANSETRON HCL (PF) 4 MG/2 ML IJ SOLN
4 mg | Freq: Once | INTRAVENOUS | 0 refills | Status: AC | PRN
Start: 2018-12-15 — End: ?

## 2018-12-15 NOTE — Anesthesia Pre-Procedure Evaluation
Anesthesia Pre-Procedure Evaluation    Name: Holly Maldonado      MRN: [id number]     DOB: [birth year]     Age: 58 y.o.     Sex: female   _________________________________________________________________________     Procedure Info:   Procedure Information     Date/Time:  12/15/18 1315    Procedure:  COLONOSCOPY DIAGNOSTIC WITH SPECIMEN COLLECTION BY BRUSHING/ WASHING - FLEXIBLE (N/A )    Location:  [id number]    Surgeon:  Veneta Penton, MD          Physical Assessment  Vital Signs (last filed in past 24 hours):  Temp: 36.8 ???C (98.2 ???F) (08/07 1222)      Patient History   Allergies   Allergen Reactions   ??? Fetzima [Levomilnacipran] SEE COMMENTS     Per pt very depressed, couldn't stop crying.        Current Medications    Medication Directions   armodafiniL (NUVIGIL) 250 mg tablet Take 1 tablet by mouth every morning   azelastine (ASTELIN) 137 mcg (0.1 %) nasal spray Apply one spray to two sprays to each nostril as directed twice daily as needed.   cetirizine (ZYRTEC) 5 mg tablet Take one tablet by mouth every morning.   Cholecalciferol (Vitamin D3) 1,000 unit cap Take 1,000 Units by mouth.   clonazePAM (KLONOPIN) 0.5 mg tablet Take 1 tablet by mouth up to 4 daily as needed for anxiety.   clonazePAM (KLONOPIN) 0.5 mg tablet Take 0.5 mg by mouth twice daily.   clonazePAM (KLONOPIN) 1 mg tablet Take one-half tablet by mouth four times daily as needed.   docosahexaenoic acid/epa (FISH OIL PO) Take  by mouth.   FLUoxetine (PROZAC) 20 mg capsule Take three capsules by mouth daily.   fluoxetine(+) (PROZAC) 40 mg capsule Take 60 mg by mouth daily.   levothyroxine (SYNTHROID) 137 mcg tablet Take one tablet by mouth daily.   levothyroxine (TIROSINT) 137 mcg capsule Take one capsule by mouth daily.   lurasidone (LATUDA) 20 mg tablet Take one-half tablet to one tablet by mouth daily with food.   mometasone (NASONEX) 50 mcg/actuation nasal spray Apply one spray to each nostril as directed twice daily. montelukast (SINGULAIR) 10 mg tablet Take one tablet by mouth at bedtime daily.   Multivitamins with Fluoride (MULTI-VITAMIN PO) Take  by mouth.   olopatadine (PATANOL) 0.1 % ophthalmic solution Place one drop into eye(s) twice daily as needed for Allergy symptoms.   peg-electrolyte solution (NULYTELY) 420 gram oral solution Take as directed.  Do not mix more than 24 hrs before procedure.  Refrigerate after mixing.   progesterone, micronized (PROMETRIUM) 100 mg capsule Take one capsule by mouth at bedtime daily.         Review of Systems/Medical History        PONV Screening: Female gender and Non-smoker  No history of anesthetic complications  No family history of anesthetic complications      Airway - negative        Pulmonary       Not a current smoker        No recent URI        Sleep apnea; noncompliant      Cardiovascular         No hypertension,         Dysrhythmias: PVCs.      GI/Hepatic/Renal         No GERD,  No hx of liver disease     No renal disease      Neuro/Psych       No seizures      No CVA        Psychiatric history          Depression          Anxiety        Endocrine/Other         Hypothyroidism      Pituitary adenoma - stable    Constitution - negative   Physical Exam    Airway Findings      Mallampati: II      TM distance: >3 FB      Neck ROM: full      Mouth opening: good    Dental Findings: Negative      Cardiovascular Findings:       Rhythm: regular      Rate: normal      No murmur    Pulmonary Findings:       Breath sounds clear to auscultation.    Neurological Findings:       Alert and oriented x 3    Constitutional findings: Negative       Diagnostic Tests  Hematology:   Lab Results   Component Value Date    HGB 12.0 02/08/2017    HCT 35.2 02/08/2017    PLTCT 230 02/08/2017    WBC 4.2 02/08/2017    NEUT 54 02/08/2017    ANC 2.30 02/08/2017    ALC 1.30 02/08/2017    MONA 10 02/08/2017    AMC 0.40 02/08/2017    EOSA 3 02/08/2017    ABC 0.10 02/08/2017    MCV 91.7 02/08/2017 MCH 31.2 02/08/2017    MCHC 34.0 02/08/2017    MPV 7.6 02/08/2017    RDW 12.9 02/08/2017         General Chemistry:   Lab Results   Component Value Date    NA 140 10/04/2018    K 4.4 10/04/2018    CL 104 10/04/2018    CO2 27 10/04/2018    GAP 8 02/08/2017    BUN 10 10/04/2018    CR 0.85 10/04/2018    GLU 90 10/04/2018    GLU 90 03/17/2010    CA 10.2 10/04/2018    ALBUMIN 4.7 10/04/2018    OBSCA 5.5 03/17/2010    MG 2.1 02/08/2017    TOTBILI 0.7 10/04/2018    PO4 4.0 03/17/2010      Coagulation:   Lab Results   Component Value Date    PTT 30.4 02/08/2017    INR 1.0 02/08/2017         Anesthesia Plan    ASA score: 2   Plan: MAC  Induction method: intravenous  NPO status: acceptable      Informed Consent  Anesthetic plan and risks discussed with patient.

## 2018-12-15 NOTE — Discharge Instructions - Supplementary Instructions
..  [address] [place], [place] [address]    Phone [phone]  [facility]  Fax [phone]    PROCEDURE:  ? Colonoscopy ? EGD ? Upper EUS ? Lower EUS ? Esophageal Dilatation                ? Biopsies taken  ? Polyps removed   ? Flex Sigmoidoscopy   ? Celiac Plexus Block                   After your procedure you may expect some of the following common symptoms, or perhaps none:  ?  You may have a sore throat for 2-3 days after the procedure.  Sucrets or Cepastat lozenges will help the discomfort.  ?  If you have any abdominal cramping after the procedure this can be relieved by belching or passing air.  ?  A small amount of bleeding is normal if a biopsy was taken or polyps removed.  ACTIVITY:   ?  Rest at home for the next 12 hours.  You may then begin to resume your normal activities.   ?  DO NOT drive any vehicle, operate any power tools, make any major decisions, or sign any legal documents for the next 12 hours.   ? You need someone to stay with you for 12 hours after the procedure.     MEDICATIONS:   ?  Resume your normal medications and their schedule.   ?  Start new prescription:  __________________________________   ?  Stop the following medications:  ___________________________   ? Avoid aspirin and aspirin containing products such as Anacin, Bufferin or Alka Seltzer for ________________ or as        Directed by your physician if a polypectomy has been performed.  (Tylenol is OK to use.)  DIET:   ?  If no nausea or vomiting occurs, you may resume your normal diet.   ? DO NOT drink alcohol, including beer or wine for the next 12 hours.   ? High fiber diet.    IV SITE:  If IV site is painful, place a warm, wet compress on the area and repeat until the soreness is better.  If not improved in 3 days, notify your physician's office.    CONTACT YOUR DOCTOR IF YOU HAVE:  - Nausea or vomiting that lasts greater than 12 hours.  - A temperature greater than 101???F unrelieved by Tylenol. - Severe or unusual abdominal discomfort or pain.  - Any bleeding from your mouth or bowels greater than two tablespoons and increasing.  - Increased shortness of breath.  - Coughing up blood, black tarry looking stools or passage of blood.  - Difficulty swallowing.  IF YOUR DOCTOR TOOK BIOPSIES OR TISSUE SAMPLES:  - Please allow 10 days for the results to be completed. You will receive a letter in the mail with results.  If you have any concerns after the procedure call:     *   GI Clinic [phone] (follow prompts to the doctors nurse)    - If after 5pm or weekends contact GI Fellow on call through the [place] operator [phone]    Next appointment/Follow up:  __See Report________________________________________________        I acknowledge receipt of the instructions above and understand them as explained.    We hope that your visit was a pleasant one.  Please complete the emailed patient satisfaction survey at your convenience. We appreciate your comments regarding your care.

## 2018-12-15 NOTE — H&P (View-Only)
Pre Procedure History and Physical/Sedation Plan    Name:Smrithi HELLEN SHANLEY                                                                   MRN: [id number]                 DOB:Oct 16, 1960          Age: 58 y.o.  Date of Service: 12/15/2018    Date of Procedure:  12/15/2018    Planned Procedure(s):  GI:  Colonoscopy  Sedation/Medication Plan: MAC (Monitored Anesthesia Care)  Discussion/Reviews:  Physician has discussed risks and alternatives of this type of sedation and above planned procedures with patient  ___________________________________________________________________  Chief Complaint: Average risk screening colonoscopy    History of Present Illness: Holly Maldonado is a 58 y.o. female with history as below    Previous Anesthetic/Sedation History:  reviewed    Medical History:   Diagnosis Date   ??? Arrhythmia     multiple PVCs from EKG 08/14/12   ??? depression    ??? Dyslipidemia    ??? Endometriosis    ??? H/O eating disorder    ??? Hematuria     microscopic   ??? Hypothyroid    ??? Medullary sponge kidney    ??? Mental disorder    ??? Nephrocalcinosis    ??? PTSD (post-traumatic stress disorder)    ??? Recurrent UTI    ??? Sleep apnea    ??? Tachycardia      Surgical History:   Procedure Laterality Date   ??? HX TUBAL LIGATION  2010   ??? ELECTROCARDIOGRAM     ??? HX BLADDER SUSPENSION     ??? HX CESAREAN SECTION     ??? HX HYSTERECTOMY     ??? HX SINUS SURGERY     ??? HX TONSILLECTOMY       Pertinent medical/surgical history reviewed  Pertinent family history reviewed  Social History     Tobacco Use   ??? Smoking status: Former Smoker     Packs/day: 1.00     Types: Cigarettes     Last attempt to quit: 08/22/1992     Years since quitting: 26.3   ??? Smokeless tobacco: Never Used   Substance Use Topics   ??? Alcohol use: No   ??? Drug use: No     Social History     Substance and Sexual Activity   Drug Use No     Allergies:  Fetzima [levomilnacipran]  Medications  Current Outpatient Medications   Medication Sig ??? armodafiniL (NUVIGIL) 250 mg tablet Take 1 tablet by mouth every morning   ??? azelastine (ASTELIN) 137 mcg (0.1 %) nasal spray Apply one spray to two sprays to each nostril as directed twice daily as needed.   ??? cetirizine (ZYRTEC) 5 mg tablet Take one tablet by mouth every morning.   ??? Cholecalciferol (Vitamin D3) 1,000 unit cap Take 1,000 Units by mouth.   ??? clonazePAM (KLONOPIN) 0.5 mg tablet Take 1 tablet by mouth up to 4 daily as needed for anxiety.   ??? clonazePAM (KLONOPIN) 0.5 mg tablet Take 0.5 mg by mouth twice daily.   ??? clonazePAM (KLONOPIN) 1 mg tablet Take one-half tablet by mouth four times daily as needed.   ???  docosahexaenoic acid/epa (FISH OIL PO) Take  by mouth.   ??? FLUoxetine (PROZAC) 20 mg capsule Take three capsules by mouth daily.   ??? fluoxetine(+) (PROZAC) 40 mg capsule Take 60 mg by mouth daily.   ??? levothyroxine (SYNTHROID) 137 mcg tablet Take one tablet by mouth daily.   ??? levothyroxine (TIROSINT) 137 mcg capsule Take one capsule by mouth daily.   ??? lurasidone (LATUDA) 20 mg tablet Take one-half tablet to one tablet by mouth daily with food.   ??? mometasone (NASONEX) 50 mcg/actuation nasal spray Apply one spray to each nostril as directed twice daily.   ??? montelukast (SINGULAIR) 10 mg tablet Take one tablet by mouth at bedtime daily.   ??? Multivitamins with Fluoride (MULTI-VITAMIN PO) Take  by mouth.   ??? olopatadine (PATANOL) 0.1 % ophthalmic solution Place one drop into eye(s) twice daily as needed for Allergy symptoms.   ??? peg-electrolyte solution (NULYTELY) 420 gram oral solution Take as directed.  Do not mix more than 24 hrs before procedure.  Refrigerate after mixing.   ??? progesterone, micronized (PROMETRIUM) 100 mg capsule Take one capsule by mouth at bedtime daily.     Current Facility-Administered Medications   Medication   ??? lactated ringers infusion   ??? lidocaine PF 1% (10 mg/mL) injection 0.1-2 mL   ??? simethicone (MYLICON) drops     Review of Systems: All other systems reviewed and are negative.           Physical Exam:  Temp: 36.8 ???C (98.2 ???F) (08/07 1222)  Physical Exam   General appearance: alert  Throat: Lips, mucosa, and tongue normal. Teeth and gums normal  Lungs: clear to auscultation bilaterally  Heart: regular rate and rhythm, S1, S2 normal, no murmur, click, rub or gallop  Abdomen: soft, non-tender. Bowel sounds normal. No masses,  no organomegaly  Extremities: extremities normal, atraumatic, no cyanosis or edema  Airway:  airway assessment performed  Mallampati II (soft palate, uvula, fauces visible)  Anesthesia Classification:  ASA II (A normal patient with mild systemic disease)  NPO Status: Acceptable  Pregnancy Status: Not Pregnant    Lab/Radiology/Other Diagnostic Tests  Labs:  Relevant labs reviewed      Normajean Baxter, MD  Gastroenterology Staff

## 2018-12-15 NOTE — Anesthesia Post-Procedure Evaluation
Post-Anesthesia Evaluation    Name: Holly Maldonado      MRN: [id number]     DOB: 06/27/1960     Age: 58 y.o.     Sex: female   __________________________________________________________________________     Procedure Information     Anesthesia Start Date/Time:  12/15/18 1335    Procedure:  COLONOSCOPY DIAGNOSTIC WITH SPECIMEN COLLECTION BY BRUSHING/ WASHING - FLEXIBLE (N/A )    Location:  [facility] [facility]    Surgeon:  Lanney Gins, MD          Post-Anesthesia Vitals  BP: 124/72 (08/07 1425)  Temp: 36.2 C (97.2 F) (08/07 1415)  Respirations: 16 PER MINUTE (08/07 1430)  SpO2: 100 % (08/07 1430)  SpO2 Pulse: 61 (08/07 1430)   Vitals Value Taken Time   BP 124/72 12/15/2018  2:25 PM   Temp 36.2 C (97.2 F) 12/15/2018  2:15 PM   Pulse     Respirations 16 PER MINUTE 12/15/2018  2:30 PM   SpO2 100 % 12/15/2018  2:30 PM         Post Anesthesia Evaluation Note    Evaluation location: Pre/Post  Patient participation: recovered; patient participated in evaluation  Level of consciousness: alert    Pain score: 0  Pain management: adequate    Hydration: normovolemia  Temperature: 36.0C - 38.4C  Airway patency: adequate    Perioperative Events       Post-op nausea and vomiting: no PONV    Postoperative Status  Cardiovascular status: hemodynamically stable  Respiratory status: spontaneous ventilation        Perioperative Events  Perioperative Event: No  Emergency Case Activation: No

## 2018-12-17 ENCOUNTER — Encounter: Admit: 2018-12-17 | Discharge: 2018-12-17

## 2018-12-18 ENCOUNTER — Encounter: Admit: 2018-12-18 | Discharge: 2018-12-18

## 2018-12-18 DIAGNOSIS — N390 Urinary tract infection, site not specified: Secondary | ICD-10-CM

## 2018-12-18 DIAGNOSIS — I499 Cardiac arrhythmia, unspecified: Secondary | ICD-10-CM

## 2018-12-18 DIAGNOSIS — Q615 Medullary cystic kidney: Secondary | ICD-10-CM

## 2018-12-18 DIAGNOSIS — E785 Hyperlipidemia, unspecified: Secondary | ICD-10-CM

## 2018-12-18 DIAGNOSIS — G4730 Sleep apnea, unspecified: Secondary | ICD-10-CM

## 2018-12-18 DIAGNOSIS — F329 Major depressive disorder, single episode, unspecified: Secondary | ICD-10-CM

## 2018-12-18 DIAGNOSIS — F99 Mental disorder, not otherwise specified: Secondary | ICD-10-CM

## 2018-12-18 DIAGNOSIS — F4310 Post-traumatic stress disorder, unspecified: Secondary | ICD-10-CM

## 2018-12-18 DIAGNOSIS — E039 Hypothyroidism, unspecified: Secondary | ICD-10-CM

## 2018-12-18 DIAGNOSIS — Z8659 Personal history of other mental and behavioral disorders: Secondary | ICD-10-CM

## 2018-12-18 DIAGNOSIS — R000 Tachycardia, unspecified: Secondary | ICD-10-CM

## 2018-12-20 ENCOUNTER — Encounter: Admit: 2018-12-20 | Discharge: 2018-12-20

## 2018-12-20 NOTE — Telephone Encounter
Discussed with patient's MRI findings.  Patient states that her blood pressure is well controlled. Reports that she gets intermittent headache. Also advised that findings of chronic microvascular ischemia could be due to uncontrolled hypertension or diabetes.  Recommended that if she is having headache or concerns about MRI finding to follow-up with PCP to determine whether patient needs to be seen by neurology or not

## 2018-12-20 NOTE — Telephone Encounter
-----   Message from Nelle Don, RN sent at 12/20/2018  8:58 AM CDT -----  Regarding: FW: Test Results Question  Contact: [phone]    ----- Message -----  From: Carroll Kinds  Sent: 12/20/2018   7:06 AM CDT  To: Ahmed Prima Nurse [facility]  Subject: Test Results Question                            I'm really concerned about this in the doctors test results letter, 2.  Mild nonspecific supratentorial white matter FLAIR hyperintensities, likely due to chronic microvessel ischemia and/or clinically reported headaches.    What does that mean, is it going to get worse how do I stop it. What problems can it cause.    Thanks   Holly Maldonado  [phone]

## 2019-01-03 ENCOUNTER — Encounter: Admit: 2019-01-03 | Discharge: 2019-01-03

## 2019-01-03 MED ORDER — TRIAMCINOLONE ACETONIDE 0.1 % TP CREA
Freq: Two times a day (BID) | TOPICAL | 2 refills | Status: DC
Start: 2019-01-03 — End: 2019-03-19
  Filled 2019-01-03: qty 30, 30d supply, fill #1

## 2019-01-03 MED ORDER — LEVOTHYROXINE 125 MCG PO TAB
125 ug | ORAL_TABLET | Freq: Every day | ORAL | 2 refills | 30.00000 days | Status: AC
Start: 2019-01-03 — End: ?
  Filled 2019-01-03: qty 60, 60d supply, fill #1

## 2019-01-03 MED FILL — OLOPATADINE 0.1 % OP DROP: 0.1 % | OPHTHALMIC | 25 days supply | Qty: 5 | Status: CN

## 2019-01-03 MED FILL — MOMETASONE 50 MCG/ACTUATION NA SPRY: 50 mcg/actuation | NASAL | 30 days supply | Qty: 17 | Fill #6 | Status: AC

## 2019-01-03 MED FILL — MONTELUKAST 10 MG PO TAB: 10 mg | ORAL | 30 days supply | Qty: 30 | Fill #7 | Status: AC

## 2019-01-03 MED FILL — AZELASTINE 137 MCG (0.1 %) NA SPRA: 137 mcg (0.1 %) | NASAL | 25 days supply | Qty: 30 | Status: CN

## 2019-01-03 MED FILL — MONTELUKAST 10 MG PO TAB: 10 mg | ORAL | 30 days supply | Qty: 30 | Status: CN

## 2019-01-03 MED FILL — AZELASTINE 137 MCG (0.1 %) NA SPRA: 137 mcg (0.1 %) | NASAL | 25 days supply | Qty: 30 | Fill #2 | Status: AC

## 2019-01-03 MED FILL — OLOPATADINE 0.1 % OP DROP: 0.1 % | OPHTHALMIC | 25 days supply | Qty: 5 | Fill #2 | Status: AC

## 2019-01-03 MED FILL — MOMETASONE 50 MCG/ACTUATION NA SPRY: 50 mcg/actuation | NASAL | 30 days supply | Qty: 17 | Status: CN

## 2019-01-04 ENCOUNTER — Encounter: Admit: 2019-01-04 | Discharge: 2019-01-04

## 2019-01-05 ENCOUNTER — Encounter: Admit: 2019-01-05 | Discharge: 2019-01-05

## 2019-01-05 MED ORDER — ROSUVASTATIN 10 MG PO TAB
10 mg | ORAL_TABLET | Freq: Every day | ORAL | 1 refills | 90.00000 days | Status: DC
Start: 2019-01-05 — End: 2019-08-14
  Filled 2019-01-05: qty 90, 90d supply, fill #1

## 2019-01-09 ENCOUNTER — Encounter: Admit: 2019-01-09 | Discharge: 2019-01-09

## 2019-01-09 DIAGNOSIS — Z1329 Encounter for screening for other suspected endocrine disorder: Secondary | ICD-10-CM

## 2019-01-09 DIAGNOSIS — E7800 Pure hypercholesterolemia, unspecified: Secondary | ICD-10-CM

## 2019-01-09 DIAGNOSIS — E559 Vitamin D deficiency, unspecified: Secondary | ICD-10-CM

## 2019-03-05 ENCOUNTER — Encounter: Admit: 2019-03-05 | Discharge: 2019-03-05

## 2019-03-19 ENCOUNTER — Encounter: Admit: 2019-03-19 | Discharge: 2019-03-19

## 2019-03-19 ENCOUNTER — Ambulatory Visit: Admit: 2019-03-19 | Discharge: 2019-03-20 | Payer: Commercial Managed Care - HMO

## 2019-03-19 DIAGNOSIS — N390 Urinary tract infection, site not specified: Secondary | ICD-10-CM

## 2019-03-19 DIAGNOSIS — Z8659 Personal history of other mental and behavioral disorders: Secondary | ICD-10-CM

## 2019-03-19 DIAGNOSIS — R000 Tachycardia, unspecified: Secondary | ICD-10-CM

## 2019-03-19 DIAGNOSIS — I493 Ventricular premature depolarization: Secondary | ICD-10-CM

## 2019-03-19 DIAGNOSIS — F4310 Post-traumatic stress disorder, unspecified: Secondary | ICD-10-CM

## 2019-03-19 DIAGNOSIS — D352 Benign neoplasm of pituitary gland: Secondary | ICD-10-CM

## 2019-03-19 DIAGNOSIS — G4730 Sleep apnea, unspecified: Secondary | ICD-10-CM

## 2019-03-19 DIAGNOSIS — E785 Hyperlipidemia, unspecified: Secondary | ICD-10-CM

## 2019-03-19 DIAGNOSIS — E039 Hypothyroidism, unspecified: Secondary | ICD-10-CM

## 2019-03-19 DIAGNOSIS — E7800 Pure hypercholesterolemia, unspecified: Secondary | ICD-10-CM

## 2019-03-19 DIAGNOSIS — F329 Major depressive disorder, single episode, unspecified: Secondary | ICD-10-CM

## 2019-03-19 DIAGNOSIS — E7841 Elevated Lipoprotein(a): Secondary | ICD-10-CM

## 2019-03-19 DIAGNOSIS — I499 Cardiac arrhythmia, unspecified: Secondary | ICD-10-CM

## 2019-03-19 DIAGNOSIS — G4733 Obstructive sleep apnea (adult) (pediatric): Secondary | ICD-10-CM

## 2019-03-19 DIAGNOSIS — F99 Mental disorder, not otherwise specified: Secondary | ICD-10-CM

## 2019-03-19 DIAGNOSIS — Q615 Medullary cystic kidney: Secondary | ICD-10-CM

## 2019-03-19 MED ORDER — EZETIMIBE 10 MG PO TAB
10 mg | ORAL_TABLET | Freq: Every day | ORAL | 3 refills | Status: DC
Start: 2019-03-19 — End: 2019-06-04
  Filled 2019-03-19: qty 90, 90d supply

## 2019-03-19 NOTE — Patient Instructions
Familial/Pure Hypercholesterolemia with an elevated Lp(a): Risk factors: age, wt, FH. Goal: LDL70mg /dL, NON-HDL100mg /dL and Lp(a)<75nmol/L.  Carotid IMT analysis.  Dietary consult.  ASA 81mg /qod for elevated Lp(a).  Have family members checked for Lp(a).  Initiate zetia 10mg /qd.  Change O-3-FA to EPA only.  Will consider further therapy on RTC in 4 months.

## 2019-03-20 ENCOUNTER — Encounter: Admit: 2019-03-20 | Discharge: 2019-03-20

## 2019-03-20 MED ORDER — MONTELUKAST 10 MG PO TAB
10 mg | ORAL_TABLET | Freq: Every evening | ORAL | 7 refills | 90.00000 days | Status: DC
Start: 2019-03-20 — End: 2019-03-29

## 2019-03-20 NOTE — Telephone Encounter
Patient requesting a refill of Singulair. LOV was 11/17/2018. Follow up scheduled on 05/14/19. Per LOV note: "Continue singulair 10 mg once a day."    Ok to refill per standing Allergy/Immunology refill protocol.

## 2019-03-23 ENCOUNTER — Encounter: Admit: 2019-03-23 | Discharge: 2019-03-23

## 2019-03-27 ENCOUNTER — Encounter: Admit: 2019-03-27 | Discharge: 2019-03-27

## 2019-03-28 ENCOUNTER — Ambulatory Visit: Admit: 2019-03-28 | Discharge: 2019-03-28 | Payer: Commercial Managed Care - HMO

## 2019-03-28 ENCOUNTER — Encounter: Admit: 2019-03-28 | Discharge: 2019-03-28

## 2019-03-28 ENCOUNTER — Ambulatory Visit: Admit: 2019-03-28 | Discharge: 2019-03-29 | Payer: Commercial Managed Care - HMO

## 2019-03-28 DIAGNOSIS — R000 Tachycardia, unspecified: Secondary | ICD-10-CM

## 2019-03-28 DIAGNOSIS — E7800 Pure hypercholesterolemia, unspecified: Secondary | ICD-10-CM

## 2019-03-28 DIAGNOSIS — E785 Hyperlipidemia, unspecified: Secondary | ICD-10-CM

## 2019-03-28 DIAGNOSIS — F4310 Post-traumatic stress disorder, unspecified: Secondary | ICD-10-CM

## 2019-03-28 DIAGNOSIS — Z8659 Personal history of other mental and behavioral disorders: Secondary | ICD-10-CM

## 2019-03-28 DIAGNOSIS — F329 Major depressive disorder, single episode, unspecified: Secondary | ICD-10-CM

## 2019-03-28 DIAGNOSIS — Q615 Medullary cystic kidney: Secondary | ICD-10-CM

## 2019-03-28 DIAGNOSIS — I499 Cardiac arrhythmia, unspecified: Secondary | ICD-10-CM

## 2019-03-28 DIAGNOSIS — R635 Abnormal weight gain: Secondary | ICD-10-CM

## 2019-03-28 DIAGNOSIS — E039 Hypothyroidism, unspecified: Secondary | ICD-10-CM

## 2019-03-28 DIAGNOSIS — G4730 Sleep apnea, unspecified: Secondary | ICD-10-CM

## 2019-03-28 DIAGNOSIS — N951 Menopausal and female climacteric states: Secondary | ICD-10-CM

## 2019-03-28 DIAGNOSIS — F99 Mental disorder, not otherwise specified: Secondary | ICD-10-CM

## 2019-03-28 DIAGNOSIS — N390 Urinary tract infection, site not specified: Secondary | ICD-10-CM

## 2019-03-28 MED ORDER — GABAPENTIN 300 MG PO CAP
300 mg | ORAL_CAPSULE | ORAL | 11 refills | Status: DC
Start: 2019-03-28 — End: 2019-08-14

## 2019-03-28 NOTE — Progress Notes
Date of Service: 03/28/2019    Subjective:             Holly Maldonado is a 58 y.o. female.    History of Present Illness  Here for menopause management.  She did see Dr. Samuella Cota in the spring 2028 was started on Vivelle dot 0.05 for vasomotor symptoms and worsening depression.  The Vivelle-Dot did not help.  She has gained weight about 40 pounds.  She admits she has had a eating disorder since her teen years.  As she her thyroid is underactive her primary care provider is managing her Synthroid medication.  Elevated cholesterol saw Dr.Moriarty who checked a light bow protein a level which was markedly elevated.  She is scheduled for a carotid IM T study after the first of the year.  She is meeting with a dietitian within the next month.  He changed her cholesterol medication.  She states history of major depression her depressed mood has become worse she had a hysterectomy several years ago for benign disease.  Hot flashes wax and wane.  After trying Vivelle-Dot, she went to a hormone doctor who gave her injections which made her mood and symptoms all over the place she stopped going to him.  Her main concerns are vasomotor symptoms, weight gain, and worsened depressed mood.  Difficulty sleeping present       Review of Systems   Constitutional: Positive for fatigue and unexpected weight change. Negative for fever.   HENT: Negative.    Eyes: Negative.    Respiratory: Positive for cough and shortness of breath.    Cardiovascular: Negative.  Negative for chest pain and leg swelling.   Gastrointestinal: Positive for constipation and nausea. Negative for abdominal pain, blood in stool, diarrhea and vomiting.   Endocrine: Negative.    Genitourinary: Positive for urgency. Negative for difficulty urinating, dyspareunia, dysuria, enuresis, frequency, genital sores, hematuria, menstrual problem, pelvic pain, vaginal bleeding, vaginal discharge and vaginal pain. Musculoskeletal: Positive for arthralgias. Negative for back pain.   Skin: Negative.    Allergic/Immunologic: Negative.    Neurological: Positive for light-headedness and headaches.   Hematological: Negative.  Negative for adenopathy. Does not bruise/bleed easily.   Psychiatric/Behavioral: Positive for confusion. The patient is nervous/anxious.    All other systems reviewed and are negative.      Medical History:   Diagnosis Date   ? Arrhythmia     multiple PVCs from EKG 08/14/12   ? depression    ? Dyslipidemia    ? Endometriosis    ? H/O eating disorder    ? Hematuria     microscopic   ? High cholesterol    ? Hypothyroid    ? Medullary sponge kidney    ? Mental disorder    ? Nephrocalcinosis    ? PTSD (post-traumatic stress disorder)    ? Recurrent UTI    ? Sleep apnea    ? Tachycardia        Surgical History:   Procedure Laterality Date   ? HX TUBAL LIGATION  2010   ? COLONOSCOPY DIAGNOSTIC WITH SPECIMEN COLLECTION BY BRUSHING/ WASHING - FLEXIBLE N/A 12/15/2018    Performed by Veneta Penton, MD at [facility]   ? ELECTROCARDIOGRAM     ? HX BLADDER SUSPENSION     ? HX CESAREAN SECTION     ? HX HYSTERECTOMY     ? HX SINUS SURGERY     ? HX TONSILLECTOMY     ? LAPAROSCOPY  Social History     Tobacco Use   ? Smoking status: Former Smoker     Packs/day: 1.00     Types: Cigarettes     Quit date: 08/22/1992     Years since quitting: 26.6   ? Smokeless tobacco: Never Used   Substance Use Topics   ? Alcohol use: No   ? Drug use: No       Family History   Problem Relation Age of Onset   ? Coronary Artery Disease Father    ? Diabetes Type II Mother    ? Cancer Mother         leukemia   ? Diabetes Mother    ? Heart Attack Mother    ? Coronary Artery Disease Maternal Grandmother    ? Circulatory problem Maternal Grandmother    ? Coronary Artery Disease Maternal Grandfather    ? Heart Attack Maternal Grandfather    ? Heart Surgery Maternal Grandfather    ? Coronary Artery Disease Paternal Grandmother ? Coronary Artery Disease Paternal Grandfather          Objective:         ? ascorbic acid (VITAMIN C) 500 mg tablet Take 500 mg by mouth daily.   ? azelastine (ASTELIN) 137 mcg (0.1 %) nasal spray Apply one spray to two sprays to each nostril as directed twice daily as needed.   ? cetirizine (ZYRTEC) 5 mg tablet Take one tablet by mouth every morning.   ? Cholecalciferol (Vitamin D3) 1,000 unit cap Take 5,000 Units by mouth.   ? clonazePAM (KLONOPIN) 0.5 mg tablet Take 0.5 mg by mouth twice daily.   ? coQ10 (ubiquinol) 100 mg cap Take 100 mg by mouth daily.   ? desvenlafaxine (PRISTIQ) 50 mg tablet Take 100 mg by mouth daily.   ? docosahexaenoic acid/epa (FISH OIL PO) Take 3 capsules by mouth daily.   ? ezetimibe (ZETIA) 10 mg tablet Take one tablet by mouth daily.   ? lactobacillus rhamnosus (GG) (CULTURELLE) 10 billion cell cap Take 1 capsule by mouth daily with breakfast.   ? levothyroxine (SYNTHROID) 125 mcg tablet Take one tablet by mouth daily and recheck a TSH in 2 months   ? magnesium oxide (MAG-OX) 400 mg (241.3 mg magnesium) tablet Take 400 mg by mouth daily.   ? mometasone (NASONEX) 50 mcg/actuation nasal spray Apply one spray to each nostril as directed twice daily.   ? montelukast (SINGULAIR) 10 mg tablet Take one tablet by mouth at bedtime daily.   ? Multivitamins with Fluoride (MULTI-VITAMIN PO) Take  by mouth.   ? olopatadine (PATANOL) 0.1 % ophthalmic solution Place one drop into eye(s) twice daily as needed for Allergy symptoms.   ? rosuvastatin (CRESTOR) 10 mg tablet Take one tablet by mouth daily.   ? zinc sulfate 220 mg (50 mg elemental zinc) capsule Take 220 mg by mouth daily.     Vitals:    03/28/19 1459   BP: (!) 142/80   Pulse: 72   Resp: 16   Temp: 36.7 ?C (98.1 ?F)   SpO2: 98%   Weight: 77.1 kg (170 lb)   Height: 162.6 cm (64)   PainSc: Zero     Body mass index is 29.18 kg/m?Marland Kitchen     Physical Exam   WD/WN female in NAD    Psych A&OX3  Appropriate mood and affect  No lymphadenopathy Assessment and Plan:  Vasomotor symptoms, menopause related mood exacerbations of depression, weight gain.  We discussed hypoestrogen  mixed state does increase insulin levels which increases abdominal fat and can lead to type 2 diabetes.  Recommend check hemoglobin A1c.  Recommend she continue to follow with her primary care provider for her hypothyroidism to make sure that her symptomatic thyroid requirement is met to eliminate this cause for weight gain.  Concern with elevated levels of lipoprotein a and coronary artery disease.  If there is pre-existing coronary artery disease or carotid plaque formation hormone therapy could increase her risk for a heart attack or stroke.  I recommend she get the carotid IMT test before we consider hormone therapy.  I calculated her cardiovascular risk profile which was 4.9% in the next 10 years for an event.  We discussed nonoral forms of estrogen are safer then oral forms of estrogen.  We discussed alternatives to hormone therapy in the meantime until she is completed her work-up would be paroxetine, gabapentin, or clonidine.  She is already on Pristiq and not 1 add a second SNRI.  Trial gabapentin 300 mg at night for vasomotor symptoms and difficulty sleeping.  She is to contact the office when she has the carotid IMT test done and we can discuss hormone therapy further she would not need progesterone due to hysterectomy I would consider placing her on Metformin if hemoglobin A1c is elevated   Time spent counseling face to face greater than 50% visit. Time =50 minutes. Counseled topic outlined above

## 2019-03-28 NOTE — Patient Instructions
Call when you get the carotid IMT study and we can talk about starting hormone therapy

## 2019-03-29 ENCOUNTER — Encounter: Admit: 2019-03-29 | Discharge: 2019-03-29

## 2019-03-29 DIAGNOSIS — R635 Abnormal weight gain: Secondary | ICD-10-CM

## 2019-03-29 MED ORDER — CETIRIZINE 5 MG PO TAB
5 mg | ORAL_TABLET | Freq: Every morning | ORAL | 1 refills | Status: AC
Start: 2019-03-29 — End: ?

## 2019-03-29 MED ORDER — MOMETASONE 50 MCG/ACTUATION NA SPRY
1 | Freq: Two times a day (BID) | NASAL | 7 refills | 26.00000 days | Status: AC
Start: 2019-03-29 — End: ?

## 2019-03-29 MED ORDER — AZELASTINE 137 MCG (0.1 %) NA SPRA
1-2 | Freq: Two times a day (BID) | NASAL | 7 refills | 50.00000 days | Status: DC | PRN
Start: 2019-03-29 — End: 2019-09-05

## 2019-03-29 MED ORDER — MONTELUKAST 10 MG PO TAB
10 mg | ORAL_TABLET | Freq: Every evening | ORAL | 7 refills | 90.00000 days | Status: DC
Start: 2019-03-29 — End: 2019-09-05

## 2019-03-29 NOTE — Telephone Encounter
Patient's pharmacy [facility]) is requesting new prescriptions for the following medications: Nasonex, Cetirizine, Montelukast, and Azelastine.    LOV was 11/17/2018. Follow up is scheduled on 05/14/2019. Per LOV note:    "Continue Nasonex 2 sp en BID."  "Continue Zyrtec 10mg  daily."  "Continue singulair 10 mg once a day."  "Continue Astelin 1-2 sp en BID PRN rhinorrhea, PND, sneezing, or nasal itching."    Ok to refill per standing Allergy/Immunology refill protocol.

## 2019-03-30 ENCOUNTER — Encounter: Admit: 2019-03-30 | Discharge: 2019-03-30

## 2019-03-30 NOTE — Progress Notes
Diet counseling completed by phone today.    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  Exercise is limited.  She has been trying to go to the gym, but faced with possible shutdown, will have to explore activity options at home.  Says she has weights at home, and can do walking.  Recommend patient make a plan to walk at last 15 minutes a day, at least 3x/week, and to document completion on a calendar, along with a goal date (eg Christmas which is 5 weeks away).  Patient reports that she will be able to do this. Patient agrees that motivation to start is the pivotal movement.  Weight and eating issues are more complex.  She appears to have made efforts to eat healthy at breakfast and lunch, but dinner can be very high calorie.  Example, [employer] cheeseburger with Donzetta Sprung and an ice-cream concrete.  Coupled with possible increased frequency of choice of these types of high calorie meals, these lapses are more likely cause of weight gain instead of hormone balances.  However, hormone balances may drive her emotional choices and may also contribute to her hunger.  Patient reports that some days she cannot eat enough and others she does not feel very hungry but eats anyway. Encouraged patient to go on a discovery of which foods seem to trigger/satisfy her hunger and cravings.  With her history of eating disorder, she appears to have developed a fear of certain foods.  Patient will try to do short term challenges to see which diet practices she is more likely to adhere to, and possibly have a better relationship with.   Encouraged her to think of certain foods as a not being very good often, but still allowed occasionally, instead of being forbidden (which can inadvertently cause them to be more appealing)   Patient reports that she received information that she had not received before, and that it has made her think of some things a different way.  She believes that she can make modifications and work towards healthier eating habits.

## 2019-05-14 ENCOUNTER — Encounter: Admit: 2019-05-14 | Discharge: 2019-05-14

## 2019-05-14 ENCOUNTER — Ambulatory Visit: Admit: 2019-05-14 | Discharge: 2019-05-15 | Payer: Commercial Managed Care - HMO

## 2019-05-14 DIAGNOSIS — J309 Allergic rhinitis, unspecified: Secondary | ICD-10-CM

## 2019-05-14 NOTE — Progress Notes
Telehealth Visit Note    Date of Service: 05/14/2019    Subjective:      Obtained patient's verbal consent to treat them and their agreement to [facility] financial policy and NPP via this telehealth visit during the [facility]       History of Present Illness  It was a pleasure to see Holly Maldonado. As you know, they are a 59 y.o. patient who presents to  Allergy/Immunology for follow up allergic rhinoconjunctivitis. Their primary care provider is Dr. Steva Ready. Her allergy nose & eye symptoms are well controlled to her satisfaction on Nasonex daily, Astelin daily, Zyrtec daily, Singulair daily, and Patanol PRN.  Medications are tolerated without unacceptable side effects.  No nosebleeds, fatigue, mood/sleep changes attributed to medications.  She has some breakthrough allergy symptoms but not bothersome enough she wishes to start allergy shots or make medication changes at this time.  She prefers to stay the course.       Review of Systems   HENT: Positive for congestion.          Objective:         ? ascorbic acid (VITAMIN C) 500 mg tablet Take 500 mg by mouth daily.   ? azelastine (ASTELIN) 137 mcg (0.1 %) nasal spray Apply one spray to two sprays to each nostril as directed twice daily as needed.   ? cetirizine (ZYRTEC) 5 mg tablet Take one tablet by mouth every morning.   ? Cholecalciferol (Vitamin D3) 1,000 unit cap Take 5,000 Units by mouth.   ? clonazePAM (KLONOPIN) 0.5 mg tablet Take 0.5 mg by mouth twice daily.   ? coQ10 (ubiquinol) 100 mg cap Take 100 mg by mouth daily.   ? desvenlafaxine (PRISTIQ) 50 mg tablet Take 100 mg by mouth daily.   ? docosahexaenoic acid/epa (FISH OIL PO) Take 3 capsules by mouth daily.   ? ezetimibe (ZETIA) 10 mg tablet Take one tablet by mouth daily.   ? gabapentin (NEURONTIN) 300 mg capsule Take one capsule by mouth every 8 hours.   ? lactobacillus rhamnosus (GG) (CULTURELLE) 10 billion cell cap Take 1 capsule by mouth daily with breakfast. ? levothyroxine (SYNTHROID) 125 mcg tablet Take one tablet by mouth daily and recheck a TSH in 2 months   ? magnesium oxide (MAG-OX) 400 mg (241.3 mg magnesium) tablet Take 400 mg by mouth daily.   ? mometasone (NASONEX) 50 mcg/actuation nasal spray Apply one spray to each nostril as directed twice daily.   ? montelukast (SINGULAIR) 10 mg tablet Take one tablet by mouth at bedtime daily.   ? Multivitamins with Fluoride (MULTI-VITAMIN PO) Take  by mouth.   ? olopatadine (PATANOL) 0.1 % ophthalmic solution Place one drop into eye(s) twice daily as needed for Allergy symptoms.   ? rosuvastatin (CRESTOR) 10 mg tablet Take one tablet by mouth daily.   ? zinc sulfate 220 mg (50 mg elemental zinc) capsule Take 220 mg by mouth daily.     There were no vitals filed for this visit.  There is no height or weight on file to calculate BMI.         Physical Exam  Constitutional:       General: She is not in acute distress.     Appearance: Normal appearance.   HENT:      Head: Normocephalic and atraumatic.   Eyes:      Comments: No apparent conjunctivitis or eye discharge were able to be appreciated via limited Telehealth  video   Neck:      Musculoskeletal: Neck supple.   Pulmonary:      Effort: Pulmonary effort is normal.   Skin:     Comments: No apparent rash on exposed skin areas were able to be appreciated via limited Telehealth video   Neurological:      General: No focal deficit present.      Mental Status: She is alert.   Psychiatric:         Mood and Affect: Mood normal.         Behavior: Behavior normal.              Assessment and Plan:    Problem   Allergic Rhinoconjunctivitis    12-12-17 total IgE within normal limits   IgE class II or greater to: 2 dust mite species  05-25-18 aeroallergen SPT positive to 2 cockroach species, 2 dust mite species, and 2 molds with appropriate controls  05-25-18 aeroallergen ID positive to 2 trees, 2 grasses, 1 weed, cat, dog, 1 mold with appropriate controls Aeroallergen avoidance tips discussed and provided written copy previously.  Continue saline sinus rinses daily PRN using either boiled-then-cooled tap water or distilled water to mix with saline packets.  Continue Nasonex 2 sp en daily.  Continue Astelin 1-2 sp en BID PRN rhinorrhea, PND, sneezing, or nasal itching  Continue Patanol 1 dr Nolon Bussing eye BID PRN itchy/watery eyes  Continue Zyrtec 10mg  daily  Continue singulair 10 mg once a day.     Proper nasal inhaler technique was demonstrated to minimize risk of nosebleeds previously.  Potential adverse effects of medications were discussed and all questions were answered to the best of my ability previously.    She previously preferred to leave c-ANCA/p-ANCA and anti-MPO/anti-PR3 antibodies on the back burner.    We have previously discussed aeroallergen immunotherapy including the procedure, potential long term benefits, frequency of office visits needed during build up phase, potential slow onset of benefit, potential long duration of therapy/time commitment, cost, and potential risks including life-threatening anaphylaxis.  We previously discussed the recommendation to only get injections in a physician's office setting equipped to manage anaphylaxis should it occur, and the recommendation to carry autoinjectable epinephrine to all injection appointments.  She prefers to leave allergy shots on the back burner.              Patient Instructions   You may call us with any questions or concerns.   Your Allergist's nurse can be reached at [phone] during usual business hours.   If you need to reach the Allergist after usual business hours, you may call the hospital operator at [phone] and ask to speak with the Allergy fellow on-call.    Continue saline sinus rinses daily as-needed using either boiled-then-cooled tap water or distilled water to mix with saline packets.  Continue Nasonex 2 sprays each nostril once daily. Continue Astelin 1-2 sprays each nostril twice daily as needed for runny nose, sneezing, or nasal itching.   Continue Patanol 1 drop each eye twice daily as needed for itchy/watery eyes.  Continue Zyrtec 10mg  once daily.  Continue Singulair 10mg  once nightly.    Please contact me if your symptoms become suboptimally controlled, you develop any possible side effects of medications such as nosebleeds or blood-tinged mucus or others, and/or if you wish to start allergy shots.  Please contact me if you need anything prior to your next follow-up visit.      Return in about 1 year (  around 05/13/2020).  I offered to continue seeing Mrs. Llerena every 6 months versus spacing out her visits to once per year unless she needs anything sooner, and she prefers to space her visits to once per year unless she needs anything sooner.             16 minutes spent on this patient's encounter with counseling and coordination of care taking >50% of the visit.      Thank you for allowing [name] to participate in the care of this patient.  Please feel free to contact us if there are any questions or concerns about the patient.    [name], MD  Assistant Professor of Allergy & Clinical Immunology  Department of Internal Medicine   [facility]

## 2019-05-14 NOTE — Telephone Encounter
Attempted to call patient and left a voicemail with a call back number.

## 2019-05-14 NOTE — Patient Instructions
You may call us with any questions or concerns.   Your Allergist's nurse can be reached at [phone] during usual business hours.   If you need to reach the Allergist after usual business hours, you may call the hospital operator at [phone] and ask to speak with the Allergy fellow on-call.    Continue saline sinus rinses daily as-needed using either boiled-then-cooled tap water or distilled water to mix with saline packets.  Continue Nasonex 2 sprays each nostril once daily.  Continue Astelin 1-2 sprays each nostril twice daily as needed for runny nose, sneezing, or nasal itching.   Continue Patanol 1 drop each eye twice daily as needed for itchy/watery eyes.  Continue Zyrtec 10mg  once daily.  Continue Singulair 10mg  once nightly.    Please contact me if your symptoms become suboptimally controlled, you develop any possible side effects of medications such as nosebleeds or blood-tinged mucus or others, and/or if you wish to start allergy shots.  Please contact me if you need anything prior to your next follow-up visit.

## 2019-06-04 ENCOUNTER — Encounter: Admit: 2019-06-04 | Discharge: 2019-06-04

## 2019-06-04 MED ORDER — EZETIMIBE 10 MG PO TAB
10 mg | ORAL_TABLET | Freq: Every day | ORAL | 3 refills | Status: AC
Start: 2019-06-04 — End: ?

## 2019-06-16 ENCOUNTER — Encounter: Admit: 2019-06-16 | Discharge: 2019-06-16

## 2019-06-19 ENCOUNTER — Encounter: Admit: 2019-06-19 | Discharge: 2019-06-19

## 2019-08-14 ENCOUNTER — Encounter: Admit: 2019-08-14 | Discharge: 2019-08-14

## 2019-08-14 ENCOUNTER — Ambulatory Visit: Admit: 2019-08-14 | Discharge: 2019-08-14

## 2019-08-14 ENCOUNTER — Ambulatory Visit: Admit: 2019-08-14 | Discharge: 2019-08-14 | Payer: Commercial Managed Care - HMO

## 2019-08-14 DIAGNOSIS — E039 Hypothyroidism, unspecified: Secondary | ICD-10-CM

## 2019-08-14 DIAGNOSIS — N390 Urinary tract infection, site not specified: Secondary | ICD-10-CM

## 2019-08-14 DIAGNOSIS — I493 Ventricular premature depolarization: Secondary | ICD-10-CM

## 2019-08-14 DIAGNOSIS — F4310 Post-traumatic stress disorder, unspecified: Secondary | ICD-10-CM

## 2019-08-14 DIAGNOSIS — E7800 Pure hypercholesterolemia, unspecified: Secondary | ICD-10-CM

## 2019-08-14 DIAGNOSIS — E785 Hyperlipidemia, unspecified: Secondary | ICD-10-CM

## 2019-08-14 DIAGNOSIS — Q615 Medullary cystic kidney: Secondary | ICD-10-CM

## 2019-08-14 DIAGNOSIS — R000 Tachycardia, unspecified: Secondary | ICD-10-CM

## 2019-08-14 DIAGNOSIS — F329 Major depressive disorder, single episode, unspecified: Secondary | ICD-10-CM

## 2019-08-14 DIAGNOSIS — F99 Mental disorder, not otherwise specified: Secondary | ICD-10-CM

## 2019-08-14 DIAGNOSIS — I441 Atrioventricular block, second degree: Secondary | ICD-10-CM

## 2019-08-14 DIAGNOSIS — Z8249 Family history of ischemic heart disease and other diseases of the circulatory system: Secondary | ICD-10-CM

## 2019-08-14 DIAGNOSIS — G4730 Sleep apnea, unspecified: Secondary | ICD-10-CM

## 2019-08-14 DIAGNOSIS — E7841 Elevated Lipoprotein(a): Secondary | ICD-10-CM

## 2019-08-14 DIAGNOSIS — I499 Cardiac arrhythmia, unspecified: Secondary | ICD-10-CM

## 2019-08-14 DIAGNOSIS — Z8659 Personal history of other mental and behavioral disorders: Secondary | ICD-10-CM

## 2019-08-14 DIAGNOSIS — I25119 Atherosclerotic heart disease of native coronary artery with unspecified angina pectoris: Secondary | ICD-10-CM

## 2019-08-14 MED ORDER — ROSUVASTATIN 10 MG PO TAB
10 mg | ORAL_TABLET | Freq: Every day | ORAL | 3 refills | 90.00000 days | Status: DC
Start: 2019-08-14 — End: 2019-09-04

## 2019-08-14 NOTE — Progress Notes
Holter Placement Record  Ordering Physician: Artist Beach  Diagnosis: 1 second degree atrioventricular block   Brand: Cardionet or Cardiolab  Length: 48 hours  Holter Number: home enrollment  Card Number:   Holter start: 08/14/19 sent out  Location where Holter was placed: [facility]  Will Holter be returned by mail? Yes       BH

## 2019-08-14 NOTE — Progress Notes
Calcium score is 0 which is the best it can be.  Excellent news!

## 2019-09-04 MED ORDER — ROSUVASTATIN 10 MG PO TAB
10 mg | ORAL_TABLET | Freq: Every day | ORAL | 3 refills | 90.00000 days | Status: AC
Start: 2019-09-04 — End: ?

## 2019-09-05 MED ORDER — MONTELUKAST 10 MG PO TAB
10 mg | ORAL_TABLET | Freq: Every evening | ORAL | 3 refills | 90.00000 days | Status: DC
Start: 2019-09-05 — End: 2019-10-30

## 2019-09-05 MED ORDER — FLECAINIDE 100 MG PO TAB
100 mg | ORAL_TABLET | Freq: Two times a day (BID) | ORAL | 3 refills | 30.00000 days | Status: DC
Start: 2019-09-05 — End: 2019-10-02

## 2019-09-05 MED ORDER — AZELASTINE 137 MCG (0.1 %) NA SPRA
1-2 | Freq: Two times a day (BID) | NASAL | 3 refills | 50.00000 days | Status: AC | PRN
Start: 2019-09-05 — End: ?

## 2019-09-10 ENCOUNTER — Encounter: Admit: 2019-09-10 | Discharge: 2019-09-10

## 2019-09-10 DIAGNOSIS — Z20822 Encounter for screening laboratory testing for COVID-19 virus in asymptomatic patient: Secondary | ICD-10-CM

## 2019-09-10 NOTE — Telephone Encounter
Called and left pt a VM regarding upcoming stress echo, wanted to confirm if fuly vaccinated for Covid or would need to call and schedule a Covid swab prior to the procedure, left a CB number of [phone]

## 2019-09-11 ENCOUNTER — Encounter: Admit: 2019-09-11 | Discharge: 2019-09-11

## 2019-09-11 ENCOUNTER — Encounter: Admit: 2019-09-11 | Discharge: 2019-09-12 | Payer: Commercial Managed Care - HMO

## 2019-09-12 ENCOUNTER — Encounter: Admit: 2019-09-12 | Discharge: 2019-09-12

## 2019-09-12 NOTE — Telephone Encounter
Returned call to Dr. Rosary Lively. He did not answer on cell number. Spoke with someone at his office who said she would email a message to Dr. Rosary Lively to let him know that LDB is out of town for the week. Left our contact information for future needs.

## 2019-09-12 NOTE — Telephone Encounter
-----   Message from Golda Acre, LPN sent at 01/13/453  9:34 AM CDT -----  Regarding: LDB- having symptoms  VM from patient on triage line.  Said that she just does not feel good.  She is has indigestion for the couple of days, wheezing and SOA for couple of weeks.  Could she be seen today?  Call her at #[phone].

## 2019-09-12 NOTE — Telephone Encounter
-----   Message from Golda Acre, LPN sent at 6/0/4540  2:11 PM CDT -----  Regarding: LDB- call Dr. Rosary Lively  VM on triage line from Dr. Burna Cash, Psychiatrists in the [facility] area.  York Spaniel that he is wanting to change her medication.  He would like to talk to LDB about it.  His office [phone] but he is still working remote and can call him on his cell #[phone].

## 2019-09-12 NOTE — Telephone Encounter
Pt called this morning when she wasn't feeling well.   She c/o wheezing, cough and SOA - she has had these symptoms since she had Covid but they seemed a little worse this am.   Pt also has seasonal allergies and called allergist who referred her to cardiology.   Pt has been experiencing indigestion - she has had recent weight gain, 2 pieces of pizza and 2 pops yesterday which is out the norm for her - symptoms are relieved with OTC medication.    She says she did not get much sleep, and was very stressed and tearful yesterday.   Pt denies CP. She does have a history of anxiety and stating after talking through all this I feel much better and think I was probably really stressed out, tired, and anxious this morning.  Pt recent calcium score is 0, she is scheduled for stress test Friday.  She recently started flecainide for PVCs - no missed doses.    This am O2 = 95%, BP 133/77, HR 86  Instructed pt to proceed to ER if symptoms worsen and call with any changes or further questions.

## 2019-09-14 ENCOUNTER — Encounter: Admit: 2019-09-14 | Discharge: 2019-09-14

## 2019-09-14 ENCOUNTER — Ambulatory Visit: Admit: 2019-09-14 | Discharge: 2019-09-14 | Payer: Commercial Managed Care - HMO

## 2019-09-14 DIAGNOSIS — E7841 Elevated Lipoprotein(a): Secondary | ICD-10-CM

## 2019-09-14 DIAGNOSIS — E7800 Pure hypercholesterolemia, unspecified: Secondary | ICD-10-CM

## 2019-09-14 DIAGNOSIS — I493 Ventricular premature depolarization: Secondary | ICD-10-CM

## 2019-09-14 DIAGNOSIS — I441 Atrioventricular block, second degree: Secondary | ICD-10-CM

## 2019-09-14 MED ORDER — PERFLUTREN LIPID MICROSPHERES 1.1 MG/ML IV SUSP
1-20 mL | Freq: Once | INTRAVENOUS | 0 refills | Status: CP | PRN
Start: 2019-09-14 — End: ?
  Administered 2019-09-14: 17:00:00 3.5 mL via INTRAVENOUS

## 2019-09-14 NOTE — Progress Notes
Holter Placement Record  Ordering Physician: Miles Costain, MD  Diagnosis: PVC's  Brand:  Cardionet   Length: 24 hours  Holter Number: [id number]  Card Number: [id number]  Holter start: 09/14/2019 @12 :00 placed by MR  Location where Holter was placed: [facility]  Will Holter be returned by mail? Yes

## 2019-09-14 NOTE — Progress Notes
Cardiovascular Medicine [facility]      Pre-Assessment for  CAD, PVC's, second degree AV block    Name: Holly Maldonado MRN: [id number]  DOB: [birth year]  AGE: 58 y.o.  Date of Service: 09/14/2019    Referring Physician (MD/DO):  Greggory Brandy MD   Requesting Physician (MD/DO):     Date of Last Office/Hosp Visit:  09-05-2019       >24 Hour Assessment         Risk Factors:    Diabetes: no   Hypertension: no   Cholesterol: yes   Smoker: Yes, 1 ppd packs per day for /// years      Quit: Yes, date 08-22-1992           Since last CVM physician examination has the patient experienced:    Hospitalization: no     Emergency Room visit: no    Change in CV symptoms:    Chest pain: No on a scale of 1 to 10 (1 = low pain, 10 = severe pain)   SOB: yes   Palpitations: yes   Pre-syncope: no   Syncope: no   TIA/CVA: no   HTN: no     Other Indications/History: {NO    Change in Medication: no    Allergies:   Allergies   Allergen Reactions   ? Fetzima [Levomilnacipran] SEE COMMENTS     Per pt very depressed, couldn't stop crying.       Additional procedural comments: Pt not taking ADHD medicines at this time    >1 year since last OV or New Patient (High Risk Screen):  Nutritional Risk: [] None       Identified [] Unintentional Weight      Loss>10 lbs [x] Unintentional Weight        Gain>10 lbs [] Non-Healing       Wound   Fall Risk: [x] None       Identified [] Hx of falls within      last 6 mo. [] Impaired       Balance/Mobility [] Use of Assistive       Device   Safety Screen: [x] None       Identified [] Patient does not feel      safe at home [] Patient feels like harming      self or others

## 2019-09-16 ENCOUNTER — Encounter: Admit: 2019-09-16 | Discharge: 2019-09-16

## 2019-09-17 ENCOUNTER — Encounter: Admit: 2019-09-17 | Discharge: 2019-09-17

## 2019-09-17 NOTE — Progress Notes
Holter Placement Record  Ordering Physician: Clint Bolder  Diagnosis: PVC's   Brand:  Cardionet or Cardiolab  Length: 24 hours  Holter Number: home enrollment  Card Number:   Holter start: 09/17/19   Location where Holter was placed: [facility]  Will Holter be returned by mail? Yes       BH

## 2019-09-20 ENCOUNTER — Encounter: Admit: 2019-09-20 | Discharge: 2019-09-20

## 2019-09-21 ENCOUNTER — Ambulatory Visit: Admit: 2019-09-21 | Discharge: 2019-09-22 | Payer: Commercial Managed Care - HMO

## 2019-09-21 ENCOUNTER — Encounter: Admit: 2019-09-21 | Discharge: 2019-09-21

## 2019-09-21 DIAGNOSIS — R0600 Dyspnea, unspecified: Secondary | ICD-10-CM

## 2019-09-21 DIAGNOSIS — J309 Allergic rhinitis, unspecified: Secondary | ICD-10-CM

## 2019-09-21 MED ORDER — FLUTICASONE PROPIONATE 110 MCG/ACTUATION IN HFAA
2 | Freq: Two times a day (BID) | RESPIRATORY_TRACT | 0 refills | Status: DC
Start: 2019-09-21 — End: 2019-10-12

## 2019-09-21 MED ORDER — ALBUTEROL SULFATE 90 MCG/ACTUATION IN HFAA
2 | RESPIRATORY_TRACT | 0 refills | Status: DC | PRN
Start: 2019-09-21 — End: 2019-10-11

## 2019-09-21 MED ORDER — ALBUTEROL SULFATE 90 MCG/ACTUATION IN HFAA
2 | Freq: Once | RESPIRATORY_TRACT | 0 refills | Status: CP
Start: 2019-09-21 — End: ?
  Administered 2019-09-21: 15:00:00 2 via RESPIRATORY_TRACT

## 2019-09-21 NOTE — Progress Notes
Date of Service: 09/21/2019    Subjective:             It was a pleasure to see Holly Maldonado. As you know, they are a 59 y.o. patient who presents to [facility] for chief concern of:  Chief Complaint   Patient presents with   ? Allergies     Their primary care provider is Dr. Steva Ready.    History of Present Illness    She feels short of breath with exercise.  She had Covid19 infection in fall 2020 and feels she's had symptoms since then, and got worse after she got her Covid19 vaccine 6 weeks ago.  Symptoms are shortness of breath.   She saw her Cardiologist and had a stress test that was normal.  She discussed with her PCP who did a CXR that was normal.  She has no h/o asthma and has never required an albuterol inhaler before.  She feels herself wheezing sometimes. When lying down at night she can feel something with her throat.  She has reflux and post nasal drip.      Rhinitis Control Assessment Test  #1. During the past week, how often did you have nasal congestion?: 2 (11/07/2018  5:39 PM)  #2. During the past week, how often did you sneeze?: 2 (11/07/2018  5:39 PM)  #3. During the past week, how often did you have watery eyes?: 3 (11/07/2018  5:39 PM)  #4. During the past week, to what extent did your nasal or other allergy symptoms interfere with your sleep?: 5 (11/07/2018  5:39 PM)  #5. During the past week, how well were your nasal or other allergy symptoms controlled?: 3 (11/07/2018  5:39 PM)  #6. During the past week, how often did you avoid any activities (for example, visiting a house with a dog or cat, or gardening) because of your nasal or other allergy symptoms?: 5 (11/07/2018  5:39 PM)  RCAT Total Score: 20 (11/07/2018  5:39 PM)      Asthma Control Test  #1. In the past 4 weeks, how much of the time did your asthma keep you from getting as much done at work, school or at home?: 5 (11/07/2018  5:39 PM)  #2. During the past 4 weeks, how often have you had shortness of breath?: 4 (11/07/2018 5:39 PM)  #3. During the past 4 weeks, how often did your asthma symptoms (wheezing, coughing, shortness of breath, chest tightness or pain) wake you up at night or earlier than usual in the morning?: 5 (11/07/2018  5:39 PM)  #4. During the past 4 weeks, how often have you used your rescue inhaler or nebulizer medication (such as albuterol)?: 5 (11/07/2018  5:39 PM)  #5. How would you rate your asthma control during the past 4 weeks?: 5 (11/07/2018  5:39 PM)  Total Score: 24 (11/07/2018  5:39 PM)         Review of Systems   Constitutional: Positive for fatigue and unexpected weight change.   HENT: Positive for congestion, drooling, ear pain, rhinorrhea, sinus pressure and sore throat.    Eyes: Negative.    Respiratory: Positive for apnea, shortness of breath and wheezing.    Cardiovascular: Negative.    Gastrointestinal: Positive for abdominal distention and constipation.   Endocrine: Positive for cold intolerance, heat intolerance and polyphagia.   Genitourinary: Positive for urgency.   Musculoskeletal: Negative.    Skin: Negative.    Allergic/Immunologic: Negative.    Neurological: Positive for speech  difficulty and numbness.   Hematological: Negative.    Psychiatric/Behavioral: Positive for agitation, decreased concentration, dysphoric mood and sleep disturbance. The patient is nervous/anxious.    All other systems reviewed and are negative.        Objective:         ? albuterol sulfate (PROAIR HFA) 90 mcg/actuation HFA aerosol inhaler Inhale two puffs by mouth into the lungs every 4 hours as needed. Shake well before use.   ? azelastine (ASTELIN) 137 mcg (0.1 %) nasal spray Apply one spray to two sprays to each nostril as directed twice daily as needed.   ? cetirizine (ZYRTEC) 5 mg tablet Take one tablet by mouth every morning.   ? Cholecalciferol (Vitamin D3) 1,000 unit cap Take 5,000 Units by mouth.   ? clonazePAM (KLONOPIN) 0.5 mg tablet Take 0.5 mg by mouth twice daily.   ? desvenlafaxine (PRISTIQ) 50 mg tablet Take 100 mg by mouth daily.   ? docosahexaenoic acid/epa (FISH OIL PO) Take 3 capsules by mouth daily.   ? ezetimibe (ZETIA) 10 mg tablet Take one tablet by mouth daily.   ? flecainide (TAMBOCOR) 100 mg tablet Take one tablet by mouth twice daily.   ? fluticasone propionate (FLOVENT HFA) 110 mcg/actuation inhaler Inhale two puffs by mouth into the lungs twice daily.   ? levothyroxine (SYNTHROID) 125 mcg tablet Take one tablet by mouth daily and recheck a TSH in 2 months (Patient taking differently: Take 100 mcg by mouth daily.)   ? magnesium oxide (MAG-OX) 400 mg (241.3 mg magnesium) tablet Take 400 mg by mouth daily.   ? mometasone (NASONEX) 50 mcg/actuation nasal spray Apply one spray to each nostril as directed twice daily.   ? montelukast (SINGULAIR) 10 mg tablet Take one tablet by mouth at bedtime daily.   ? rosuvastatin (CRESTOR) 10 mg tablet Take one tablet by mouth daily.   ? zinc sulfate 220 mg (50 mg elemental zinc) capsule Take 220 mg by mouth daily.     Vitals:    09/21/19 0930   BP: 104/73   Pulse: 108   Resp: 15   SpO2: 98%   Height: 162.6 cm (64)     Body mass index is 34.74 kg/m?Marland Kitchen     Physical Exam  Vitals reviewed.   Constitutional:       General: She is not in acute distress.     Appearance: Normal appearance. She is well-developed. She is not diaphoretic.   HENT:      Head: Normocephalic and atraumatic.      Right Ear: Tympanic membrane, ear canal and external ear normal.      Left Ear: Tympanic membrane, ear canal and external ear normal.      Nose: Nose normal. No mucosal edema or rhinorrhea.      Mouth/Throat:      Mouth: Mucous membranes are moist.      Pharynx: No oropharyngeal exudate.      Comments: Vocal hoarseness present  Eyes:      General: No scleral icterus.        Right eye: No discharge.         Left eye: No discharge.      Conjunctiva/sclera: Conjunctivae normal.   Cardiovascular:      Rate and Rhythm: Normal rate and regular rhythm.      Heart sounds: Normal heart sounds. No murmur. No friction rub. No gallop.    Pulmonary:      Effort: Pulmonary effort is normal. No  respiratory distress.      Breath sounds: Normal breath sounds. No stridor. No wheezing, rhonchi or rales.      Comments: No change in pulmonary examination pre-albuterol vs post-albuterol given in office  Musculoskeletal:      Cervical back: Neck supple.   Skin:     General: Skin is warm and dry.      Findings: No rash.      Comments: No apparent rash on exposed skin areas of face, neck, hands   Neurological:      General: No focal deficit present.      Mental Status: She is alert.   Psychiatric:         Mood and Affect: Mood normal.         Behavior: Behavior normal.                           Assessment and Plan:    Problem   Dyspnea On Exertion    My suspicion that asthma is the etiology for Mrs. Cruzen's new onset dyspnea on exertion is low given that she has no h/o asthma, breathing symptoms, or requiring albuterol previously in her lifetime, and in-office spirometry is normal with no further significant reversibility post-bronchodilator, and pulmonary examination is normal with no change post-bronchodilator administration.  Also, Mrs. Hayenga did not feel any improvement in symptoms with albuterol 2p administered in clinic today.      I think she also would benefit from further follow up with PCP for further evaluation of dyspnea on exertion.    The being said, because she's had a normal cardiac evaluation with Holter monitor & stress echo per her cardiologist, normal CXR per her PCP, and asthma was considered on the differential diagnosis, we'll trial asthma inhaler with Flovent 2p BID x2 weeks and then follow up to see if any improvement in symptoms.  Advised to rinse out mouth/brush teeth after use and always use with a spacer device to help prevent thrush.      She may also use albuterol 2 puffs every 4 hours as-needed for wheezing, chest tightness, shortness of breath, or cough.   Contact us the 1st time albuterol doesn't last a full 4 hours or doesn't completely relieve symptoms.   Contact us if requiring albuterol > 2 times per week for rescue of any day time asthma symptoms.   Contact us if requiring albuterol > 2 times per month for rescue of any night-time awakenings due to asthma symptoms.      Allergic Rhinoconjunctivitis    12-12-17 total IgE within normal limits   IgE class II or greater to: 2 dust mite species  05-25-18 aeroallergen SPT positive to 2 cockroach species, 2 dust mite species, and 2 molds with appropriate controls  05-25-18 aeroallergen ID positive to 2 trees, 2 grasses, 1 weed, cat, dog, 1 mold with appropriate controls    Aeroallergen avoidance tips discussed and provided written copy previously.  Continue saline sinus rinses daily PRN using either boiled-then-cooled tap water or distilled water to mix with saline packets.  Continue Nasonex 2 sp en daily.  Continue Astelin 1-2 sp en BID PRN rhinorrhea, PND, sneezing, or nasal itching  Continue Patanol 1 dr Nolon Bussing eye BID PRN itchy/watery eyes  Continue Zyrtec 10mg  daily  Continue singulair 10 mg once a day.     Proper nasal inhaler technique was demonstrated to minimize risk of nosebleeds previously.  Potential adverse effects of medications  were discussed and all questions were answered to the best of my ability previously.    She previously preferred to leave c-ANCA/p-ANCA and anti-MPO/anti-PR3 antibodies on the back burner.  As I'm not a Rheumatologist, I will defer further evaluations for this to her PCP's discretion and/or referral to Rheumatologist to PCP's discretion.          Orders Placed This Encounter   ? SPIROMETRY   ? albuterol sulfate (PROAIR HFA) inhaler 2 puff   ? albuterol sulfate (PROAIR HFA) 90 mcg/actuation HFA aerosol inhaler   ? fluticasone propionate (FLOVENT HFA) 110 mcg/actuation inhaler       Return in about 2 weeks (around 10/05/2019) for telephone call , Telehealth.      Patient Instructions   Start Flovent 2 puffs twice daily with spacer.  Rinse mouth/brush teeth after each use.  Start albuterol 2 puffs every 4 hours as-needed for wheezing, chest tightness, shortness of breath, or cough.   Contact us the 1st time albuterol doesn't last a full 4 hours or doesn't completely relieve symptoms.   Contact us if requiring albuterol > 2 times per week for rescue of any day time asthma symptoms.   Contact us if requiring albuterol > 2 times per month for rescue of any night-time awakenings due to asthma symptoms.         Thank you for allowing [name] to participate in the care of this patient.  Please feel free to contact us if there are any questions or concerns about the patient.    [name], MD  Assistant Professor of Allergy & Clinical Immunology  Department of Internal Medicine   [facility]

## 2019-09-21 NOTE — Patient Instructions
Start Flovent 2 puffs twice daily with spacer.  Rinse mouth/brush teeth after each use.  Start albuterol 2 puffs every 4 hours as-needed for wheezing, chest tightness, shortness of breath, or cough.   Contact us the 1st time albuterol doesn't last a full 4 hours or doesn't completely relieve symptoms.   Contact us if requiring albuterol > 2 times per week for rescue of any day time asthma symptoms.   Contact us if requiring albuterol > 2 times per month for rescue of any night-time awakenings due to asthma symptoms.

## 2019-09-22 DIAGNOSIS — Z8709 Personal history of other diseases of the respiratory system: Principal | ICD-10-CM

## 2019-09-30 ENCOUNTER — Encounter: Admit: 2019-09-30 | Discharge: 2019-09-30

## 2019-10-02 ENCOUNTER — Encounter: Admit: 2019-10-02 | Discharge: 2019-10-02

## 2019-10-02 MED ORDER — FLECAINIDE 100 MG PO TAB
100 mg | ORAL_TABLET | Freq: Two times a day (BID) | ORAL | 3 refills | 30.00000 days | Status: DC
Start: 2019-10-02 — End: 2019-10-17

## 2019-10-03 ENCOUNTER — Encounter: Admit: 2019-10-03 | Discharge: 2019-10-03

## 2019-10-03 MED ORDER — OLOPATADINE 0.1 % OP DROP
1 [drp] | Freq: Two times a day (BID) | OPHTHALMIC | 11 refills | 27.50000 days | Status: DC | PRN
Start: 2019-10-03 — End: 2019-11-16

## 2019-10-03 NOTE — Telephone Encounter
Pharmacy requesting a refill of patanol eye drops. Patient last seen 09/21/19. Follow up scheduled 10/05/19. Per last OV note, Continue Patanol 1 dr Nolon Bussing eye BID PRN itchy/watery eyes. Refilling patanol per Allergy/Immunology Standing Orders Protocol for 11 months.

## 2019-10-04 ENCOUNTER — Encounter: Admit: 2019-10-04 | Discharge: 2019-10-04

## 2019-10-05 ENCOUNTER — Ambulatory Visit: Admit: 2019-10-05 | Discharge: 2019-10-06 | Payer: Commercial Managed Care - HMO

## 2019-10-05 DIAGNOSIS — J309 Allergic rhinitis, unspecified: Secondary | ICD-10-CM

## 2019-10-05 DIAGNOSIS — R0600 Dyspnea, unspecified: Secondary | ICD-10-CM

## 2019-10-05 NOTE — Progress Notes
Telehealth Visit Note    Date of Service: 10/05/2019    Subjective:      Obtained patient's verbal consent to treat them and their agreement to [facility] financial policy and NPP via this telehealth visit during the [facility] Emergency       Holly Maldonado is a 59 y.o. female who presents for scheduled telephone call follow up visit.    History of Present Illness    She forgot we had this telephone call appointment today, but says this is an okay time to talk for about 5 minutes.  Her SOB is getting better.  She went to PCP and they did a CXR and it's clear so he thinks she needs to wait a few weeks.  She used Flovent a few days and didn't notice any immediate benefit so she stopped using it after just a few days.  She did not realize she would need to use it consistently for 2 weeks to be able to tell if it was helpful or not.  She is walking during the phone call and she says after all her shortness of breath may not be getting better like she thought that she feels a little bit winded with walking.  We discussed possible option of not starting Flovent and pursuing a methacholine challenge.  After discussion of methods, merits, and potential risks of either starting Flovent for 2-3 weeks in a follow-up visit versus methacholine challenge, she would like to do Flovent.       Review of Systems      Objective:         ? albuterol sulfate (PROAIR HFA) 90 mcg/actuation HFA aerosol inhaler Inhale two puffs by mouth into the lungs every 4 hours as needed. Shake well before use.   ? azelastine (ASTELIN) 137 mcg (0.1 %) nasal spray Apply one spray to two sprays to each nostril as directed twice daily as needed.   ? cetirizine (ZYRTEC) 5 mg tablet Take one tablet by mouth every morning.   ? Cholecalciferol (Vitamin D3) 1,000 unit cap Take 5,000 Units by mouth.   ? clonazePAM (KLONOPIN) 0.5 mg tablet Take 0.5 mg by mouth twice daily.   ? desvenlafaxine (PRISTIQ) 50 mg tablet Take 100 mg by mouth daily.   ? docosahexaenoic acid/epa (FISH OIL PO) Take 3 capsules by mouth daily.   ? ezetimibe (ZETIA) 10 mg tablet Take one tablet by mouth daily.   ? flecainide (TAMBOCOR) 100 mg tablet Take one tablet by mouth twice daily.   ? fluticasone propionate (FLOVENT HFA) 110 mcg/actuation inhaler Inhale two puffs by mouth into the lungs twice daily.   ? levothyroxine (SYNTHROID) 125 mcg tablet Take one tablet by mouth daily and recheck a TSH in 2 months (Patient taking differently: Take 100 mcg by mouth daily.)   ? magnesium oxide (MAG-OX) 400 mg (241.3 mg magnesium) tablet Take 400 mg by mouth daily.   ? mometasone (NASONEX) 50 mcg/actuation nasal spray Apply one spray to each nostril as directed twice daily.   ? montelukast (SINGULAIR) 10 mg tablet Take one tablet by mouth at bedtime daily.   ? olopatadine (PATANOL) 0.1 % ophthalmic solution Place one drop into or around eye(s) twice daily as needed for Allergy symptoms.   ? rosuvastatin (CRESTOR) 10 mg tablet Take one tablet by mouth daily.   ? zinc sulfate 220 mg (50 mg elemental zinc) capsule Take 220 mg by mouth daily.     There were no vitals filed  for this visit.  There is no height or weight on file to calculate BMI.         Physical Exam  Speaking in full clear complete sentences, no respiratory distress       Assessment and Plan:    Problem   Dyspnea On Exertion    My suspicion that asthma is the etiology for Mrs. Daughtridge's new onset dyspnea on exertion is low given that she has no h/o asthma, breathing symptoms, or requiring albuterol previously in her lifetime, and in-office spirometry is normal with no further significant reversibility post-bronchodilator, and pulmonary examination is normal with no change post-bronchodilator administration.  Mrs. Fella did not feel any improvement in symptoms with albuterol 2p administered in clinic last visit.    The being said, because she's had a normal cardiac evaluation with Holter monitor & stress echo per her cardiologist, normal CXR per her PCP, and asthma was considered on the differential diagnosis, we'll trial asthma inhaler with Flovent 2p BID x2-3 weeks and then follow up to see if any improvement in symptoms.  Advised to rinse out mouth/brush teeth after use and always use with a spacer device to help prevent thrush.      We discussed methods, merits, and potential risks/cost of methacholine challenge and she prefers to leave this on the back burner.    She may also use albuterol 2 puffs every 4 hours as-needed for wheezing, chest tightness, shortness of breath, or cough.   Contact us the 1st time albuterol doesn't last a full 4 hours or doesn't completely relieve symptoms.   Contact us if requiring albuterol > 2 times per week for rescue of any day time asthma symptoms.   Contact us if requiring albuterol > 2 times per month for rescue of any night-time awakenings due to asthma symptoms.      Allergic Rhinoconjunctivitis    12-12-17 total IgE within normal limits   IgE class II or greater to: 2 dust mite species  05-25-18 aeroallergen SPT positive to 2 cockroach species, 2 dust mite species, and 2 molds with appropriate controls  05-25-18 aeroallergen ID positive to 2 trees, 2 grasses, 1 weed, cat, dog, 1 mold with appropriate controls    Continue aero allergen avoidance measures.  Continue saline sinus rinses daily PRN using either boiled-then-cooled tap water or distilled water to mix with saline packets.  Continue Nasonex 2 sp en daily.  Continue Astelin 1-2 sp en BID PRN rhinorrhea, PND, sneezing, or nasal itching  Continue Patanol 1 dr Nolon Bussing eye BID PRN itchy/watery eyes  Continue Zyrtec 10mg  daily  Continue singulair 10 mg once a day.     Proper nasal inhaler technique was demonstrated to minimize risk of nosebleeds previously.  Potential adverse effects of medications were discussed and all questions were answered to the best of my ability previously.    She previously preferred to leave c-ANCA/p-ANCA and anti-MPO/anti-PR3 antibodies on the back burner.  As I'm not a Rheumatologist, I will defer further evaluations for this to her PCP's discretion and/or referral to Rheumatologist to PCP's discretion.                            Return in about 3 weeks (around 10/26/2019) for Telehealth.      5 minutes spent on this patient's encounter with counseling and coordination of care taking >50% of the visit.      Thank you for allowing [name] to participate  in the care of this patient.  Please feel free to contact us if there are any questions or concerns about the patient.    [name], MD  Assistant Professor of Allergy & Clinical Immunology  Department of Internal Medicine   [facility]

## 2019-10-09 ENCOUNTER — Encounter: Admit: 2019-10-09 | Discharge: 2019-10-09

## 2019-10-09 ENCOUNTER — Ambulatory Visit: Admit: 2019-10-09 | Discharge: 2019-10-09 | Payer: Commercial Managed Care - HMO

## 2019-10-09 DIAGNOSIS — Q615 Medullary cystic kidney: Secondary | ICD-10-CM

## 2019-10-09 DIAGNOSIS — E785 Hyperlipidemia, unspecified: Secondary | ICD-10-CM

## 2019-10-09 DIAGNOSIS — Z8249 Family history of ischemic heart disease and other diseases of the circulatory system: Secondary | ICD-10-CM

## 2019-10-09 DIAGNOSIS — Z8659 Personal history of other mental and behavioral disorders: Secondary | ICD-10-CM

## 2019-10-09 DIAGNOSIS — F4310 Post-traumatic stress disorder, unspecified: Secondary | ICD-10-CM

## 2019-10-09 DIAGNOSIS — N390 Urinary tract infection, site not specified: Secondary | ICD-10-CM

## 2019-10-09 DIAGNOSIS — F99 Mental disorder, not otherwise specified: Secondary | ICD-10-CM

## 2019-10-09 DIAGNOSIS — E7841 Elevated Lipoprotein(a): Secondary | ICD-10-CM

## 2019-10-09 DIAGNOSIS — I499 Cardiac arrhythmia, unspecified: Secondary | ICD-10-CM

## 2019-10-09 DIAGNOSIS — I471 Supraventricular tachycardia: Secondary | ICD-10-CM

## 2019-10-09 DIAGNOSIS — R000 Tachycardia, unspecified: Secondary | ICD-10-CM

## 2019-10-09 DIAGNOSIS — F329 Major depressive disorder, single episode, unspecified: Secondary | ICD-10-CM

## 2019-10-09 DIAGNOSIS — G4730 Sleep apnea, unspecified: Secondary | ICD-10-CM

## 2019-10-09 DIAGNOSIS — E7800 Pure hypercholesterolemia, unspecified: Secondary | ICD-10-CM

## 2019-10-09 DIAGNOSIS — E039 Hypothyroidism, unspecified: Secondary | ICD-10-CM

## 2019-10-10 ENCOUNTER — Encounter: Admit: 2019-10-10 | Discharge: 2019-10-10

## 2019-10-10 DIAGNOSIS — I493 Ventricular premature depolarization: Secondary | ICD-10-CM

## 2019-10-10 NOTE — Telephone Encounter
MyChart message sent to patient regarding below and need to have another holter placed.

## 2019-10-10 NOTE — Telephone Encounter
Alison Murray, Min  [place], [place], RN   [facility],   Got a message from our Holter representatives and they feel it would be best for a clean start because she already had three enrollments, two of which have been cancelled. I'm going to enroll her here shortly and hopefully and we can get that ship expedited. Can you have send in the Holters she has now. Thanks, [employer]

## 2019-10-10 NOTE — Telephone Encounter
MyChart message sent to pt

## 2019-10-10 NOTE — Progress Notes
Holter Placement Record  Ordering Physician:  Miles Costain  Diagnosis:PVCs  Brand:  Cardionet   Length: 24 hours  Holter Number: home enrollment  Card Number:    Holter start:   Location where Holter was placed: [facility]  Will Holter be returned by mail? Yes

## 2019-10-10 NOTE — Telephone Encounter
-----   Message from [place] sent at 10/10/2019 10:59 AM CDT -----  Gerre Pebbles,  Hopes this helps from our [employer] representatives:   I just heard back from the Holter Department, and IT did not find a file associated with this enrollment. They did in cancel the enrollment, but a file was never uploaded to it.  When the [facility] team requested it to be canceled a few weeks ago, the device could've come back during that time, but if Distribution has a canceled enrollment they are not able to upload any data to it.    I hope that makes sense! Patient will need to repeat the study. No charges have been incurred for the 5/11 enrollment.

## 2019-10-11 MED ORDER — ALBUTEROL SULFATE 90 MCG/ACTUATION IN HFAA
1 refills | Status: DC | PRN
Start: 2019-10-11 — End: 2019-10-30

## 2019-10-12 ENCOUNTER — Encounter: Admit: 2019-10-12 | Discharge: 2019-10-12

## 2019-10-12 MED ORDER — FLOVENT HFA 110 MCG/ACTUATION IN HFAA
Freq: Two times a day (BID) | 0 refills | Status: AC
Start: 2019-10-12 — End: ?

## 2019-10-12 NOTE — Telephone Encounter
Pharmacy is requesting a refill of Flovent. Patient last seen 09-21-19. Follow up scheduled 10-26-19. Per last OV note, Start Flovent 2 puffs twice daily with spacer.  Rinse mouth/brush teeth after each use. Refilling flovent per Allergy/Immunology Standing Orders Protocol

## 2019-10-17 ENCOUNTER — Encounter: Admit: 2019-10-17 | Discharge: 2019-10-17

## 2019-10-17 ENCOUNTER — Ambulatory Visit: Admit: 2019-10-17 | Discharge: 2019-10-18

## 2019-10-17 DIAGNOSIS — Z8659 Personal history of other mental and behavioral disorders: Secondary | ICD-10-CM

## 2019-10-17 DIAGNOSIS — E039 Hypothyroidism, unspecified: Secondary | ICD-10-CM

## 2019-10-17 DIAGNOSIS — Q615 Medullary cystic kidney: Secondary | ICD-10-CM

## 2019-10-17 DIAGNOSIS — F99 Mental disorder, not otherwise specified: Secondary | ICD-10-CM

## 2019-10-17 DIAGNOSIS — F4310 Post-traumatic stress disorder, unspecified: Secondary | ICD-10-CM

## 2019-10-17 DIAGNOSIS — R000 Tachycardia, unspecified: Secondary | ICD-10-CM

## 2019-10-17 DIAGNOSIS — E7800 Pure hypercholesterolemia, unspecified: Secondary | ICD-10-CM

## 2019-10-17 DIAGNOSIS — I499 Cardiac arrhythmia, unspecified: Secondary | ICD-10-CM

## 2019-10-17 DIAGNOSIS — G4730 Sleep apnea, unspecified: Secondary | ICD-10-CM

## 2019-10-17 DIAGNOSIS — N951 Menopausal and female climacteric states: Principal | ICD-10-CM

## 2019-10-17 DIAGNOSIS — E785 Hyperlipidemia, unspecified: Secondary | ICD-10-CM

## 2019-10-17 DIAGNOSIS — N390 Urinary tract infection, site not specified: Secondary | ICD-10-CM

## 2019-10-17 DIAGNOSIS — F329 Major depressive disorder, single episode, unspecified: Secondary | ICD-10-CM

## 2019-10-17 MED ORDER — ESTRADIOL 0.05 MG/24 HR TD PTSW
1 | MEDICATED_PATCH | TRANSDERMAL | 11 refills | 43.00000 days | Status: DC
Start: 2019-10-17 — End: 2019-10-18

## 2019-10-17 MED ORDER — FLECAINIDE 100 MG PO TAB
100 mg | ORAL_TABLET | Freq: Two times a day (BID) | ORAL | 0 refills | 30.00000 days | Status: DC
Start: 2019-10-17 — End: 2019-11-09

## 2019-10-17 NOTE — Progress Notes
Obtained patient's verbal consent to treat them and their agreement to [place] and NPP via this telehealth visit during the [facility] Emergency  Pt provided vitals. Pt okay with student

## 2019-10-17 NOTE — Progress Notes
Telehealth Visit Note    Date of Service: 10/17/2019    Subjective:      Obtained patient's verbal consent to treat them and their agreement to [facility] [employer] financial policy and NPP via this telehealth visit during the [facility] Emergency       Holly Maldonado is a 59 y.o. female.    History of Present Illness  Here for menopause management . Got CT cardiac calcium score which came back as 0.  She is struggling with weight gain and has an eating disorder.  She did not attend the eating disorders clinic in the spring and plans to go back in the next month.  She is experiencing hot flashes night sweats difficulty sleeping which are severe.  She also is experiencing fatigue.  She states her primary care provider was okay with her using some hormone therapy to help with her symptoms.  Status post total hysterectomy.  Last mammogram in January Holly Maldonado states it was normal.       Review of Systems   Constitutional: Positive for appetite change, fatigue and unexpected weight change.   HENT: Negative.    Eyes: Negative.    Respiratory: Positive for shortness of breath and wheezing.    Cardiovascular: Positive for palpitations.   Gastrointestinal: Negative.    Endocrine: Positive for heat intolerance.   Genitourinary: Negative.    Musculoskeletal: Negative.    Skin: Negative.    Allergic/Immunologic: Negative.    Neurological: Negative.    Hematological: Negative.    Psychiatric/Behavioral: Positive for sleep disturbance. The patient is nervous/anxious.      Medical History:   Diagnosis Date   ? Arrhythmia     multiple PVCs from EKG 08/14/12   ? depression    ? Dyslipidemia    ? Endometriosis    ? H/O eating disorder    ? Hematuria     microscopic   ? High cholesterol    ? Hypothyroid    ? Medullary sponge kidney    ? Mental disorder    ? Nephrocalcinosis    ? PTSD (post-traumatic stress disorder)    ? Recurrent UTI    ? Sleep apnea    ? Tachycardia      Surgical History:   Procedure Laterality Date   ? HX TUBAL LIGATION  2010   ? COLONOSCOPY DIAGNOSTIC WITH SPECIMEN COLLECTION BY BRUSHING/ WASHING - FLEXIBLE N/A 12/15/2018    Performed by Veneta Penton, MD at [facility] OR   ? ELECTROCARDIOGRAM     ? HX BLADDER SUSPENSION     ? HX CESAREAN SECTION     ? HX HYSTERECTOMY     ? HX SINUS SURGERY     ? HX TONSILLECTOMY     ? LAPAROSCOPY       Social History     Tobacco Use   ? Smoking status: Former Smoker     Packs/day: 1.00     Types: Cigarettes     Quit date: 08/22/1992     Years since quitting: 27.1   ? Smokeless tobacco: Never Used   Substance Use Topics   ? Alcohol use: No   ? Drug use: No     Family History   Problem Relation Age of Onset   ? Coronary Artery Disease Father    ? Diabetes Type II Mother    ? Cancer Mother         leukemia   ? Diabetes Mother    ? Heart  Attack Mother    ? Coronary Artery Disease Maternal Grandmother    ? Circulatory problem Maternal Grandmother    ? Coronary Artery Disease Maternal Grandfather    ? Heart Attack Maternal Grandfather    ? Heart Surgery Maternal Grandfather    ? Coronary Artery Disease Paternal Grandmother    ? Coronary Artery Disease Paternal Grandfather          Objective:         ? albuterol sulfate (PROAIR HFA) 90 mcg/actuation HFA aerosol inhaler INHALE 2 PUFFS BY MOUTH IN THE LUNGS EVERY 4 HOURS AS NEEDED   ? ARIPiprazole (ABILIFY) 5 mg tablet Take 5 mg by mouth daily.   ? aspirin EC 81 mg tablet Take 81 mg by mouth daily. Take with food.   ? azelastine (ASTELIN) 137 mcg (0.1 %) nasal spray Apply one spray to two sprays to each nostril as directed twice daily as needed.   ? cetirizine (ZYRTEC) 5 mg tablet Take one tablet by mouth every morning.   ? Cholecalciferol (Vitamin D3) 1,000 unit cap Take 5,000 Units by mouth.   ? clonazePAM (KLONOPIN) 0.5 mg tablet Take 0.5 mg by mouth twice daily.   ? desvenlafaxine (PRISTIQ) 50 mg tablet Take 100 mg by mouth daily.   ? docosahexaenoic acid/epa (FISH OIL PO) Take 3 capsules by mouth daily.   ? ezetimibe (ZETIA) 10 mg tablet Take one tablet by mouth daily.   ? flecainide (TAMBOCOR) 100 mg tablet Take one tablet by mouth twice daily.   ? FLOVENT HFA 110 mcg/actuation inhaler INHALE 2 PUFFS BY MOUTH INTO THE LUNGS TWICE DAILY   ? levothyroxine (SYNTHROID) 125 mcg tablet Take one tablet by mouth daily and recheck a TSH in 2 months (Patient taking differently: Take 100 mcg by mouth daily.)   ? magnesium oxide (MAG-OX) 400 mg (241.3 mg magnesium) tablet Take 400 mg by mouth daily.   ? metFORMIN (GLUCOPHAGE) 500 mg tablet Take 500 mg by mouth daily with breakfast.   ? mometasone (NASONEX) 50 mcg/actuation nasal spray Apply one spray to each nostril as directed twice daily.   ? montelukast (SINGULAIR) 10 mg tablet Take one tablet by mouth at bedtime daily.   ? olopatadine (PATANOL) 0.1 % ophthalmic solution Place one drop into or around eye(s) twice daily as needed for Allergy symptoms.   ? rosuvastatin (CRESTOR) 10 mg tablet Take one tablet by mouth daily.   ? zinc sulfate 220 mg (50 mg elemental zinc) capsule Take 220 mg by mouth daily.     There were no vitals filed for this visit.  There is no height or weight on file to calculate BMI.     Telehealth Patient Reported Vitals     Row Name 10/17/19 1121                Weight:  98 kg (216 lb)        Height:  162.6 cm (64)        Pain Score:  Zero              Physical Exam         Assessment and Plan:  Impression is symptomatic vasomotor symptoms secondary to menopause.  Given her elevated triglycerides, I do not recommend oral estrogen as it will further raise her triglycerides.  There is less risk of a thromboembolic event with nonoral forms of estrogen.  We decided together to start Vivelle-Dot 0.05 mg daily change patch twice a week if  she is still experiencing bothersome symptoms over the next month or so, can increase to 0.075 mg daily return to clinic 6 months                       30 minutes spent on this patient's encounter with counseling and coordination of care taking >50% of the visit.

## 2019-10-17 NOTE — Telephone Encounter
-----   Message from [place] A. Warshawsky sent at 10/17/2019  8:49 AM CDT -----  Regarding: Prescription Question  Contact: [phone]  I am completely out of flecainide 100 mg tablet and my insurance is messed up. Can you please call a script into [facility] in [place]. Jomarie Longs that is the cheapest with [facility]    Any questions call me at [phone]    Thanks [name]

## 2019-10-18 ENCOUNTER — Encounter: Admit: 2019-10-18 | Discharge: 2019-10-18

## 2019-10-18 MED ORDER — ESTRADIOL 0.05 MG/24 HR TD PTSW
1 | MEDICATED_PATCH | TRANSDERMAL | 11 refills | 30.00000 days | Status: DC
Start: 2019-10-18 — End: 2019-11-20

## 2019-10-23 ENCOUNTER — Encounter: Admit: 2019-10-23 | Discharge: 2019-10-23

## 2019-10-29 ENCOUNTER — Encounter: Admit: 2019-10-29 | Discharge: 2019-10-29

## 2019-10-30 ENCOUNTER — Encounter: Admit: 2019-10-30 | Discharge: 2019-10-30

## 2019-10-30 ENCOUNTER — Ambulatory Visit: Admit: 2019-10-30 | Discharge: 2019-10-31

## 2019-10-30 DIAGNOSIS — J309 Allergic rhinitis, unspecified: Secondary | ICD-10-CM

## 2019-10-30 DIAGNOSIS — R0600 Dyspnea, unspecified: Secondary | ICD-10-CM

## 2019-10-30 MED ORDER — ALBUTEROL SULFATE 90 MCG/ACTUATION IN HFAA
2 | RESPIRATORY_TRACT | 0 refills | Status: AC | PRN
Start: 2019-10-30 — End: ?

## 2019-10-30 NOTE — Progress Notes
Obtained patient's verbal consent to treat them and their agreement to [facility] financial policy and NPP via this telehealth visit during the [facility] Emergency

## 2019-10-30 NOTE — Progress Notes
Telehealth Visit Note    Date of Service: 10/30/2019    Subjective:      Obtained patient's verbal consent to treat them and their agreement to [facility] financial policy and NPP via this telehealth visit during the [facility] Emergency       Holly Maldonado is a 59 y.o. female who presents for follow up.    History of Present Illness    Her shortness of breath and dyspnea on exertion are a lot better, really doesn't have these symptoms at all now, since taking Flovent 2p BID.  Albuterol was used a few times in the past few weeks and helpful, she thinks, when used.  She's nearly out of Flovent & albuterol.    Last week, she went to her PCP and he advised her to discontinue Singulair.  She has h/o mental health issues and she thinks her PCP thinks those had gotten worse since starting Singulair.     Since stopping Singulair, she's developed worsened allergy symptoms of nasal congestion, PND, hoarse voice, non-productive cough, ear discomfort, dizziness, head hurts, and harder to sleep.  She denies fever, thick bad-tasting or bad-smelling green or brown drainage.  These symptoms do not feel like an infection to her.  These symptoms feel like her allergies are worse since stopping Singulair.  She hasn't tried albuterol for the cough.    She's using saline sinus rinses, Nasonex 1p BID, Astelin 1p BID, Zyrtec 10mg  BID, and Patanol PRN.  Medications are tolerated without unacceptable side effects.  She used to take sudafed & helped & tolerated, but her Cardiologist she sees for PVC's on Flecainide advised to not take in the past.  She hasn't discussed with them recently.           Review of Systems   Constitutional: Negative for fever.   HENT: Positive for congestion, ear pain, postnasal drip and voice change. Negative for rhinorrhea and sneezing.    Respiratory: Positive for cough. Negative for shortness of breath.    Allergic/Immunologic: Positive for environmental allergies.   Neurological: Positive for headaches.         Objective:         ? albuterol sulfate (PROAIR HFA) 90 mcg/actuation HFA aerosol inhaler INHALE 2 PUFFS BY MOUTH IN THE LUNGS EVERY 4 HOURS AS NEEDED   ? aspirin EC 81 mg tablet Take 81 mg by mouth daily. Take with food.   ? azelastine (ASTELIN) 137 mcg (0.1 %) nasal spray Apply one spray to two sprays to each nostril as directed twice daily as needed.   ? cetirizine (ZYRTEC) 5 mg tablet Take one tablet by mouth every morning.   ? Cholecalciferol (Vitamin D3) 1,000 unit cap Take 5,000 Units by mouth.   ? clonazePAM (KLONOPIN) 0.5 mg tablet Take 0.5 mg by mouth twice daily.   ? desvenlafaxine (PRISTIQ) 50 mg tablet Take 100 mg by mouth daily.   ? docosahexaenoic acid/epa (FISH OIL PO) Take 3 capsules by mouth daily.   ? estradioL (VIVELLE-DOT) 0.05 mg/24 hr patch Apply one patch to top of skin as directed twice weekly.   ? ezetimibe (ZETIA) 10 mg tablet Take one tablet by mouth daily.   ? flecainide (TAMBOCOR) 100 mg tablet Take one tablet by mouth twice daily.   ? FLOVENT HFA 110 mcg/actuation inhaler INHALE 2 PUFFS BY MOUTH INTO THE LUNGS TWICE DAILY   ? levothyroxine (SYNTHROID) 125 mcg tablet Take one tablet by mouth daily and recheck a TSH in  2 months (Patient taking differently: Take 100 mcg by mouth daily.)   ? magnesium oxide (MAG-OX) 400 mg (241.3 mg magnesium) tablet Take 400 mg by mouth daily.   ? mometasone (NASONEX) 50 mcg/actuation nasal spray Apply one spray to each nostril as directed twice daily.   ? olopatadine (PATANOL) 0.1 % ophthalmic solution Place one drop into or around eye(s) twice daily as needed for Allergy symptoms.   ? rosuvastatin (CRESTOR) 10 mg tablet Take one tablet by mouth daily.   ? zinc sulfate 220 mg (50 mg elemental zinc) capsule Take 220 mg by mouth daily.     Vitals:    10/30/19 1304   Weight: 96.2 kg (212 lb)   Height: 162.6 cm (64)     Body mass index is 36.39 kg/m?Marland Kitchen         Physical Exam  Constitutional:       General: She is not in acute distress. Eyes:      Comments: No apparent conjunctivitis or eye discharge were able to be appreciated via limited Telehealth video   Pulmonary:      Effort: Pulmonary effort is normal.   Skin:     Comments: No apparent rash on exposed skin areas were able to be appreciated via limited Telehealth video   Neurological:      Mental Status: She is alert.   Psychiatric:         Mood and Affect: Mood normal.         Behavior: Behavior normal.              Assessment and Plan:    Problem   Dyspnea On Exertion    My suspicion that asthma was the etiology for Mrs. Hilton's new onset dyspnea on exertion was low given that she has no h/o asthma, breathing symptoms, or requiring albuterol previously in her lifetime, and in-office spirometry is normal with no further significant reversibility post-bronchodilator, and pulmonary examination is normal with no change post-bronchodilator administration.  Mrs. Larr did not feel any improvement in symptoms with albuterol 2p administered in clinic last in-person visit.  That being said, because she's had a normal cardiac evaluation with Holter monitor & stress echo per her cardiologist, normal CXR per her PCP, and asthma was considered on the differential diagnosis, we tried asthma inhaler with Flovent 2p BID x3 weeks and that has been helpful to relieve the dyspnea on exertion and shortness of breath.  However, unfortunately, since discontinuing Singulair, she's developed a non-productive cough.  She hasn't tried albuterol for this cough yet.    Start Breo 100/57mcg 1p daily.  Will provide sample.  Mrs. Rathore gave verbal permission for her daughter to pick up sample today for her.  Continue albuterol 2 puffs every 4 hours as-needed for wheezing, chest tightness, shortness of breath, or cough.   Contact us the 1st time albuterol doesn't last a full 4 hours or doesn't completely relieve symptoms.   Contact us if requiring albuterol > 2 times per week for rescue of any day time asthma symptoms. Contact us if requiring albuterol > 2 times per month for rescue of any night-time awakenings due to asthma symptoms.     We have previously discussed methods, merits, and potential risks/cost of methacholine challenge and she prefers to leave this on the back burner.     Allergic Rhinoconjunctivitis    12-12-17 total IgE within normal limits   IgE class II or greater to: 2 dust mite species  05-25-18  aeroallergen SPT positive to 2 cockroach species, 2 dust mite species, and 2 molds with appropriate controls  05-25-18 aeroallergen ID positive to 2 trees, 2 grasses, 1 weed, cat, dog, 1 mold with appropriate controls    Continue aeroallergen avoidance measures.  Continue saline sinus rinses daily using either boiled-then-cooled tap water or distilled water to mix with saline packets.  Continue Nasonex 1p en BID  Continue Astelin 1p en BID  Continue Patanol 1 dr Nolon Bussing eye BID PRN itchy/watery eyes  Continue Zyrtec 10mg  daily to BID     Proper nasal inhaler technique was demonstrated to minimize risk of nosebleeds previously.  Potential adverse effects of medications were discussed and all questions were answered to the best of my ability previously.    She'll reach out to her Cardiologist to discuss whether or not they have any objection to her starting a trial of Sudafed 60mg  every 6 hours as-needed?      We reviewed package inserts for zafiralukast and zilueton including dosing, monitoring parameters, and potential adverse effects.  We'll leave these on the back burner as there are post marketing reports of mood changes per package inserts of these medications as well.  She's off Singulair per recommendation of PCP due to concerns it was contributing to mood changes.    She may be a candidate for allergy shots in the future if her Cardiologist has no objection and her symptoms remain suboptimally controlled with medical management.  We'll leave on the back burner for now.         I advised her to seek medical attention if she's not improved in 1 week or gets worse at any time.    Return in about 4 weeks (around 11/27/2019).                      38 minutes spent on this patient's encounter with counseling and coordination of care taking >50% of the visit.      Thank you for allowing [name] to participate in the care of this patient.  Please feel free to contact us if there are any questions or concerns about the patient.    [name], MD  Assistant Professor of Allergy & Clinical Immunology  Department of Internal Medicine   [facility]

## 2019-11-02 ENCOUNTER — Encounter: Admit: 2019-11-02 | Discharge: 2019-11-02

## 2019-11-02 NOTE — Progress Notes
Holter Placement Record  Ordering Physician: Miles Costain  Diagnosis: PVCs  Brand:  Cardionet   Length: 24 hours  Holter Number: [id number]  Card Number:   Holter start: 11/02/19  Location where Holter was placed: Home enrollment  Will Holter be returned by mail? Yes

## 2019-11-09 ENCOUNTER — Encounter: Admit: 2019-11-09 | Discharge: 2019-11-09

## 2019-11-09 MED ORDER — FLECAINIDE 100 MG PO TAB
100 mg | ORAL_TABLET | Freq: Two times a day (BID) | ORAL | 0 refills | 30.00000 days | Status: DC
Start: 2019-11-09 — End: 2019-11-13

## 2019-11-11 ENCOUNTER — Encounter: Admit: 2019-11-11 | Discharge: 2019-11-11

## 2019-11-13 ENCOUNTER — Encounter: Admit: 2019-11-13 | Discharge: 2019-11-13

## 2019-11-13 MED ORDER — FLECAINIDE 100 MG PO TAB
100 mg | ORAL_TABLET | Freq: Two times a day (BID) | ORAL | 3 refills | 30.00000 days | Status: AC
Start: 2019-11-13 — End: ?

## 2019-11-14 ENCOUNTER — Encounter: Admit: 2019-11-14 | Discharge: 2019-11-14

## 2019-11-14 MED ORDER — BREO ELLIPTA 100-25 MCG/DOSE IN DSDV
1 | Freq: Every day | RESPIRATORY_TRACT | 3 refills | Status: DC
Start: 2019-11-14 — End: 2019-11-16

## 2019-11-15 ENCOUNTER — Encounter: Admit: 2019-11-15 | Discharge: 2019-11-15

## 2019-11-16 ENCOUNTER — Encounter: Admit: 2019-11-16 | Discharge: 2019-11-16

## 2019-11-16 MED ORDER — OLOPATADINE 0.1 % OP DROP
1 [drp] | Freq: Two times a day (BID) | OPHTHALMIC | 11 refills | 27.50000 days | Status: AC | PRN
Start: 2019-11-16 — End: ?

## 2019-11-16 MED ORDER — BREO ELLIPTA 100-25 MCG/DOSE IN DSDV
1 | Freq: Every day | RESPIRATORY_TRACT | 3 refills | Status: AC
Start: 2019-11-16 — End: ?

## 2019-11-16 NOTE — Telephone Encounter
Patient is required to fill her prescription through [facility]. Prescription requests were received for her Olopatadine and her Breo. Will send scripts there instead.    LOV was 10/30/19. FU schedule 01/25/20. Per LOV note:    Continue Patanol 1 dr Nolon Bussing eye BID PRN itchy/watery eyes  Start Breo 100/17mcg 1p daily.

## 2019-11-20 ENCOUNTER — Encounter: Admit: 2019-11-20 | Discharge: 2019-11-20

## 2019-11-20 MED ORDER — ESTRADIOL 0.075 MG/24 HR TD PTWK
1 | MEDICATED_PATCH | TRANSDERMAL | 3 refills | 43.00000 days | Status: AC
Start: 2019-11-20 — End: ?

## 2019-12-10 ENCOUNTER — Encounter: Admit: 2019-12-10 | Discharge: 2019-12-10

## 2019-12-29 ENCOUNTER — Encounter: Admit: 2019-12-29 | Discharge: 2019-12-29

## 2019-12-31 ENCOUNTER — Encounter: Admit: 2019-12-31 | Discharge: 2019-12-31

## 2019-12-31 NOTE — Telephone Encounter
Spoke with patient. Moved appointment to 01-01-20 at 3 pm. Patient verbalized understanding

## 2019-12-31 NOTE — Telephone Encounter
Attempted to call patient regarding her appointment on 01-01-20 in our urgent clinic. Appointment is needing to rescheduled due to provider is out of the office. No answer. Left message asking patient to call office. Mychart message sent.

## 2020-01-01 ENCOUNTER — Ambulatory Visit: Admit: 2020-01-01 | Discharge: 2020-01-02 | Payer: BC Managed Care – PPO

## 2020-01-01 ENCOUNTER — Encounter: Admit: 2020-01-01 | Discharge: 2020-01-01

## 2020-01-01 DIAGNOSIS — F329 Major depressive disorder, single episode, unspecified: Secondary | ICD-10-CM

## 2020-01-01 DIAGNOSIS — N958 Other specified menopausal and perimenopausal disorders: Secondary | ICD-10-CM

## 2020-01-01 DIAGNOSIS — R309 Painful micturition, unspecified: Principal | ICD-10-CM

## 2020-01-01 DIAGNOSIS — E7800 Pure hypercholesterolemia, unspecified: Secondary | ICD-10-CM

## 2020-01-01 DIAGNOSIS — G4730 Sleep apnea, unspecified: Secondary | ICD-10-CM

## 2020-01-01 DIAGNOSIS — I499 Cardiac arrhythmia, unspecified: Secondary | ICD-10-CM

## 2020-01-01 DIAGNOSIS — F99 Mental disorder, not otherwise specified: Secondary | ICD-10-CM

## 2020-01-01 DIAGNOSIS — R000 Tachycardia, unspecified: Secondary | ICD-10-CM

## 2020-01-01 DIAGNOSIS — Q615 Medullary cystic kidney: Secondary | ICD-10-CM

## 2020-01-01 DIAGNOSIS — Z8659 Personal history of other mental and behavioral disorders: Secondary | ICD-10-CM

## 2020-01-01 DIAGNOSIS — E039 Hypothyroidism, unspecified: Secondary | ICD-10-CM

## 2020-01-01 DIAGNOSIS — F4310 Post-traumatic stress disorder, unspecified: Secondary | ICD-10-CM

## 2020-01-01 DIAGNOSIS — E785 Hyperlipidemia, unspecified: Secondary | ICD-10-CM

## 2020-01-01 DIAGNOSIS — N390 Urinary tract infection, site not specified: Secondary | ICD-10-CM

## 2020-01-01 MED ORDER — ESTRADIOL 0.01 % (0.1 MG/GRAM) VA CREA
Freq: Every day | VAGINAL | 3 refills | 43.00000 days | Status: AC
Start: 2020-01-01 — End: ?

## 2020-01-01 NOTE — Telephone Encounter
Spoke with patient. Patient asked if she could move her appointment for today 8/24 at 3:00pm to a later time. Advised patient that unfortunately there are no openings in urgent clinic after 3:00pm. Patient verbalized understanding and stated that she would make it to the 3:00pm appointment.

## 2020-01-01 NOTE — Progress Notes
Date of Service: 01/01/2020    Subjective:             Holly Maldonado is a 59 y.o. female.    History of Present Illness       Review of Systems   Constitutional: Positive for activity change, appetite change, fatigue and unexpected weight change. Negative for fever.   HENT: Positive for rhinorrhea and sinus pressure. Negative for voice change.    Respiratory: Positive for cough, shortness of breath and wheezing.    Cardiovascular: Negative for chest pain and leg swelling.   Gastrointestinal: Positive for constipation. Negative for abdominal pain, blood in stool, diarrhea, nausea and vomiting.   Endocrine: Positive for heat intolerance.   Genitourinary: Positive for dysuria and vaginal pain. Negative for difficulty urinating, dyspareunia, enuresis, frequency, genital sores, hematuria, menstrual problem, pelvic pain, urgency, vaginal bleeding and vaginal discharge.   Musculoskeletal: Positive for back pain. Negative for arthralgias.   Skin: Negative for rash.   Neurological: Negative for light-headedness and headaches.   Hematological: Negative for adenopathy. Does not bruise/bleed easily.   Psychiatric/Behavioral: Positive for agitation, self-injury and sleep disturbance. Negative for confusion. The patient is nervous/anxious.    All other systems reviewed and are negative.        Objective:         ? albuterol sulfate (PROAIR HFA) 90 mcg/actuation HFA aerosol inhaler Inhale two puffs by mouth into the lungs every 4 hours as needed for Wheezing or Shortness of Breath. Shake well before use.   ? aspirin EC 81 mg tablet Take 81 mg by mouth daily. Take with food.   ? azelastine (ASTELIN) 137 mcg (0.1 %) nasal spray Apply one spray to two sprays to each nostril as directed twice daily as needed.   ? busPIRone (BUSPAR) 15 mg tablet Take 15 mg by mouth twice daily.   ? cetirizine (ZYRTEC) 5 mg tablet Take one tablet by mouth every morning.   ? Cholecalciferol (Vitamin D3) 1,000 unit cap Take 5,000 Units by mouth.   ? clonazePAM (KLONOPIN) 0.5 mg tablet Take 0.5 mg by mouth twice daily.   ? desvenlafaxine (PRISTIQ) 50 mg tablet Take 100 mg by mouth daily.   ? docosahexaenoic acid/epa (FISH OIL PO) Take 3 capsules by mouth daily.   ? estradioL (CLIMARA) 0.075 mg/24 hr patch Apply one patch to top of skin as directed twice weekly.   ? ezetimibe (ZETIA) 10 mg tablet Take one tablet by mouth daily.   ? flecainide (TAMBOCOR) 100 mg tablet Take one tablet by mouth twice daily.   ? FLOVENT HFA 110 mcg/actuation inhaler INHALE 2 PUFFS BY MOUTH INTO THE LUNGS TWICE DAILY   ? fluticasone-vilanterol(+) (BREO ELLIPTA) 100-25 mcg inhalation disk Inhale one puff by mouth into the lungs daily.   ? levothyroxine (SYNTHROID) 125 mcg tablet Take one tablet by mouth daily and recheck a TSH in 2 months (Patient taking differently: Take 100 mcg by mouth daily.)   ? magnesium oxide (MAG-OX) 400 mg (241.3 mg magnesium) tablet Take 400 mg by mouth daily.   ? mometasone (NASONEX) 50 mcg/actuation nasal spray Apply one spray to each nostril as directed twice daily.   ? olopatadine (PATANOL) 0.1 % ophthalmic solution Place one drop into or around eye(s) twice daily as needed for Allergy symptoms.   ? prazosin (MINIPRESS) 1 mg capsule Take 1 mg by mouth at bedtime daily.   ? rOPINIRole (REQUIP) 0.5 mg tablet Take 0.5 mg by mouth at bedtime daily.   ?  rosuvastatin (CRESTOR) 10 mg tablet Take one tablet by mouth daily.   ? vortioxetine (TRINTELLIX) 10 mg tablet Take 10 mg by mouth daily.   ? zinc sulfate 220 mg (50 mg elemental zinc) capsule Take 220 mg by mouth daily.     Vitals:    01/01/20 1509   Resp: 18   Height: 162.6 cm (64)   PainSc: Zero     Body mass index is 36.39 kg/m?Marland Kitchen     Physical Exam         Assessment and Plan:

## 2020-01-01 NOTE — Progress Notes
Date of Service: 01/01/2020    Holly Maldonado is a 59 y.o. female.  DOB: 1961-03-26  MRN: [id number]       Subjective:            Chief Complaint   Patient presents with   ? Urinary Problem            History of Present Illness     Burning with urination and vaginal burning that started last week. She has tried using AZO and OTC medication for yeast. She spoke with a PA, who told her that her plateau in weight loss and her current symptoms may be due to her use of estrogen patch. She removed the patch on Friday, but states she was so miserable with menopause symptoms by Monday, that she put a new one on.  She has been using the patches since seeing Dr Garlan Fair.  Had increased dose started 11/28/2019 (up to .075mg ), She does think that her hot flashes, night sweats are helped a lot by the increased dose. She has not noted any improvement in vaginal dryness/burning/itching sensations, though. She did try OTC Replense, without much relief.   She endorses current treatment for eating disorder, and she is also actively being treated for Depression. Her medication was just changed to Trintellix from [employer].     Nefertiti Mohamad has a past medical history of Arrhythmia (multiple PVCs from EKG 08/14/12), depression, Dyslipidemia, Endometriosis, H/O eating disorder, Hematuria (microscopic), High cholesterol, Hypothyroid, Medullary sponge kidney, Mental disorder, Nephrocalcinosis, PTSD (post-traumatic stress disorder), Recurrent UTI, Sleep apnea, and Tachycardia.    She has a past surgical history that includes electrocardiogram; sinus surgery; tonsillectomy; bladder suspension; cesarean section; hysterectomy; tubal ligation (2010); Colonoscopy (N/A, 12/15/2018) (COLONOSCOPY DIAGNOSTIC WITH SPECIMEN COLLECTION BY BRUSHING/ WASHING - FLEXIBLE performed by Veneta Penton, MD at [facility] OR); and laparoscopy.    Myles [name]'s family history includes Cancer in her mother; Circulatory problem in her maternal grandmother; Coronary Artery Disease in her father, maternal grandfather, maternal grandmother, paternal grandfather, and paternal grandmother; Diabetes in her mother; Diabetes Type II in her mother; Heart Attack in her maternal grandfather and mother; Heart Surgery in her maternal grandfather.           Review of Systems   Constitutional: Negative for fever.         Objective:         ? albuterol sulfate (PROAIR HFA) 90 mcg/actuation HFA aerosol inhaler Inhale two puffs by mouth into the lungs every 4 hours as needed for Wheezing or Shortness of Breath. Shake well before use.   ? aspirin EC 81 mg tablet Take 81 mg by mouth daily. Take with food.   ? azelastine (ASTELIN) 137 mcg (0.1 %) nasal spray Apply one spray to two sprays to each nostril as directed twice daily as needed.   ? busPIRone (BUSPAR) 15 mg tablet Take 15 mg by mouth twice daily.   ? cetirizine (ZYRTEC) 5 mg tablet Take one tablet by mouth every morning.   ? Cholecalciferol (Vitamin D3) 1,000 unit cap Take 5,000 Units by mouth.   ? clonazePAM (KLONOPIN) 0.5 mg tablet Take 0.5 mg by mouth twice daily.   ? desvenlafaxine (PRISTIQ) 50 mg tablet Take 100 mg by mouth daily.   ? docosahexaenoic acid/epa (FISH OIL PO) Take 3 capsules by mouth daily.   ? estradioL (CLIMARA) 0.075 mg/24 hr patch Apply one patch to top of skin as directed twice weekly.   ? estradioL (ESTRACE)  0.01 % (0.1 mg/g) vaginal cream Insert or Apply one-half g to one g to vaginal area daily for 14 days, THEN one-half g to one g three times weekly for 30 days.   ? ezetimibe (ZETIA) 10 mg tablet Take one tablet by mouth daily.   ? flecainide (TAMBOCOR) 100 mg tablet Take one tablet by mouth twice daily.   ? FLOVENT HFA 110 mcg/actuation inhaler INHALE 2 PUFFS BY MOUTH INTO THE LUNGS TWICE DAILY   ? fluticasone-vilanterol(+) (BREO ELLIPTA) 100-25 mcg inhalation disk Inhale one puff by mouth into the lungs daily.   ? levothyroxine (SYNTHROID) 125 mcg tablet Take one tablet by mouth daily and recheck a TSH in 2 months (Patient taking differently: Take 100 mcg by mouth daily.)   ? magnesium oxide (MAG-OX) 400 mg (241.3 mg magnesium) tablet Take 400 mg by mouth daily.   ? mometasone (NASONEX) 50 mcg/actuation nasal spray Apply one spray to each nostril as directed twice daily.   ? olopatadine (PATANOL) 0.1 % ophthalmic solution Place one drop into or around eye(s) twice daily as needed for Allergy symptoms.   ? prazosin (MINIPRESS) 1 mg capsule Take 1 mg by mouth at bedtime daily.   ? rOPINIRole (REQUIP) 0.5 mg tablet Take 0.5 mg by mouth at bedtime daily.   ? rosuvastatin (CRESTOR) 10 mg tablet Take one tablet by mouth daily.   ? vortioxetine (TRINTELLIX) 10 mg tablet Take 10 mg by mouth daily.   ? zinc sulfate 220 mg (50 mg elemental zinc) capsule Take 220 mg by mouth daily.     Vitals:    01/01/20 1509   BP: 134/81   BP Source: Arm, Left Upper   Patient Position: Sitting   Pulse: 96   Resp: 18   Temp: 36.9 ?C (98.5 ?F)   TempSrc: Oral   Weight: 114.2 kg (251 lb 12.8 oz)   Height: 162.6 cm (64)   PainSc: Zero     Body mass index is 43.22 kg/m?Marland Kitchen     Physical Exam  Vitals and nursing note reviewed. Exam conducted with a chaperone present.   Constitutional:       General: She is not in acute distress.     Appearance: Normal appearance. She is well-developed. She is not ill-appearing, toxic-appearing or diaphoretic.   HENT:      Head: Normocephalic and atraumatic.   Eyes:      Pupils: Pupils are equal, round, and reactive to light.   Cardiovascular:      Rate and Rhythm: Normal rate.   Pulmonary:      Effort: Pulmonary effort is normal.   Abdominal:      Palpations: Abdomen is soft.      Tenderness: There is no abdominal tenderness.   Genitourinary:     Labia:         Right: No rash, tenderness, lesion or injury.         Left: No rash, tenderness, lesion or injury.       Vagina: No foreign body. No vaginal discharge, erythema, tenderness, bleeding or lesions.      Cervix: Discharge present. No cervical motion tenderness, friability, lesion, erythema or cervical bleeding.      Uterus: Not tender.       Adnexa:         Right: No tenderness.          Left: No tenderness.        Comments: No obvious lesions noted. Slightly pale appearance to labia minora,  right >left.  Musculoskeletal:         General: Normal range of motion.      Cervical back: Normal range of motion and neck supple.   Lymphadenopathy:      Lower Body: No right inguinal adenopathy. No left inguinal adenopathy.   Skin:     General: Skin is warm.   Neurological:      Mental Status: She is alert and oriented to person, place, and time.   Psychiatric:         Mood and Affect: Mood normal.         Speech: Speech normal.         Behavior: Behavior normal.         Thought Content: Thought content normal.         Judgment: Judgment normal.              Assessment and Plan:       Discussed briefly with Dr Garlan Fair.   Discussed with pt: estrogen not likely to be cause for any weight gain. While it will not make her lose weight, it also should not prevent it. With slightly atrophic appearance to labia, will trial topical estrogen. Pt will continue to work with mental health professionals regarding depression. Encouraged her to follow-up in clinic if it is not helping.          Amandine A. Hogans was seen today for urinary problem.    Diagnoses and all orders for this visit:    Genitourinary syndrome of menopause    Pain with urination  -     CULTURE-URINE W/SENSITIVITY; Future; Expected date: 01/01/2020  -     URINALYSIS, MICROSCOPIC; Future; Expected date: 01/01/2020  -     URINALYSIS DIPSTICK; Future; Expected date: 01/01/2020  -     URINE CLEAR TOP TUBE    Other orders  -     estradioL (ESTRACE) 0.01 % (0.1 mg/g) vaginal cream; Insert or Apply one-half g to one g to vaginal area daily for 14 days, THEN one-half g to one g three times weekly for 30 days.               Total Time Today was 30 minutes in the following activities: Preparing to see the patient, Obtaining and/or reviewing separately obtained history, Performing a medically appropriate examination and/or evaluation, Counseling and educating the patient/family/caregiver, Ordering medications, tests, or procedures, Referring and communication with other health care professionals (when not separately reported) and Documenting clinical information in the electronic or other health record      Tanaia Hawkey Melany Guernsey, APRN  Dr. Tami Ribas, collaborator      Patient Instructions   We will contact you with results of testing, when it is available. We will send any medications that are needed based on results.     Return to clinic in 2-4 weeks if still having symptoms    Return earlier with any concerns.       General Instructions  For prompt and efficient communication, MyChart is preferred. Please sign up.  [email or web address]      To Contact Me:   Please send a MyChart message to your doctor or call [phone] to reach your doctor's nurse team. Please only call once in a 24-hour period. Leave a voicemail for the nurse to respond.  Calls or messages received after 4pm will be returned the next business day.  To Have Medication Refilled:   Please use the MyChart Refill request or  contact your pharmacy directly to request medication refills. Please allow 72 hours.  To Receive Your Test Results:  ? Please allow 2 business day for labs to result in MyChart.  ? Please allow 4 business days for other tests, including cardiac studies, blood bank, and radiology results.  ? It can take up to 10 days for pathology  Our Lab (Location, Hours, and Fasting Information)  Lab Location: the 1st floor of the Medical Office Building, directly to the left of the Information Desk.  Lab Hours: Monday 6:30 am-6 pm, Tuesday-Friday 7 am-6 pm, and Saturday 7 am-noon.   Fasting for Lab: For fasting labs, please:   Do not eat for at least 8-10 hours before having your labs drawn, drink plenty of water, and make sure to continue your medications as prescribed (unless otherwise specified).  Radiology is on the 2nd floor of the Medical Office Building. Please call [phone], option #1; Mammogram at [facility] location, please call [phone], option #1.  To Schedule an Appointment:   Contact our schedulers at [phone] and select #1 to make an appointment. At this time, you will be encouraged to signed up to MyChart if you not active.     To Receive Appointment Reminders on Your Cell Phone:   Make sure we have your cell phone number, and text [place] to [phone].  For Urgent Questions:   For medical emergencies, call 911.  On nights, weekends or holidays, call the Hospital operator at [phone], and ask for the ?Ob/Gyn Physician on call or Certified Nurse Midwife on call.?        We value your feedback and you may receive a survey about your experience with our office. If you had a favorable experience, the only positive survey results that we will receive are if we are rated in the 9 or 10 range out of 10 points. Please let us know why we did not earn 9-10 rating.  Thank you.

## 2020-01-02 LAB — URINALYSIS DIPSTICK
Lab: 1 (ref 1.003–1.035)
Lab: 6 (ref 5.0–8.0)
Lab: NEGATIVE
Lab: NEGATIVE
Lab: NEGATIVE
Lab: NEGATIVE
Lab: NEGATIVE
Lab: NEGATIVE
Lab: NEGATIVE
Lab: NEGATIVE

## 2020-01-02 LAB — URINALYSIS, MICROSCOPIC

## 2020-01-04 ENCOUNTER — Encounter: Admit: 2020-01-04 | Discharge: 2020-01-04

## 2020-01-04 MED ORDER — BREO ELLIPTA 100-25 MCG/DOSE IN DSDV
Freq: Every day | 3 refills
Start: 2020-01-04 — End: ?

## 2020-01-25 ENCOUNTER — Encounter: Admit: 2020-01-25 | Discharge: 2020-01-25

## 2020-01-25 ENCOUNTER — Ambulatory Visit: Admit: 2020-01-25 | Discharge: 2020-01-26 | Payer: BC Managed Care – PPO

## 2020-01-25 DIAGNOSIS — E039 Hypothyroidism, unspecified: Secondary | ICD-10-CM

## 2020-01-25 DIAGNOSIS — E785 Hyperlipidemia, unspecified: Secondary | ICD-10-CM

## 2020-01-25 DIAGNOSIS — R05 Cough: Secondary | ICD-10-CM

## 2020-01-25 DIAGNOSIS — I499 Cardiac arrhythmia, unspecified: Secondary | ICD-10-CM

## 2020-01-25 DIAGNOSIS — G4730 Sleep apnea, unspecified: Secondary | ICD-10-CM

## 2020-01-25 DIAGNOSIS — R000 Tachycardia, unspecified: Secondary | ICD-10-CM

## 2020-01-25 DIAGNOSIS — F4310 Post-traumatic stress disorder, unspecified: Secondary | ICD-10-CM

## 2020-01-25 DIAGNOSIS — R1310 Dysphagia, unspecified: Secondary | ICD-10-CM

## 2020-01-25 DIAGNOSIS — F329 Major depressive disorder, single episode, unspecified: Secondary | ICD-10-CM

## 2020-01-25 DIAGNOSIS — N390 Urinary tract infection, site not specified: Secondary | ICD-10-CM

## 2020-01-25 DIAGNOSIS — Q615 Medullary cystic kidney: Secondary | ICD-10-CM

## 2020-01-25 DIAGNOSIS — F99 Mental disorder, not otherwise specified: Secondary | ICD-10-CM

## 2020-01-25 DIAGNOSIS — E7800 Pure hypercholesterolemia, unspecified: Secondary | ICD-10-CM

## 2020-01-25 DIAGNOSIS — Z8659 Personal history of other mental and behavioral disorders: Secondary | ICD-10-CM

## 2020-01-25 NOTE — Patient Instructions
Please get the breathing test done in the [facility] laboratory.  You may call [phone] to schedule this.

## 2020-01-26 DIAGNOSIS — R0600 Dyspnea, unspecified: Principal | ICD-10-CM

## 2020-01-28 ENCOUNTER — Encounter: Admit: 2020-01-28 | Discharge: 2020-01-28

## 2020-01-30 ENCOUNTER — Encounter: Admit: 2020-01-30 | Discharge: 2020-01-30

## 2020-02-07 ENCOUNTER — Encounter: Admit: 2020-02-07 | Discharge: 2020-02-07

## 2020-02-14 ENCOUNTER — Ambulatory Visit: Admit: 2020-02-14 | Discharge: 2020-02-14 | Payer: BC Managed Care – PPO

## 2020-02-14 ENCOUNTER — Encounter: Admit: 2020-02-14 | Discharge: 2020-02-14

## 2020-02-14 DIAGNOSIS — R0600 Dyspnea, unspecified: Secondary | ICD-10-CM

## 2020-02-14 DIAGNOSIS — R059 Cough: Secondary | ICD-10-CM

## 2020-02-20 ENCOUNTER — Encounter: Admit: 2020-02-20 | Discharge: 2020-02-20

## 2020-02-20 NOTE — Telephone Encounter
I have faxed over the requested records.

## 2020-02-20 NOTE — Telephone Encounter
Please request chest xray and CT chest results from PCP, thank you.

## 2020-02-26 ENCOUNTER — Encounter: Admit: 2020-02-26 | Discharge: 2020-02-26

## 2020-02-26 NOTE — Telephone Encounter
I got a call from [employer]  We discussed her case together and then I called and talked with Holly Maldonado says that Holly Maldonado is having a lot of nausea and he cannot find a cause.  It is important to note that there is a long history of eating disorder.    When I talked with Holly Maldonado she actually agreed that she was having a lot of nausea but she indicated that she thought she had been on flecainide quite a while without any side effects and she was thinking that the nausea is probably not drug related.    Nevertheless, we agreed to the following plan:    1.  Reduce flecainide to 50 twice daily.  Call in 6-7 days.            If she is feeling better we will continue flecainide at that dose and send out a 48-hour Holter monitor.                If she tolerates 50 twice daily and we have good suppression (baseline PVC 24.5%) we will continue on 50 twice daily.            If she is not feeling better we will stop the flecainide.                 If she is feeling well we will do a Holter to assess her PVCs.                 If she is not doing well, i.e. continued nausea, but I would say it is not the flecainide and we would resume it.    2.  We can consider a different antiarrhythmic drug such as sotalol       We can consider PVC ablation    
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
## 2020-03-02 NOTE — Patient Instructions
It was nice to see you in clinic today.  If questions arise, you can reach the clinic by calling [phone].    Actions from today's visit:    Continue all medications        Dr. Lajean Saver    Please direct medication refill requests to your pharmacy.    In the event of a safety concern or suicidal thoughts, call 911 or go to the nearest emergency room. National Suicide Prevention Lifeline (973)238-1599 (Talk). Crisis Text Hotline (text 7151413464).     The [facility] phone line is a resource for talk support in non-emergency situations (1-866-WARMEAR).    Recognizing Suicide Warning Signs in Yourself     People who are thinking about suicide may not know they are depressed. Certain thoughts, feelings, and actions can be signals that let you know you may need help. The best thing you can do is watch for signs that you may be at risk. Then, ask for help. You can talk with your regular healthcare provider or get help from a mental health provider.   Depression  Depression is a treatable illness, just like diabetes or heart disease. And like those illnesses, depression is not something that you can just snap out of. To feel better, treatment is needed before depression gets to a point that it can endanger your life. To know if depression is causing you to feel like ending your life, ask yourself:   ? Do I feel worthless, guilty, helpless, or hopeless?  ? Have I been feeling sad, down, or blue on most days?  ? Have I lost interest in my work or people I used to enjoy?  ? Do I have trouble sleeping or do I sleep too much?  ? Do I eat more or less than normal?  ? Do I feel tired, weak, and low on energy?  ? Do I feel restless and unable to sit still?  ? Do I have trouble thinking or making choices?  ? Do I cry more than normal?  ? Do I feel life isn't worth living?  Warning signs for suicide  Call your healthcare provider or get help right away if you have any of the warning signs below. You can also call a mental health clinic or a 24-hour suicide crisis hotline for help and support. Warning signs for suicide include:   ? Thinking often about taking your life  ? Planning how you may attempt it  ? Talking or writing about committing suicide  ? Feeling that death is the only solution to your problems  ? Feeling a pressing need to make out your will or arrange your funeral  ? Giving away things you own  ? Taking part in risky behaviors, such as having sex with someone you don't know, or drinking and driving  ? Buying a lethal weapon, such as a gun, or hoarding medicines that could be used in an overdose  Call 911  If you are in immediate risk of harming yourself, call 911  To learn more  For more information about depression and suicide prevention:   ? [employer] of Mental Health 6310115951  ? National Suicide Prevention Lifeline (763) 530-0579 (800-273-TALK) www.suicidepreventionlifeline.org  ? [employer] on Mental Illness 800-950-6264www.nami.org  ? Mental Health America800-969-6642www.nmha.org  ? National Suicide Hotline800-503-138-6646 (800-SUICIDE)  StayWell last reviewed this educational content on 09/08/2018  ? 2000-2021 [employer], [place]. All rights reserved. This information is not intended as a substitute for professional medical care. Always  follow your healthcare professional's instructions.        Local Warm Line (not for crisis):  [facility] WarmliNe            phone: (250)019-7541 or 8014861098 (toll free)  [facility]                         4:00pm - 10:00pm every evening    Local Crisis Lines:      Name: [place] Crisis Line 24/7                phone:    8503129542      Name: [facility] Mental Health 24/7                phone:    [phone]      Name: [facility] Mental Health Crisis Line 24/7            phone:    [phone]    National Suicide Prevention Lifelines & Textlines:      Dial (925)688-6682 or 1-800-SUICIDE 807-836-8351) Espa?ol: 860-271-6078     or chat: DealerSpiff.com.pt     or text:  National Crisis Text Line - Text HOME to 210-736-3544     or text:  [facility] [place] - Text HOPE to 20121    Local emergency services:       [facility] - Emergency Department            [address]      [place], [place] [address]                                 phone: [phone]    Or call 911

## 2020-03-03 ENCOUNTER — Encounter: Admit: 2020-03-03 | Discharge: 2020-03-03

## 2020-03-03 ENCOUNTER — Ambulatory Visit: Admit: 2020-03-03 | Discharge: 2020-03-04 | Payer: BC Managed Care – PPO

## 2020-03-03 DIAGNOSIS — I493 Ventricular premature depolarization: Secondary | ICD-10-CM

## 2020-03-03 DIAGNOSIS — F331 Major depressive disorder, recurrent, moderate: Secondary | ICD-10-CM

## 2020-03-03 DIAGNOSIS — F502 Bulimia nervosa: Secondary | ICD-10-CM

## 2020-03-03 DIAGNOSIS — F4312 Post-traumatic stress disorder, chronic: Secondary | ICD-10-CM

## 2020-03-05 ENCOUNTER — Encounter: Admit: 2020-03-05 | Discharge: 2020-03-05

## 2020-03-05 NOTE — Progress Notes
Brief summary of outside records below, full records to be scanned into EMR:    From Steva Ready, MD    10/03/2019 chest CT with contrast impression there is no significant CT abnormality of the chest.  There is no evidence of pulmonary emboli.  There is no evidence of pneumonia.    08/31/2019 x-ray chest 2 view impression no acute cardiopulmonary findings

## 2020-03-07 ENCOUNTER — Encounter: Admit: 2020-03-07 | Discharge: 2020-03-07

## 2020-03-07 NOTE — Progress Notes
Holter Placement Record  Brand: Bio-Tel(CardioNet)  Ordering Physician: LDB  Diagnosis: PVC'S  Length: 72  Location where Holter was placed: Home Enrollment  Will Holter be returned by mail? Yes     Home enrollment

## 2020-03-17 ENCOUNTER — Encounter: Admit: 2020-03-17 | Discharge: 2020-03-17

## 2020-03-17 ENCOUNTER — Ambulatory Visit: Admit: 2020-03-17 | Discharge: 2020-03-17 | Payer: BC Managed Care – PPO

## 2020-03-17 DIAGNOSIS — Z8659 Personal history of other mental and behavioral disorders: Secondary | ICD-10-CM

## 2020-03-17 DIAGNOSIS — E039 Hypothyroidism, unspecified: Secondary | ICD-10-CM

## 2020-03-17 DIAGNOSIS — F99 Mental disorder, not otherwise specified: Secondary | ICD-10-CM

## 2020-03-17 DIAGNOSIS — I499 Cardiac arrhythmia, unspecified: Secondary | ICD-10-CM

## 2020-03-17 DIAGNOSIS — N390 Urinary tract infection, site not specified: Secondary | ICD-10-CM

## 2020-03-17 DIAGNOSIS — E785 Hyperlipidemia, unspecified: Secondary | ICD-10-CM

## 2020-03-17 DIAGNOSIS — R000 Tachycardia, unspecified: Secondary | ICD-10-CM

## 2020-03-17 DIAGNOSIS — M26621 Arthralgia of right temporomandibular joint: Secondary | ICD-10-CM

## 2020-03-17 DIAGNOSIS — J312 Chronic pharyngitis: Secondary | ICD-10-CM

## 2020-03-17 DIAGNOSIS — Q615 Medullary cystic kidney: Secondary | ICD-10-CM

## 2020-03-17 DIAGNOSIS — E7800 Pure hypercholesterolemia, unspecified: Secondary | ICD-10-CM

## 2020-03-17 DIAGNOSIS — G4733 Obstructive sleep apnea (adult) (pediatric): Secondary | ICD-10-CM

## 2020-03-17 DIAGNOSIS — G4730 Sleep apnea, unspecified: Secondary | ICD-10-CM

## 2020-03-17 DIAGNOSIS — F32A Depressed: Secondary | ICD-10-CM

## 2020-03-17 DIAGNOSIS — F4310 Post-traumatic stress disorder, unspecified: Secondary | ICD-10-CM

## 2020-03-17 MED ORDER — IPRATROPIUM BROMIDE 21 MCG (0.03 %) NA SPRY
2 | Freq: Three times a day (TID) | NASAL | 11 refills | 30.00000 days | Status: AC | PRN
Start: 2020-03-17 — End: ?

## 2020-03-17 NOTE — Progress Notes
Date of Service: 03/17/2020    Subjective:             Holly Maldonado is a 59 y.o. female.    History of Present Illness    Holly Maldonado was referred today by Dr. Steva Ready for ear, sinus and throat concerns.  She seems down/upset today to about her sore throat, ear pain and sinus drainage.  She had been working with her allergisty, Dr. Link Snuffer, but had to quit almost every medication due to heart side effects.  When seen by this author in December 2019 (for OSA and Inspire eval) she was struggling with CPAP and considered Inspire, but didn't have DISE and now reports that she wears her CPAP about 7 hours every night.  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  Her sore throat is worse in the morning and has been present for about 3-4 months.  No classic GERD symptoms.  Rare purulent rhinorrhea or need for recurrent sinus infections.  Recent dental visit resulted in abx for tooth ache but wonders if the problem is her ear on the right side.    She was diagnosed with COVID 19 in September, but thorough pulmonary work up since that time has been negative for her cough and shortness of breath.    She had prior sinus/turbinate surgery in the 1990s.    Medical History:   Diagnosis Date   ? Arrhythmia     multiple PVCs from EKG 08/14/12   ? depression    ? Dyslipidemia    ? Endometriosis    ? H/O eating disorder    ? Hematuria     microscopic   ? High cholesterol    ? Hypothyroid    ? Medullary sponge kidney    ? Mental disorder    ? Nephrocalcinosis    ? PTSD (post-traumatic stress disorder)    ? Recurrent UTI    ? Sleep apnea    ? Tachycardia      Surgical History:   Procedure Laterality Date   ? HX TUBAL LIGATION  2010   ? COLONOSCOPY DIAGNOSTIC WITH SPECIMEN COLLECTION BY BRUSHING/ WASHING - FLEXIBLE N/A 12/15/2018    Performed by Veneta Penton, MD at [facility] OR   ? ELECTROCARDIOGRAM     ? HX BLADDER SUSPENSION     ? HX CESAREAN SECTION     ? HX HYSTERECTOMY     ? HX SINUS SURGERY     ? HX TONSILLECTOMY     ? LAPAROSCOPY       Family History   Problem Relation Age of Onset   ? Coronary Artery Disease Father    ? Diabetes Type II Mother    ? Cancer Mother         leukemia   ? Diabetes Mother    ? Heart Attack Mother    ? Coronary Artery Disease Maternal Grandmother    ? Circulatory problem Maternal Grandmother    ? Coronary Artery Disease Maternal Grandfather    ? Heart Attack Maternal Grandfather    ? Heart Surgery Maternal Grandfather    ? Coronary Artery Disease Paternal Grandmother    ? Coronary Artery Disease Paternal Grandfather      Social History     Tobacco Use   Smoking Status Former Smoker   ? Packs/day: 1.00   ? Types: Cigarettes   ? Quit date: 08/22/1992   ? Years  since quitting: 27.5   Smokeless Tobacco Never Used     Social History     Substance and Sexual Activity   Drug Use No       PMH, SH, FH, allergies and medications above have been reviewed.       Review of Systems   Constitutional: Negative.    HENT: Positive for congestion, ear pain, hearing loss, postnasal drip, rhinorrhea, sinus pressure, sinus pain, sneezing, sore throat, tinnitus and trouble swallowing.    Eyes: Negative.    Respiratory: Positive for cough and shortness of breath.    Cardiovascular: Negative.    Gastrointestinal: Negative.    Endocrine: Negative.    Genitourinary: Negative.    Musculoskeletal: Negative.    Skin: Negative.    Allergic/Immunologic: Negative.    Neurological: Positive for dizziness.   Hematological: Negative.    Psychiatric/Behavioral: Negative.          Objective:         ? albuterol sulfate (PROAIR HFA) 90 mcg/actuation HFA aerosol inhaler Inhale two puffs by mouth into the lungs every 4 hours as needed for Wheezing or Shortness of Breath. Shake well before use.   ? aspirin EC 81 mg tablet Take 81 mg by mouth daily. Take with food.   ? azelastine (ASTELIN) 137 mcg (0.1 %) nasal spray Apply one spray to two sprays to each nostril as directed twice daily as needed.   ? busPIRone (BUSPAR) 15 mg tablet Take 15 mg by mouth twice daily.   ? cetirizine (ZYRTEC) 5 mg tablet Take one tablet by mouth every morning.   ? Cholecalciferol (Vitamin D3) 1,000 unit cap Take 5,000 Units by mouth.   ? clonazePAM (KLONOPIN) 0.5 mg tablet Take 0.5 mg by mouth twice daily.   ? desvenlafaxine (PRISTIQ) 50 mg tablet Take 100 mg by mouth daily.   ? docosahexaenoic acid/epa (FISH OIL PO) Take 3 capsules by mouth daily.   ? estradioL (CLIMARA) 0.075 mg/24 hr patch Apply one patch to top of skin as directed twice weekly.   ? ezetimibe (ZETIA) 10 mg tablet Take one tablet by mouth daily.   ? flecainide (TAMBOCOR) 100 mg tablet Take one tablet by mouth twice daily.   ? FLOVENT HFA 110 mcg/actuation inhaler INHALE 2 PUFFS BY MOUTH INTO THE LUNGS TWICE DAILY   ? fluticasone furoate-vilanterol (BREO ELLIPTA) 100-25 mcg inhalation disk Inhale one puff by mouth into the lungs daily.   ? levothyroxine (SYNTHROID) 125 mcg tablet Take one tablet by mouth daily and recheck a TSH in 2 months (Patient taking differently: Take 100 mcg by mouth daily.)   ? magnesium oxide (MAG-OX) 400 mg (241.3 mg magnesium) tablet Take 400 mg by mouth daily.   ? mometasone (NASONEX) 50 mcg/actuation nasal spray Apply one spray to each nostril as directed twice daily.   ? olopatadine (PATANOL) 0.1 % ophthalmic solution Place one drop into or around eye(s) twice daily as needed for Allergy symptoms.   ? prazosin (MINIPRESS) 1 mg capsule Take 1 mg by mouth at bedtime daily.   ? rOPINIRole (REQUIP) 0.5 mg tablet Take 0.5 mg by mouth at bedtime daily.   ? rosuvastatin (CRESTOR) 10 mg tablet Take one tablet by mouth daily.   ? vortioxetine (TRINTELLIX) 10 mg tablet Take 10 mg by mouth daily.   ? zinc sulfate 220 mg (50 mg elemental zinc) capsule Take 220 mg by mouth daily.     Vitals:    03/17/20 1111   BP: 113/77  Pulse: 66   Weight: 90.3 kg (199 lb)   Height: 162.6 cm (64.02)   PainSc: Four     Body mass index is 34.14 kg/m?Marland Kitchen     Physical Exam  General: Well-developed, well-nourished   Communication and Voice: Clear pitch and clarity   Hearing: Hearing adequate for verbal communication bilaterally   Inspection: Normocephalic and atraumatic without mass or lesion   Palpation: Facial skeleton intact without bony stepoffs.    Facial Strength: Facial motility symmetric and full bilaterally   Pinna: External ear intact and fully developed   External canal: Canal is patent with intact skin   Tympanic Membrane: Normal bilaterally   External nose: No scar or anatomic deformity   Internal Nose: Septum intact.  MMM.  Turbinates 2+  Oral cavity, Lips, Teeth, and Gums: Mucosa and teeth intact and viable, No lesions, masses or ulcers   Oropharynx: No erythema or exudate, no masses or ulcerations, no asymmetry   Larynx: Normal voice, no stridor or stertor.   Neck, Trachea, Lymphatics: Midline trachea without mass or lesion, no lymphadenopathy.  R>L TMJ ttp with crepitance.   Thyroid: No mass or nodularity   Eyes: No nystagmus with equal extraocular motion bilaterally   Neuro/Psych/Balance: Patient oriented and appropriate in interaction; Appropriate mood and affect; Gait is intact with no imbalance; Cranial nerves I-XII are intact   Respiratory effort: Equal inspiration and expiration, no respiratory distress   Peripheral Vascular: Warm extremities with equal distal pulses    Procedure:  Secondary to hyperactive gag reflex and chronic throat symptoms, flexible fiberoptic laryngoscopy was performed.  After obtaining verbal informed consent, I sprayed neosynephrine and 4% lidocaine in the bilateral nasal cavity.  After adequate time for decongestion and anesthesia a flexible nasopharyngolaryngoscope was inserted.  The nasal cavity was clear.  Surgical changes consistent with middle turbinate and maxillary/ethmoid sinus surgery (NO PUS or POLYP).  The nasopharynx showed no mass and normal soft palate mobility.  The base of tongue, hypopharynx, supraglottis, glottis, and visualized subglottis were clear.  Vocal cord motion was normal.  ABNORMAL FINDINGS: Interarytenoid pachydermia and post cricoid edema (mild).    Differential Diagnosis:  Tonsillitis, pharyngitis, reflux esophagitis, lingual tonsillitis/epiglottitis, nasopharyngitis, benign or malignant neoplasm       Assessment and Plan:  1. OSA on CPAP     2. Arthralgia of right temporomandibular joint     3. Chronic sore throat       CPAP or BiPap for OSA.  Patient educated about the importance of treating OSA to prevent stroke or TIA, cardiac arythemia (atrial fibrillation), insulin resistance, hypertension, and MVA.  The patient agrees not to drive when feeling tired or overly fatigued.  If they are not tolerating the CPAP 6 hours per night for at least 5 nights per week, they will return to discuss surgical treatment options vs alternative medical measures.   F/u with PCP or sleep provider to document disease control moving forward.    LPRD handout provided for possible LPRD component, but prefer dietary and OTC medication control over long term aggressive acid suppression.    NSAIDS with food TID x 10 days   Soft diet x 10 days.   Warm packs as needed.  Resume custom TMJ tooth guard qhs to prevent bruxism    Consider Referral: Lorrin Mais or DDS with TMJ expertise     Thanks for allowing me to participate in the care of this pleasant patient.

## 2020-04-10 ENCOUNTER — Encounter: Admit: 2020-04-10 | Discharge: 2020-04-10

## 2020-04-29 ENCOUNTER — Encounter: Admit: 2020-04-29 | Discharge: 2020-04-29

## 2020-04-29 MED ORDER — EZETIMIBE 10 MG PO TAB
ORAL_TABLET | Freq: Every day | 2 refills
Start: 2020-04-29 — End: ?

## 2020-04-30 ENCOUNTER — Ambulatory Visit: Admit: 2020-04-30 | Discharge: 2020-05-01 | Payer: BC Managed Care – PPO

## 2020-04-30 ENCOUNTER — Encounter: Admit: 2020-04-30 | Discharge: 2020-04-30

## 2020-04-30 ENCOUNTER — Ambulatory Visit: Admit: 2020-04-30 | Discharge: 2020-04-30 | Payer: BC Managed Care – PPO

## 2020-04-30 DIAGNOSIS — R748 Abnormal levels of other serum enzymes: Secondary | ICD-10-CM

## 2020-04-30 DIAGNOSIS — E039 Hypothyroidism, unspecified: Secondary | ICD-10-CM

## 2020-04-30 DIAGNOSIS — N390 Urinary tract infection, site not specified: Secondary | ICD-10-CM

## 2020-04-30 DIAGNOSIS — E785 Hyperlipidemia, unspecified: Secondary | ICD-10-CM

## 2020-04-30 DIAGNOSIS — I499 Cardiac arrhythmia, unspecified: Secondary | ICD-10-CM

## 2020-04-30 DIAGNOSIS — F99 Mental disorder, not otherwise specified: Secondary | ICD-10-CM

## 2020-04-30 DIAGNOSIS — F32A Depressed: Secondary | ICD-10-CM

## 2020-04-30 DIAGNOSIS — E7800 Pure hypercholesterolemia, unspecified: Secondary | ICD-10-CM

## 2020-04-30 DIAGNOSIS — R7989 Other specified abnormal findings of blood chemistry: Principal | ICD-10-CM

## 2020-04-30 DIAGNOSIS — N951 Menopausal and female climacteric states: Secondary | ICD-10-CM

## 2020-04-30 DIAGNOSIS — Z8659 Personal history of other mental and behavioral disorders: Secondary | ICD-10-CM

## 2020-04-30 DIAGNOSIS — R000 Tachycardia, unspecified: Secondary | ICD-10-CM

## 2020-04-30 DIAGNOSIS — G4730 Sleep apnea, unspecified: Secondary | ICD-10-CM

## 2020-04-30 DIAGNOSIS — N958 Other specified menopausal and perimenopausal disorders: Secondary | ICD-10-CM

## 2020-04-30 DIAGNOSIS — Q615 Medullary cystic kidney: Secondary | ICD-10-CM

## 2020-04-30 DIAGNOSIS — R232 Flushing: Secondary | ICD-10-CM

## 2020-04-30 DIAGNOSIS — F4310 Post-traumatic stress disorder, unspecified: Secondary | ICD-10-CM

## 2020-04-30 LAB — LIVER FUNCTION PANEL
Lab: 0.4 mg/dL (ref 0.3–1.2)
Lab: 13 U/L (ref 7–56)
Lab: 19 U/L (ref 7–40)
Lab: 4.4 g/dL (ref 3.5–5.0)
Lab: 6.5 g/dL (ref 6.0–8.0)
Lab: 99 U/L (ref 25–110)

## 2020-04-30 LAB — FREE T4-FREE THYROXINE: Lab: 1.8 ng/dL — ABNORMAL HIGH (ref 0.6–1.6)

## 2020-04-30 LAB — TSH WITH FREE T4 REFLEX: Lab: 0 uU/mL — ABNORMAL LOW (ref 0.35–5.00)

## 2020-04-30 NOTE — Progress Notes
Date of Service: 04/30/2020    Subjective:             Holly Maldonado is a 59 y.o. female presenting for follow up of vaginal irritation.     History of Present Illness  She was seen in August and her dose of hormone therapy was increased and she was started on estrace vaginal cream. She reports improvement in her symptoms although not complete resolution. She is using the estrace about 3 times weekly. Her most bothersome symptom is her generally feeling hot. Not sure if this is distinct from hot flashes.        ROS: negative other than what is stated in HPI    Medical History:   Diagnosis Date   ? Arrhythmia     multiple PVCs from EKG 08/14/12   ? depression    ? Dyslipidemia    ? Endometriosis    ? H/O eating disorder    ? Hematuria     microscopic   ? High cholesterol    ? Hypothyroid    ? Medullary sponge kidney    ? Mental disorder    ? Nephrocalcinosis    ? PTSD (post-traumatic stress disorder)    ? Recurrent UTI    ? Sleep apnea    ? Tachycardia      Surgical History:   Procedure Laterality Date   ? HX TUBAL LIGATION  2010   ? COLONOSCOPY DIAGNOSTIC WITH SPECIMEN COLLECTION BY BRUSHING/ WASHING - FLEXIBLE N/A 12/15/2018    Performed by Veneta Penton, MD at [facility] OR   ? ELECTROCARDIOGRAM     ? HX BLADDER SUSPENSION     ? HX CESAREAN SECTION     ? HX HYSTERECTOMY     ? HX SINUS SURGERY     ? HX TONSILLECTOMY     ? LAPAROSCOPY       Social History     Tobacco Use   ? Smoking status: Former Smoker     Packs/day: 1.00     Types: Cigarettes     Quit date: 08/22/1992     Years since quitting: 27.7   ? Smokeless tobacco: Never Used   Substance Use Topics   ? Alcohol use: No   ? Drug use: No     Family History   Problem Relation Age of Onset   ? Coronary Artery Disease Father    ? Diabetes Type II Mother    ? Cancer Mother         leukemia   ? Diabetes Mother    ? Heart Attack Mother    ? Coronary Artery Disease Maternal Grandmother    ? Circulatory problem Maternal Grandmother    ? Coronary Artery Disease Maternal Grandfather    ? Heart Attack Maternal Grandfather    ? Heart Surgery Maternal Grandfather    ? Coronary Artery Disease Paternal Grandmother    ? Coronary Artery Disease Paternal Grandfather      Objective:         ? aspirin EC 81 mg tablet Take 81 mg by mouth daily. Take with food.   ? Cholecalciferol (Vitamin D3) 1,000 unit cap Take 5,000 Units by mouth.   ? clonazePAM (KLONOPIN) 0.5 mg tablet Take 0.5 mg by mouth twice daily.   ? docosahexaenoic acid/epa (FISH OIL PO) Take 3 capsules by mouth daily.   ? estradioL (CLIMARA) 0.075 mg/24 hr patch Apply one patch to top of skin as directed twice weekly.   ?  ezetimibe (ZETIA) 10 mg tablet Take one tablet by mouth daily.   ? flecainide (TAMBOCOR) 100 mg tablet Take one tablet by mouth twice daily.   ? ipratropium bromide (ATROVENT) 21 mcg (0.03 %) nasal spray Apply two sprays to each nostril as directed three times daily as needed.   ? levothyroxine (SYNTHROID) 125 mcg tablet Take one tablet by mouth daily and recheck a TSH in 2 months (Patient taking differently: Take 100 mcg by mouth daily.)   ? magnesium oxide (MAG-OX) 400 mg (241.3 mg magnesium) tablet Take 400 mg by mouth daily.   ? olopatadine (PATANOL) 0.1 % ophthalmic solution Place one drop into or around eye(s) twice daily as needed for Allergy symptoms.   ? rOPINIRole (REQUIP) 0.5 mg tablet Take 0.5 mg by mouth at bedtime daily.   ? rosuvastatin (CRESTOR) 10 mg tablet Take one tablet by mouth daily.   ? zinc sulfate 220 mg (50 mg elemental zinc) capsule Take 220 mg by mouth daily.     Vitals:    04/30/20 1312   Height: 162.6 cm (64)   PainSc: Zero     Body mass index is 34.16 kg/m?Marland Kitchen     Physical Exam  Exam conducted with a chaperone present.   Constitutional:       Appearance: Normal appearance.   HENT:      Head: Normocephalic and atraumatic.   Eyes:      Extraocular Movements: Extraocular movements intact.   Cardiovascular:      Rate and Rhythm: Normal rate.   Pulmonary:      Effort: Pulmonary effort is normal.   Genitourinary:     General: Normal vulva.      Vagina: Normal.      Cervix: Normal.      Uterus: Normal.       Adnexa: Right adnexa normal and left adnexa normal.      Comments: No evidence of genitourinary syndrome of menopause  Skin:     General: Skin is warm and dry.   Neurological:      Mental Status: She is alert.              Assessment and Plan:    Menopause with genitourinary syndrome of menopause  Continue 0.075 Climara patch twice weekly, consider increasing pending results of TSH and liver function panel due to mild abnormalities at last check  If LFTs increasingly abnormal, may need to discontinue Climara for additional workup  Continue estrace cream three times weekly as maintenance for genitourinary syndrome of menopause   Discussed vaginal moisturizers and lubricants for additional symptom control with estrace    RTC 1 year or sooner PRN    D/w Dr. Garlan Fair    Gordan Payment MD  PGY-3 Obstetrics and Gynecology

## 2020-05-01 DIAGNOSIS — R232 Flushing: Secondary | ICD-10-CM

## 2020-05-05 ENCOUNTER — Encounter: Admit: 2020-05-05 | Discharge: 2020-05-05

## 2020-05-27 ENCOUNTER — Encounter: Admit: 2020-05-27 | Discharge: 2020-05-27

## 2020-05-28 ENCOUNTER — Encounter: Admit: 2020-05-28 | Discharge: 2020-05-28

## 2020-05-28 MED ORDER — ROSUVASTATIN 10 MG PO TAB
10 mg | ORAL_TABLET | Freq: Every day | ORAL | 3 refills | 90.00000 days | Status: AC
Start: 2020-05-28 — End: ?

## 2020-05-28 MED ORDER — EZETIMIBE 10 MG PO TAB
10 mg | ORAL_TABLET | Freq: Every day | ORAL | 3 refills | Status: AC
Start: 2020-05-28 — End: ?

## 2020-06-24 ENCOUNTER — Encounter

## 2020-06-24 DIAGNOSIS — Z1231 Encounter for screening mammogram for malignant neoplasm of breast: Secondary | ICD-10-CM

## 2020-07-14 ENCOUNTER — Encounter: Admit: 2020-07-14 | Discharge: 2020-07-14 | Payer: No Typology Code available for payment source

## 2020-07-14 ENCOUNTER — Ambulatory Visit: Admit: 2020-07-14 | Discharge: 2020-07-14 | Payer: No Typology Code available for payment source

## 2020-07-14 DIAGNOSIS — Z1231 Encounter for screening mammogram for malignant neoplasm of breast: Secondary | ICD-10-CM

## 2020-07-16 ENCOUNTER — Encounter: Admit: 2020-07-16 | Discharge: 2020-07-16 | Payer: No Typology Code available for payment source

## 2020-07-21 ENCOUNTER — Encounter: Admit: 2020-07-21 | Discharge: 2020-07-21 | Payer: No Typology Code available for payment source

## 2020-07-21 ENCOUNTER — Ambulatory Visit: Admit: 2020-07-21 | Discharge: 2020-07-22 | Payer: No Typology Code available for payment source

## 2020-07-21 DIAGNOSIS — R059 Cough: Secondary | ICD-10-CM

## 2020-07-21 DIAGNOSIS — J309 Allergic rhinitis, unspecified: Secondary | ICD-10-CM

## 2020-07-21 DIAGNOSIS — R0600 Dyspnea, unspecified: Secondary | ICD-10-CM

## 2020-07-21 MED ORDER — OLOPATADINE 0.6 % NA SPRY
1-2 | Freq: Two times a day (BID) | NASAL | 3 refills | 60.00000 days | Status: AC | PRN
Start: 2020-07-21 — End: ?

## 2020-07-21 MED ORDER — KETOTIFEN FUMARATE 0.025 % (0.035 %) OP DROP
1 [drp] | Freq: Two times a day (BID) | OPHTHALMIC | 3 refills | Status: AC | PRN
Start: 2020-07-21 — End: ?

## 2020-07-22 ENCOUNTER — Encounter: Admit: 2020-07-22 | Discharge: 2020-07-22 | Payer: No Typology Code available for payment source

## 2020-07-24 ENCOUNTER — Encounter: Admit: 2020-07-24 | Discharge: 2020-07-24 | Payer: No Typology Code available for payment source

## 2020-07-24 MED ORDER — EPINEPHRINE 0.3 MG/0.3 ML IJ ATIN
INTRAMUSCULAR | 0 refills | 30.00000 days | Status: AC
Start: 2020-07-24 — End: ?

## 2020-07-25 ENCOUNTER — Encounter: Admit: 2020-07-25 | Discharge: 2020-07-25 | Payer: No Typology Code available for payment source

## 2020-07-25 MED ORDER — AUVI-Q 0.3 MG/0.3 ML IJ ATIN
ORAL | 0 refills | 1.00000 days | Status: AC
Start: 2020-07-25 — End: ?

## 2020-07-27 NOTE — Progress Notes
Date of Service: 07/30/2020    Subjective:      Holly Maldonado is a 60 y.o. female  [id number]    History of Present Illness   Holly Maldonado is a 60 y.o. female  w/ a pmh significant for MDD, PTSD, PDD, ADHD, and Bulimia. Who is seen for consultation for Spravato/Ketamine.     Since her last visit, patient reports that she was admitted in late 2021 and had a medication change she is currently on Prozac 60 mg p.o. daily and Klonopin 2 mg p.o. nightly.  She had not proceed with Spravato at a different facility as she did not feel comfortable with her provider and is returning today to discuss provider.  Patient continues to note depressed mood, anhedonia, feelings of hopelessness, fatigue, poor concentration, appetite changes she has only subtle PMR.  Denies any SI/HI or AVH currently.  Continues to have some trauma symptoms and PTSD symptoms and denies any ongoing substance use.    Had a long discussion with the patient about the mechanism of action for Spravato and the side effects which include sedation and dissociation, elevated blood pressure, nausea.  Also discussed the need for arranging travel to and from the clinic on administration day and that the patient should not drive or operate heavy machinery on the day of dosing.  Reviewed the REMS protocol and discussed medication interactions which include continuation of antidepressant therapy with Prozac 60mg  PO Daily and Klonopin 2mg  PO QHS currently.  Limiting the use of benzodiazepines and other sedative hypnotics.  Currently the patient is is taking Klonopin 1mg  PO BID (2mg  at night currently) and was advised not to take these the night before or the day of the procedure.. Discussed interactions of psychostimulants and MAOIs. Currently the patient is is not taking any stimulants or MAOIs.     After discussion of risk and benefits of these the patient was agreeable to pursue Spravato.  Will send a Spravato prescription to the pharmacy and have a pharmacy benefits check run.  Will have the patient complete the REMS paperwork but will not enroll until cost benefit discussion has been had with the patient.  Patient was agreeable this plan and no other acute concerns or complaints.    Past Medication Trials (All of the following were of adequate dose and duration):  -SSRI (Zoloft, Lexapro, Celexa, Paxil, Trintellix)  -SNRIs (Effexor)  -Buspar  -Wellbutrin  -TCA (Amitriptyline, Nortriptyline)  -Abilify  -Rexulti  -Lithium   -TMS    Social History Update:  Denies tobacco, EtOH, or Illicit drug use.     Review of Systems   Constitutional: Positive for appetite change and fatigue. Negative for chills, fever and unexpected weight change.   Respiratory: Negative for shortness of breath.    Cardiovascular: Negative for chest pain, palpitations and leg swelling.   Gastrointestinal: Negative for abdominal pain, blood in stool, constipation, diarrhea, nausea and vomiting.   Musculoskeletal: Negative for arthralgias and myalgias.   Skin: Negative for rash.   Neurological: Negative for dizziness, light-headedness and headaches.   Psychiatric/Behavioral: Positive for decreased concentration, dysphoric mood and sleep disturbance. Negative for confusion, hallucinations, self-injury and suicidal ideas. The patient is nervous/anxious.          Objective:         ? aspirin EC 81 mg tablet Take 81 mg by mouth daily. Take with food.   ? AUVI-Q 0.3 mg/0.3 mL auto-injector Inject 0.3 mg (1 Pen) into thigh if needed for  anaphylactic reaction. May repeat in 5-15 minutes if needed.   ? Cholecalciferol (Vitamin D3) 1,000 unit cap Take 5,000 Units by mouth.   ? clonazePAM (KLONOPIN) 0.5 mg tablet Take 0.5 mg by mouth twice daily.   ? docosahexaenoic acid/epa (FISH OIL PO) Take 3 capsules by mouth daily.   ? EPINEPHrine (EPIPEN 2-PAK) 1 mg/mL injection pen (2-Pack) Inject 0.3 mg (1 Pen) into thigh if needed for anaphylactic reaction. May repeat in 5-15 minutes if needed.   ? estradioL (CLIMARA) 0.075 mg/24 hr patch Apply one patch to top of skin as directed twice weekly.   ? ezetimibe (ZETIA) 10 mg tablet Take one tablet by mouth daily.   ? flecainide (TAMBOCOR) 100 mg tablet Take one tablet by mouth twice daily. (Patient taking differently: Take 50 mg by mouth twice daily.)   ? ketotifen (ZADITOR) 0.025 % (0.035 %) ophthalmic solution Place one drop into or around eye(s) twice daily as needed.   ? levothyroxine (SYNTHROID) 125 mcg tablet Take one tablet by mouth daily and recheck a TSH in 2 months (Patient taking differently: Take 112 mcg by mouth daily.)   ? magnesium oxide (MAG-OX) 400 mg (241.3 mg magnesium) tablet Take 400 mg by mouth daily.   ? olopatadine (PATANASE) 0.6 % nasal spray Apply one spray to two sprays to each nostril as directed twice daily as needed.   ? rOPINIRole (REQUIP) 0.5 mg tablet Take 0.5 mg by mouth at bedtime daily.   ? rosuvastatin (CRESTOR) 10 mg tablet Take one tablet by mouth daily.   ? zinc sulfate 220 mg (50 mg elemental zinc) capsule Take 220 mg by mouth daily.       Vitals:    07/30/20 1028   BP: 138/87   BP Source: Arm, Right Upper   Patient Position: Sitting   Pulse: 75   Weight: 88.3 kg (194 lb 9.6 oz)   Height: 162.6 cm (5' 4)     Body mass index is 33.4 kg/m?Marland Kitchen     Physical Exam    Mental Status Evaluation:    General/Constitutional: Appears stated age, in personal attire, good hygiene and grooming.   Eye Contact: Fair  Behavior: Appropriate and cooperative  Speech: regular rate and rhythm, normal tone and volume.   Mood: Depressed  Affect: Sullen, mood congruent  Thought Process: Linear, organized, easy to follow and understand.  Thought Content: Denies SI/HI.   Perception: Denies AVH. No evidence of paranoia, delusions, or illusions.   Associations: Intact  Insight/Judgment: Good/Good    Hamilton Rating Scale for Depression  Depressed Mood: Communicates feeling states non-verbally, i.e. through facial expression, posture, voice and tendency to weep  Feelings of Guilt: Ideas of guilt or rumination over past errors or sinful deeds  Suicide: Feels life is not worth living  Insomnia: Early in the Night: Complains of nightly difficulty falling asleep  Insomnia: Middle of the Night: Waking during the night - any getting out of bed rates 2 (except for purposes of voiding)  Insomnia: Early Hours of the Morning: Waking in early hours of the morning but goes back to sleep  Work and Activities: Decrease in actual time spent in activities or decrease in productivity.  Rate 3 if the patient does not spend at least three hours a day in activities (job or hobbies) excluding routine chores  Retardation: Obvious retardation during the interview  Agitation: Playing with hands, hair, etc.  Anxiety Psychic: Apprehensive attitude apparent in face or speech  Anxiety Somatic: Moderate  Somatic  Symptoms Gastro-Intestinal: Loss of appetite but eating without staff encouragement. Heavy feelings in abdomen  General Somatic Symptoms: Heaviness in limbs, back or head. Backaches, headaches, muscle aches. Loss of energy and fatigability  Genital Symptoms: Absent  Hypochondriasis: Frequent complaints, requests for help, etc.  Loss of Weight: Probable weight loss associated with present illness  Insight: Acknowledges being depressed and ill  HAM-D Total Score: 29           Assessment and Plan:     Summary/Formulation:   Holly Maldonado is a 60 y.o. female with a history of MDD, PTSD, PDD, ADHD, and Bulimia who presents to [facility] outpatient psychiatry for f/u.     Problem   Chronic Post-Traumatic Stress Disorder (Ptsd)    07/30/2020   Stable-Chronic  -Trauma: Childhood trauma  -Current Symptoms:  -Triggers:  Plan:  The following medications will be continued for the above symptoms:  -Continue Klonopin 2mg  PO QHS  -Continue Prozac 60mg  PO Daily  -Recommend CBT-I coach app, CPT coach app, PTSD Coach app, and Dream EZ app  -Recommend visiting [email or web address] and looking into Trauma Focused CBT, CPT, or EMDR  -Recommend Self Authoring program         Severe Episode of Recurrent Major Depressive Disorder, Without Psychotic Features (Hcc)    07/30/2020   Stable-Chronic  -Current symptoms: SIGECAPS (7/9)  -Denies SI/HI and AVH  PHQ-9  PHQ-9 Score: 22 (10/17/2019 11:28 AM)          Plan:  The following medications will be continued for the above symptoms:  -Continue Klonopin 2mg  PO QHS  -Continue Prozac 60mg  PO Daily  -Spravato sent to pharmacy for benefits check     Bulimia    07/30/2020   Stable-Chronic  -Current Symptoms: Denies  Plan:  No medications prescribed for this problem                     The proposed treatment plan was discussed with the patient/guardian who was provided the opportunity to ask questions and make suggestions regarding alternative treatment.     Safety plan discussed and outlined in detail on after visit summary    Rae Mar, MD    This note was composed with assistance of Dragon Dictation.  There may be transcriptional errors. If you have questions regarding the note please reach out to Dr. Kathreen Devoid directly.

## 2020-07-27 NOTE — Patient Instructions
It was nice to see you in clinic today.  If questions arise, you can reach the clinic by calling [phone].    Actions from today's visit:    Continue all medications      Dr. Lajean Saver    Please direct medication refill requests to your pharmacy.    In the event of a safety concern or suicidal thoughts, call 911 or go to the nearest emergency room. National Suicide Prevention Lifeline 863 610 9854 (Talk). Crisis Text Hotline (text 438-745-8890).     The [facility] phone line is a resource for talk support in non-emergency situations (1-866-WARMEAR).      Recognizing Suicide Warning Signs in Yourself     People who are thinking about suicide may not know they are depressed. Certain thoughts, feelings, and actions can be signals that let you know you may need help. The best thing you can do is watch for signs that you may be at risk. Then, ask for help. You can talk with your regular healthcare provider or get help from a mental health provider.   Depression  Depression is a treatable illness, just like diabetes or heart disease. And like those illnesses, depression is not something that you can just snap out of. To feel better, treatment is needed before depression gets to a point that it can endanger your life. To know if depression is causing you to feel like ending your life, ask yourself:   ? Do I feel worthless, guilty, helpless, or hopeless?  ? Have I been feeling sad, down, or blue on most days?  ? Have I lost interest in my work or people I used to enjoy?  ? Do I have trouble sleeping or do I sleep too much?  ? Do I eat more or less than normal?  ? Do I feel tired, weak, and low on energy?  ? Do I feel restless and unable to sit still?  ? Do I have trouble thinking or making choices?  ? Do I cry more than normal?  ? Do I feel life isn't worth living?  Warning signs for suicide  Call your healthcare provider or get help right away if you have any of the warning signs below. You can also call a mental health clinic or a 24-hour suicide crisis hotline for help and support. Warning signs for suicide include:   ? Thinking often about taking your life  ? Planning how you may attempt it  ? Talking or writing about committing suicide  ? Feeling that death is the only solution to your problems  ? Feeling a pressing need to make out your will or arrange your funeral  ? Giving away things you own  ? Taking part in risky behaviors, such as having sex with someone you don't know, or drinking and driving  ? Buying a lethal weapon, such as a gun, or hoarding medicines that could be used in an overdose  Call 911  If you are in immediate risk of harming yourself, call 911  To learn more  For more information about depression and suicide prevention:   ? [employer] of Mental Health 5738833135  ? National Suicide Prevention Lifeline 754-456-5877 (800-273-TALK) www.suicidepreventionlifeline.org  ? [employer] on Mental Illness 800-950-6264www.nami.org  ? Mental Health America800-969-6642www.nmha.org  ? National Suicide Hotline800-(765)377-8036 (800-SUICIDE)  StayWell last reviewed this educational content on 09/08/2018  ? 2000-2021 [employer] [place]. All rights reserved. This information is not intended as a substitute for professional medical care. Always  follow your healthcare professional's instructions.        Local Warm Line (not for crisis):  [facility]li[facility]            phone: (250)019-7541 or 8014861098 (toll free)  [facility]                         4:00pm - 10:00pm every evening    Local Crisis Lines:      Name: [place] Crisis Line 24/7                phone:    8503129542      Name: [facility] Mental Health 24/7                phone:    [phone]      Name: [facility] Mental Health Crisis Line 24/7            phone:    [phone]    National Suicide Prevention Lifelines & Textlines:      Dial (925)688-6682 or 1-800-SUICIDE 807-836-8351) Espa?ol: 860-271-6078     or chat: DealerSpiff.com.pt     or text:  National Crisis Text Line - Text HOME to 210-736-3544     or text:  [facility] [place] - Text HOPE to 201[address]    Local emergency services:       [facility] - Emergency Department            [address]      [place], [place] [address]                                 phone: [phone]    Or call 911

## 2020-07-30 ENCOUNTER — Ambulatory Visit: Admit: 2020-07-30 | Discharge: 2020-07-30 | Payer: No Typology Code available for payment source

## 2020-07-30 ENCOUNTER — Encounter: Admit: 2020-07-30 | Discharge: 2020-07-30 | Payer: No Typology Code available for payment source

## 2020-07-30 DIAGNOSIS — R059 Cough: Secondary | ICD-10-CM

## 2020-07-30 DIAGNOSIS — F4312 Post-traumatic stress disorder, chronic: Secondary | ICD-10-CM

## 2020-07-30 DIAGNOSIS — F332 Major depressive disorder, recurrent severe without psychotic features: Secondary | ICD-10-CM

## 2020-07-30 DIAGNOSIS — F502 Bulimia nervosa: Secondary | ICD-10-CM

## 2020-07-30 MED ORDER — SPRAVATO 56 MG (28 MG X 2) NA SPRY
56 mg | NASAL | 0 refills | Status: CN
Start: 2020-07-30 — End: ?

## 2020-07-30 MED ORDER — CLONAZEPAM 2 MG PO TAB
2 mg | ORAL_TABLET | Freq: Every evening | ORAL | 0 refills | Status: AC
Start: 2020-07-30 — End: ?

## 2020-08-04 ENCOUNTER — Encounter: Admit: 2020-08-04 | Discharge: 2020-08-04 | Payer: No Typology Code available for payment source

## 2020-08-05 ENCOUNTER — Encounter: Admit: 2020-08-05 | Discharge: 2020-08-05 | Payer: No Typology Code available for payment source

## 2020-08-05 NOTE — Progress Notes
The Prior Authorization for Spravato has been submitted for Holly Maldonado via Cover My Meds.  Will continue to follow.    [place] Patient Advocate  [phone]

## 2020-08-06 ENCOUNTER — Encounter: Admit: 2020-08-06 | Discharge: 2020-08-06 | Payer: No Typology Code available for payment source

## 2020-08-07 ENCOUNTER — Encounter: Admit: 2020-08-07 | Discharge: 2020-08-07 | Payer: No Typology Code available for payment source

## 2020-08-07 ENCOUNTER — Ambulatory Visit: Admit: 2020-08-07 | Discharge: 2020-08-07 | Payer: No Typology Code available for payment source

## 2020-08-07 ENCOUNTER — Ambulatory Visit: Admit: 2020-08-07 | Discharge: 2020-08-08 | Payer: No Typology Code available for payment source

## 2020-08-07 DIAGNOSIS — Z8659 Personal history of other mental and behavioral disorders: Secondary | ICD-10-CM

## 2020-08-07 DIAGNOSIS — R000 Tachycardia, unspecified: Secondary | ICD-10-CM

## 2020-08-07 DIAGNOSIS — E039 Hypothyroidism, unspecified: Secondary | ICD-10-CM

## 2020-08-07 DIAGNOSIS — F99 Mental disorder, not otherwise specified: Secondary | ICD-10-CM

## 2020-08-07 DIAGNOSIS — F32A Depressed: Secondary | ICD-10-CM

## 2020-08-07 DIAGNOSIS — Q615 Medullary cystic kidney: Secondary | ICD-10-CM

## 2020-08-07 DIAGNOSIS — E785 Hyperlipidemia, unspecified: Secondary | ICD-10-CM

## 2020-08-07 DIAGNOSIS — E7800 Pure hypercholesterolemia, unspecified: Secondary | ICD-10-CM

## 2020-08-07 DIAGNOSIS — E221 Hyperprolactinemia: Secondary | ICD-10-CM

## 2020-08-07 DIAGNOSIS — N390 Urinary tract infection, site not specified: Secondary | ICD-10-CM

## 2020-08-07 DIAGNOSIS — I499 Cardiac arrhythmia, unspecified: Secondary | ICD-10-CM

## 2020-08-07 DIAGNOSIS — F4310 Post-traumatic stress disorder, unspecified: Secondary | ICD-10-CM

## 2020-08-07 DIAGNOSIS — G4730 Sleep apnea, unspecified: Secondary | ICD-10-CM

## 2020-08-07 LAB — FREE T4 (FREE THYROXINE) ONLY: Lab: 1.4 ng/dL (ref 0.6–1.6)

## 2020-08-07 LAB — THYROID STIMULATING HORMONE-TSH: Lab: 0 uU/mL — ABNORMAL LOW (ref 0.35–5.00)

## 2020-08-07 LAB — PROLACTIN: Lab: 12 ng/mL (ref 3.3–26.7)

## 2020-08-07 NOTE — Patient Instructions
Take the thyroid medicine empty stomach in morning with water. Wait for atleast one hour before eating anything after taking thyroid medicine. No iron/calcium pills/multivitamin for 4 hours before and after taking thyroid pill. Take 2 pills the next morning if you forget to take the medicine in morning.

## 2020-08-07 NOTE — Progress Notes
Chief Complaint   Patient presents with   ? Hypothyroidism   Pituitary microadenoma    Date of Service: 08/07/2020    Referring physician:  Self, Referral  No address on file    Holly Maldonado is a 60 y.o. female. DOB: [birth year]   MRN#: [id number]    Patient is new to me.  Reviewed previous notes by Dr. Barbara Cower from 2020.  Summarized below:    HPI:  Subjective:    History of Present Illness  Holly Maldonado is a 60 y.o. female   ?  Hypothyroidism: She is on levothyroxine 112 mcg daily.  Dose was reduced in January after TSH was low.  She has dry skin and depression.  She is compliant with medicine and takes it on an empty stomach in morning.  ?  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 He has been followed for the prolactin. She appears to have had regression of pituitary adenoma in imagine 11/2017.  No adenoma was seen in 2020 on MRI.  Last prolactin level was normal.  She denies breast complaints, discharge.    Patient has a history of depression and has been  Lithium, Prozac, Lamictal, Cymbalta in the past.  ?  She has gained weight.    Review of Systems   Constitutional: Negative for fever.   Cardiovascular: Negative for chest pain.       Medical History:   Diagnosis Date   ? Arrhythmia     multiple PVCs from EKG 08/14/12   ? depression    ? Dyslipidemia    ? Endometriosis    ? H/O eating disorder    ? Hematuria     microscopic   ? High cholesterol    ? Hypothyroid    ? Medullary sponge kidney    ? Mental disorder    ? Nephrocalcinosis    ? PTSD (post-traumatic stress disorder)    ? Recurrent UTI    ? Sleep apnea    ? Tachycardia      Surgical History:   Procedure Laterality Date   ? HX TUBAL LIGATION  2010   ? COLONOSCOPY DIAGNOSTIC WITH SPECIMEN COLLECTION BY BRUSHING/ WASHING - FLEXIBLE N/A 12/15/2018    [facility]on[facility]   ? ELECTROCARDIOGRAM     ? HX BLADDER SUSPENSION     ? HX CESAREAN SECTION     ? HX HYSTERECTOMY     ? HX SINUS SURGERY     ? HX TONSILLECTOMY     ? LAPAROSCOPY       Family History   Problem Relation Age of Onset   ? Coronary Artery Disease Father    ? Diabetes Type II Mother    ? Cancer Mother         leukemia   ? Diabetes Mother    ? Heart Attack Mother    ? Coronary Artery Disease Maternal Grandmother    ? Circulatory problem Maternal Grandmother    ? Coronary Artery Disease Maternal Grandfather    ? Heart Attack Maternal Grandfather    ? Heart Surgery Maternal Grandfather    ? Coronary Artery Disease Paternal Grandmother    ? Coronary Artery Disease Paternal Grandfather    ? None Reported Brother    ? None  Reported Son    ? None Reported Daughter    ? None Reported Daughter    ? None Reported Daughter    ? None Reported Son      Social History     Socioeconomic History   ? Marital status: Divorced     Spouse name: Not on file   ? Number of children: 5   ? Years of education: Not on file   ? Highest education level: Not on file   Occupational History   ? Not on file   Tobacco Use   ? Smoking status: Former Smoker     Packs/day: 1.00     Types: Cigarettes     Quit date: 08/22/1992     Years since quitting: 27.9   ? Smokeless tobacco: Never Used   Substance and Sexual Activity   ? Alcohol use: No   ? Drug use: No   ? Sexual activity: Not Currently   Other Topics Concern   ? Not on file   Social History Narrative   ? Not on file         Objective:     ? aspirin EC 81 mg tablet Take 81 mg by mouth daily. Take with food.   ? AUVI-Q 0.3 mg/0.3 mL auto-injector Inject 0.3 mg (1 Pen) into thigh if needed for anaphylactic reaction. May repeat in 5-15 minutes if needed.   ? Cholecalciferol (Vitamin D3) 1,000 unit cap Take 5,000 Units by mouth.   ? clonazePAM (KLONOPIN) 0.5 mg tablet Take 2 mg by mouth at bedtime daily.   ? clonazePAM (KLONOPIN) 2 mg tablet Take one tablet by mouth at bedtime daily.   ? docosahexaenoic acid/epa (FISH OIL PO) Take 3 capsules by mouth daily.   ? EPINEPHrine (EPIPEN 2-PAK) 1 mg/mL injection pen (2-Pack) Inject 0.3 mg (1 Pen) into thigh if needed for anaphylactic reaction. May repeat in 5-15 minutes if needed.   ? esketamine (SPRAVATO) 56 mg (28 mg x 2) nasal spray Insert four sprays into nose as directed every 14 days. Wait 5 minutes between use of each device.   ? estradioL (CLIMARA) 0.075 mg/24 hr patch Apply one patch to top of skin as directed twice weekly.   ? ezetimibe (ZETIA) 10 mg tablet Take one tablet by mouth daily.   ? flecainide (TAMBOCOR) 100 mg tablet Take one tablet by mouth twice daily. (Patient taking differently: Take 50 mg by mouth twice daily.)   ? FLUoxetine (PROZAC) 20 mg capsule Take 60 mg by mouth daily.   ? ketotifen (ZADITOR) 0.025 % (0.035 %) ophthalmic solution Place one drop into or around eye(s) twice daily as needed.   ? levothyroxine (SYNTHROID) 125 mcg tablet Take one tablet by mouth daily and recheck a TSH in 2 months (Patient taking differently: Take 112 mcg by mouth daily.)   ? magnesium oxide (MAG-OX) 400 mg (241.3 mg magnesium) tablet Take 400 mg by mouth daily.   ? olopatadine (PATANASE) 0.6 % nasal spray Apply one spray to two sprays to each nostril as directed twice daily as needed.   ? rOPINIRole (REQUIP) 0.5 mg tablet Take 0.5 mg by mouth at bedtime daily.   ? rosuvastatin (CRESTOR) 10 mg tablet Take one tablet by mouth daily.   ? zinc sulfate 220 mg (50 mg elemental zinc) capsule Take 220 mg by mouth daily.     Vitals:    08/07/20 0851   BP: 117/69   BP Source: Arm, Left Upper   Patient  Position: Sitting   Pulse: 73   Resp: 14   Temp: 36.4 ?C (97.5 ?F)   TempSrc: Temporal   Weight: 87.5 kg (192 lb 12.8 oz)   Height: 162.6 cm (5' 4)   PainSc: Zero     Body mass index is 33.09 kg/m?Marland Kitchen     Physical Exam  Vitals and nursing note reviewed.   Constitutional:       General: She is not in acute distress.     Appearance: Normal appearance. She is not ill-appearing, toxic-appearing or diaphoretic.   HENT:      Head: Normocephalic and atraumatic.      Nose: Nose normal.   Eyes:      Conjunctiva/sclera: Conjunctivae normal.   Neurological:      Mental Status: She is alert and oriented to person, place, and time.   Psychiatric:         Mood and Affect: Mood normal.               Lab   Thyroid Studies    Lab Results   Component Value Date/Time    TSH 0.05 (L) 04/30/2020 02:19 PM    No results found for: Elton Sin     11/2018 MRI  There is homogeneous enhancement of the pituitary gland, which   demonstrates a somewhat flattened configuration along the sellar floor   without sellar expansion. The posterior pituitary bright spot is normal in   appearance and location. No discrete sellar or suprasellar mass is   identified. The infundibulum, ?optic chiasm, and cavernous sinuses   demonstrate normal signal and morphology. ?   The ventricles and subarachnoid spaces are normal in size and   configuration. There are a few scattered foci of supratentorial white   matter FLAIR hyperintensity, in a bifrontal and subcortical predominant   distribution. There is no midline shift or mass effect. There is no area   of abnormal contrast enhancement. The vascular flow-voids are   unremarkable. Diffusion weighted imaging is not indicative of acute or   recent infarct.       Outside labs:  02/13/2017  TSH 0.37 (reference 0.40?4.5), free T4 1.1 (reference 0.8? 1.6)  Normal CMP, normal CBC  04/20/2018  Cholesterol 276, LDL 195, HDL 79  TSH 12.12    ?  05/23/2015 MRI brain there is relatively unchanged appearance of 6 mm area of non-enhancement noted within posterior aspect of the right lateral aspect of the pituitary gland concerning for underlying pituitary microadenoma  06/22/2017 bone alkaline phosphatase normal  10/10/2017 ACTH at 9:48 AM 16.66 (reference 7.2?63.3)  10/17/2017 prolactin 42.79 (reference 4.79?23.3) normal at 99.17, FSH and LH both normal 72.36 and 31.69 respectively  11/2017 MRI brain 0.5 x 0.4 x 0.7 cm focal area of nodular enhancement along the left temporal convexity extra-axial space 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  Minimal partial empty sella without convincing evidence of pituitary microadenoma    Assessment and Plan:    Holly Maldonado was seen today for hypothyroidism.    Diagnoses and all orders for this visit:    Class 1 obesity due to excess calories with body mass index (BMI) of 33.0 to 33.9 in adult, unspecified whether serious comorbidity present  -     CORTISOL,SALIVA; Future; Expected date: 08/07/2020  -     CORTISOL,SALIVA; Future; Expected date: 08/07/2020    Hypothyroidism (acquired)  -     THYROID STIMULATING HORMONE-TSH; Future; Expected date: 08/07/2020  -     FREE T4 (  FREE THYROXINE) ONLY; Future; Expected date: 08/07/2020  -     TSH WITH FREE T4 REFLEX; Future; Expected date: 12/29/2020    Hyperprolactinemia (HCC)  -     PROLACTIN; Future; Expected date: 08/07/2020        Assessment  ?  Hx Pititary microadenoma / Partial Empty Sella  05/23/2015 MRI brain there is relatively unchanged appearance of 6 mm area of non-enhancement noted within posterior aspect of the right lateral aspect of the pituitary gland concerning for underlying pituitary microadenoma  10/10/2017 ACTH at 9:48 AM 16.66 (reference 7.2?63.3)  10/17/2017 prolactin 42.79 (reference 4.79?23.3) normal at 99.17, FSH and LH both normal 72.36 and 31.69 respectively  11/2017 MRI brain 0.5 x 0.4 x 0.7 cm focal area of nodular enhancement along the left temporal convexity extra-axial space 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  Minimal partial empty sella without convincing evidence of pituitary microadenoma  2020: No microadenoma was seen.    Prolactinoma  Previous imaging demonstrates microadenoma  10/17/2017 prolactin 42.79 (reference 4.79?23.3)  Repeat prolactin level now.  If prolactin is elevated or higher than the last one then we will consider doing another MRI.  For now hold off on MRI.      Hypothyroidism  Dx: 1610  On replacement.  Check levels now.  Continue levothyroxine 112 mcg daily for now.  ?      40 minutes spent reviewing past records, making plan, documentation.  Electronically signed by Lucrezia Starch, MD 08/07/2020

## 2020-08-07 NOTE — Progress Notes
The Prior Authorization for Holly Maldonado is still pending for Holly Maldonado via Cover My Meds.  Will continue to follow.    [place] Patient Advocate  [phone]

## 2020-08-08 ENCOUNTER — Ambulatory Visit: Admit: 2020-08-08 | Discharge: 2020-08-08 | Payer: No Typology Code available for payment source

## 2020-08-08 ENCOUNTER — Encounter: Admit: 2020-08-08 | Discharge: 2020-08-08 | Payer: No Typology Code available for payment source

## 2020-08-08 DIAGNOSIS — E221 Hyperprolactinemia: Secondary | ICD-10-CM

## 2020-08-08 DIAGNOSIS — E6609 Other obesity due to excess calories: Secondary | ICD-10-CM

## 2020-08-08 DIAGNOSIS — Z6833 Body mass index (BMI) 33.0-33.9, adult: Secondary | ICD-10-CM

## 2020-08-08 DIAGNOSIS — E039 Hypothyroidism, unspecified: Secondary | ICD-10-CM

## 2020-08-09 ENCOUNTER — Encounter: Admit: 2020-08-09 | Discharge: 2020-08-09 | Payer: No Typology Code available for payment source

## 2020-08-09 MED ORDER — LEVOTHYROXINE 100 MCG PO TAB
100 ug | ORAL_TABLET | Freq: Every day | ORAL | 3 refills | 30.00000 days | Status: AC
Start: 2020-08-09 — End: ?

## 2020-08-12 ENCOUNTER — Encounter: Admit: 2020-08-12 | Discharge: 2020-08-12 | Payer: No Typology Code available for payment source

## 2020-08-12 ENCOUNTER — Ambulatory Visit: Admit: 2020-08-12 | Discharge: 2020-08-12 | Payer: No Typology Code available for payment source

## 2020-08-12 DIAGNOSIS — E6609 Other obesity due to excess calories: Secondary | ICD-10-CM

## 2020-08-13 ENCOUNTER — Encounter: Admit: 2020-08-13 | Discharge: 2020-08-13 | Payer: No Typology Code available for payment source

## 2020-08-15 ENCOUNTER — Encounter: Admit: 2020-08-15 | Discharge: 2020-08-15 | Payer: No Typology Code available for payment source

## 2020-08-15 NOTE — Progress Notes
The Prior Authorization for Spravato has been submitted to the [facility] for [place] [employer]s via Cover My Meds.  Will continue to follow.    [place] Patient Advocate  [phone]

## 2020-08-18 ENCOUNTER — Encounter: Admit: 2020-08-18 | Discharge: 2020-08-18 | Payer: No Typology Code available for payment source

## 2020-08-18 NOTE — Progress Notes
The Prior Authorization for Spravato was approved for Holly Maldonado from 08/18/20 to 11/17/20.  The copay is $621.62.  The PA authorization number is [id number].    Copay assistance of $611.62 was obtained for the patient using copay card from manufacturer and now the copay is $10.  Holly Maldonado has stated this copay is affordable.        [place] Patient Advocate  [phone]

## 2020-08-26 ENCOUNTER — Encounter: Admit: 2020-08-26 | Discharge: 2020-08-26 | Payer: No Typology Code available for payment source

## 2020-08-26 MED ORDER — CLONAZEPAM 2 MG PO TAB
2 mg | ORAL_TABLET | Freq: Every evening | ORAL | 0 refills | Status: AC
Start: 2020-08-26 — End: ?

## 2020-08-27 ENCOUNTER — Encounter: Admit: 2020-08-27 | Discharge: 2020-08-27 | Payer: No Typology Code available for payment source

## 2020-08-28 ENCOUNTER — Encounter: Admit: 2020-08-28 | Discharge: 2020-08-28 | Payer: No Typology Code available for payment source

## 2020-08-28 ENCOUNTER — Ambulatory Visit: Admit: 2020-08-28 | Discharge: 2020-08-29 | Payer: No Typology Code available for payment source

## 2020-08-28 DIAGNOSIS — J309 Allergic rhinitis, unspecified: Secondary | ICD-10-CM

## 2020-08-28 NOTE — Progress Notes
New IT vials made. A) 1:1 - 16 doses, 1:10 - 9 doses, 1:100 - 9 doses, 1:1000 - 9 doses. B) 1:1 - 16 doses, 1:10 - 9 doses, 1:100 - 9 doses, 1:1000 - 9 doses.     Billed 86 doses.

## 2020-09-01 ENCOUNTER — Encounter: Admit: 2020-09-01 | Discharge: 2020-09-01 | Payer: No Typology Code available for payment source

## 2020-09-03 ENCOUNTER — Encounter: Admit: 2020-09-03 | Discharge: 2020-09-03 | Payer: No Typology Code available for payment source

## 2020-09-08 ENCOUNTER — Encounter: Admit: 2020-09-08 | Discharge: 2020-09-08 | Payer: No Typology Code available for payment source

## 2020-09-08 ENCOUNTER — Ambulatory Visit: Admit: 2020-09-08 | Discharge: 2020-09-09 | Payer: No Typology Code available for payment source

## 2020-09-08 DIAGNOSIS — J309 Allergic rhinitis, unspecified: Secondary | ICD-10-CM

## 2020-09-08 MED ORDER — ESTRADIOL 0.075 MG/24 HR TD PTWK
1 | MEDICATED_PATCH | TRANSDERMAL | 3 refills | 43.00000 days | Status: AC
Start: 2020-09-08 — End: ?

## 2020-09-08 NOTE — Progress Notes
Pt presents to clinic to begin allergy immunotherapy. Consent signed and placed in scan box. Reviewed allergy immunotherapy process, including protocol and clinic hours. Reviewed the requirement to bring Epi-pens to all appointments and the requirement to wait 30 minutes post-injections. Reviewed s/s of a severe allergic reaction and Epi-pen use. Pt demonstrated correct use of Epi-pen with trainer.  Pt knows to give epi if needed and to seek emergent care. Pt knows to inform us once she is safe if she has an allergic reaction. Pt given information to contact our office.Pt verbalized understanding and her questions were answered

## 2020-09-10 ENCOUNTER — Encounter: Admit: 2020-09-10 | Discharge: 2020-09-10 | Payer: No Typology Code available for payment source

## 2020-09-10 ENCOUNTER — Ambulatory Visit: Admit: 2020-09-10 | Discharge: 2020-09-11 | Payer: No Typology Code available for payment source

## 2020-09-10 DIAGNOSIS — J309 Allergic rhinitis, unspecified: Secondary | ICD-10-CM

## 2020-09-11 ENCOUNTER — Encounter: Admit: 2020-09-11 | Discharge: 2020-09-11 | Payer: No Typology Code available for payment source

## 2020-09-12 ENCOUNTER — Ambulatory Visit: Admit: 2020-09-12 | Discharge: 2020-09-12 | Payer: No Typology Code available for payment source

## 2020-09-12 ENCOUNTER — Encounter: Admit: 2020-09-12 | Discharge: 2020-09-12 | Payer: No Typology Code available for payment source

## 2020-09-12 DIAGNOSIS — F332 Major depressive disorder, recurrent severe without psychotic features: Secondary | ICD-10-CM

## 2020-09-12 DIAGNOSIS — E785 Hyperlipidemia, unspecified: Secondary | ICD-10-CM

## 2020-09-12 DIAGNOSIS — I493 Ventricular premature depolarization: Secondary | ICD-10-CM

## 2020-09-12 DIAGNOSIS — N390 Urinary tract infection, site not specified: Secondary | ICD-10-CM

## 2020-09-12 DIAGNOSIS — F32A Depressed: Secondary | ICD-10-CM

## 2020-09-12 DIAGNOSIS — Z8659 Personal history of other mental and behavioral disorders: Secondary | ICD-10-CM

## 2020-09-12 DIAGNOSIS — G4730 Sleep apnea, unspecified: Secondary | ICD-10-CM

## 2020-09-12 DIAGNOSIS — G4733 Obstructive sleep apnea (adult) (pediatric): Secondary | ICD-10-CM

## 2020-09-12 DIAGNOSIS — I499 Cardiac arrhythmia, unspecified: Secondary | ICD-10-CM

## 2020-09-12 DIAGNOSIS — Q615 Medullary cystic kidney: Secondary | ICD-10-CM

## 2020-09-12 DIAGNOSIS — J309 Allergic rhinitis, unspecified: Secondary | ICD-10-CM

## 2020-09-12 DIAGNOSIS — E039 Hypothyroidism, unspecified: Secondary | ICD-10-CM

## 2020-09-12 DIAGNOSIS — E7800 Pure hypercholesterolemia, unspecified: Secondary | ICD-10-CM

## 2020-09-12 DIAGNOSIS — F4310 Post-traumatic stress disorder, unspecified: Secondary | ICD-10-CM

## 2020-09-12 DIAGNOSIS — F99 Mental disorder, not otherwise specified: Secondary | ICD-10-CM

## 2020-09-12 DIAGNOSIS — R000 Tachycardia, unspecified: Secondary | ICD-10-CM

## 2020-09-12 DIAGNOSIS — R931 Abnormal findings on diagnostic imaging of heart and coronary circulation: Secondary | ICD-10-CM

## 2020-09-12 LAB — BASIC METABOLIC PANEL
CALCIUM: 9.8 mg/dL (ref 8.5–10.6)
EGFR: >60 mL/min (ref >60)
POTASSIUM: 4.6 MMOL/L (ref 3.5–5.1)
SODIUM: 138 MMOL/L (ref 137–147)

## 2020-09-12 LAB — LIPID PROFILE
CHOLESTEROL: 164 mg/dL (ref <200)
LDL: 76 mg/dL (ref <100)
NON HDL CHOLESTEROL: 101 mg/dL (ref 0.4–1.00)
TRIGLYCERIDES: 143 mg/dL (ref <150)
VLDL: 29 mg/dL (ref 7–25)

## 2020-09-12 LAB — MAGNESIUM: MAGNESIUM: 1.9 mg/dL (ref >40)

## 2020-09-12 NOTE — Patient Instructions
-  labs today    -follow up with Dr. Clint BolderBerenbom in 1 year        For NON-URGENT questions please contact us through your MyChart account.   For all medication refills please contact your pharmacy or send a request through MyChart.     For all questions that may need to be addressed urgently please call the nursing triage line at [phone] Monday - Friday 8am-3pm only.  Messages left after this timeframe will be addressed the next business day. Please leave a detailed message with your name, date of birth, and reason for your call. This number is not to be used for emergencies.    To schedule an appointment with an electrophysiology (EP) provider, call [phone], for general cardiology call [phone].     Please allow 10-14 business days for the results of any testing to be reviewed. Please call our office if you have not heard from a nurse within this time frame.    Lab and test results:  As a part of the CARES act, starting 08/09/2019, some results will be released to you via mychart immediately and automatically.  You may see results before your provider sees them; however, your provider will review all these results and then they, or one of their team, will notify you of result information and recommendations.   Critical results will be addressed immediately, but otherwise, please allow us time to get back with you prior to you reaching out to us for questions.  This will usually take about 72 hours for labs and 5-7 days for procedure test results.

## 2020-09-12 NOTE — Progress Notes
Date of Service: 09/12/2020    Holly Maldonado is a 60 y.o. female.       HPI     Holly Maldonado was in the office today for reevaluation.  She is not complaining of palpitation.    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  I have been seeing her since 2014.  At baseline she had 25.5% PVCs years ago.  She has excellent suppression with flecainide.    She has ADHD and when she takes drugs for the ADHD it tends to make her PVCs worse.  However at this point she is not on any ADHD drugs and she is not planning to be    She is actually on total disability now due to severe recurrent depression and her eating disorder (bulimia).  She got disability in the fall 2021.  She is working with a new eating disorder team and she feels like things are going pretty well  She is starting a new drug for depression next week, Spravato which is actually a nasal spray.  There are several black box warnings and a pretty formal process to go through when taking the drug.  It is 1 of those drugs really have to be a certified provider and a certified pharmacy etc.    She is on flecainide is 50 twice daily and she is really not complaining of palpitation  She had a coronary calcium score of 0 in 2021  She had a negative exercise echo in 2021    Last year her weight got up to 215 pounds.  She is back down to 192 which is a BMI of 33.    She has not had a place of her own for a long time now.  She been living with her son in [place] who is an Investment banker, operational but a few months ago she moved in with her daughter who is a traveling Engineer, civil (consulting) (and therefore not in her apartment).  It is a 1 bedroom apartment but in July they will move into a 2 bedroom apartment.    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 Agatston CAC score, ZERO (4/21) 09/12/2020   ? Menopause syndrome 04/30/2020   ? 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 Elevated liver enzymes 04/30/2020   ? Arthralgia of right temporomandibular joint 03/17/2020   ? Chronic sore throat 03/17/2020   ? Chronic post-traumatic stress disorder (PTSD) 03/03/2020     07/30/2020   Stable-Chronic  -Trauma: Childhood trauma  -Current Symptoms:  -Triggers:  Plan:  The following medications will be continued for the above symptoms:  -Continue Klonopin 2mg  PO QHS  -Continue Prozac 60mg  PO Daily  -Recommend CBT-I coach app, CPT coach app, PTSD Coach app, and Dream EZ app  -Recommend visiting [email or web address] and looking into Trauma Focused CBT, CPT, or EMDR  -Recommend Self Authoring program         ? Severe episode of recurrent  major depressive disorder, without psychotic features [facility]) 03/03/2020     07/30/2020   Stable-Chronic  -Current symptoms: SIGECAPS (7/9)  -Denies SI/HI and AVH  PHQ-9  PHQ-9 Score: 22 (10/17/2019 11:28 AM)          Plan:  The following medications will be continued for the above symptoms:  -Continue Klonopin 2mg  PO QHS  -Continue Prozac 60mg  PO Daily  -Spravato sent to pharmacy for benefits check     ? Odynophagia 01/30/2020     No signs of thrush or other abnormalities on examination today.  She declines trial of empiric therapy for thrush.  Reviewed proper inhaler technique and rinsing mouth/brushing teeth after use.  She declines to change inhaler to one that could be used with spacer.  Referral to [facility] placed for further evaluation and consideration of laryngoscopy.     ? Dyspnea on exertion and cough 09/21/2019     My suspicion that asthma was the etiology for [name]'s new onset dyspnea on exertion & cough since April 2021 was low given that she has no h/o asthma, breathing symptoms, or requiring albuterol previously in her lifetime, and in-office spirometry was normal with no further significant reversibility post-bronchodilator, and pulmonary examination has been normal with no change post-bronchodilator administration.  [name] did not feel any improvement in symptoms with albuterol 2p administered in clinic at a previous in-person visit.  That being said, because she's had a normal cardiac evaluation with Holter monitor & stress echo per her cardiologist, normal CXR per her PCP, and asthma was considered on the differential diagnosis, we tried asthma inhaler with Flovent 2p BID x3 weeks and that was been helpful to relieve the dyspnea on exertion.  However, unfortunately, since discontinuing Singulair due to concerns for side effects to this drug, she's developed a non-productive cough.  Ddx includes but isn't limited to poor cardiac conditioning, asthma, upper airway cough syndrome, GERD, and many others.  She does think that Breo 100/54mcg 1p daily was helpful for symptoms.  Complete PFT 01/2020 was normal.  Her PCP completed CXR and CT chest that was unremarkable.      Asthma is on the differential considerations for this patient's symptoms. Given that some of their symptoms are atypical for asthma, further evaluation for possible asthma component to their symptoms with broncho-provocation testing is reasonable. We discussed the method of methacholine challenge today & the potential risks and benefits and they reported understanding. She is no longer having symptoms and is off inhalers at this time.    The patient has had no heart attack or stroke in the past 3 months, uncontrolled hypertension, aortic aneurysm, recent eye surgery or increased ocular or intracranial pressure. FEV1 is greater than 75%-predicted.  We discussed methods, merits, potential risks; we discussed going forward with this test versus leaving it on the back burner now that symptoms have resolved, and she wishes to proceed with methacholine challenge.       ? Family history of premature coronary artery disease 03-Sep-2019     Mom-MI/death at 17  MGF-CAB/death at 52     ? 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 Pure hypercholesterolemia 03/19/2019     2019/09/03 - Calcium Score CT:  The total calcium score is 0. This represents the <10th percentile rank for females age 51-23 years old.      ? Pituitary adenoma (HCC) 09/04/2018   ? Elevated prolactin level 09/04/2018   ? Myalgia 02/16/2018     We discussed that  myalgias are not a typical sign/symptom of IgE mediated aeroallergens sensitization.  I would not expect myalgias to be a result of her IgE positive antibodies to dust mite or allergic rhinitis.  I recommend she follow-up with her primary care provider for further evaluation and management of myalgias.     ? Headache 02/16/2018     12-12-17 report of outside CT sinuses without contrast impression: Mild chronic mucosal thickening within anterior ethmoid air cells, otherwise sinuses normal    We discussed that chronic daily headache that radiates into her neck and shoulders is not a typical sign/symptom of IgE mediated aeroallergens sensitization, or allergic rhinitis or rhinoconjunctivitis.  If she had not already had a CT sinuses, I might of been recommending that she get a CT sinuses and see an ear nose and throat specialist for evaluation of possible chronic sinusitis.  She is already done that, and it is her impression that the ENT physician she saw was not very impressed with her CT sinus results and did not feel that she had chronic sinusitis as an explanation for her chronic headache.    Other differential diagnosis considerations for her chronic daily headache include migraine or chronic tension headache or other neurologic headache disorder, related to her history of mood disorders, or probably many other potential etiologies.  Ultimately I am not the best expert in evaluation of headaches.      I recommend she follow-up with her PCP for further evaluation and/or management of this chronic daily headache she has been having.  Will defer imaging and/or neurology referral to her PCPs discretion at this time.     ? Allergic rhinoconjunctivitis 02/16/2018     12-12-17 total IgE within normal limits   IgE class II or greater to: 2 dust mite species  05-25-18 aeroallergen SPT positive to 2 cockroach species, 2 dust mite species, and 2 molds with appropriate controls  05-25-18 aeroallergen ID positive to 2 trees, 2 grasses, 1 weed, cat, dog, 1 mold with appropriate controls    Continue aeroallergen avoidance measures.  Continue saline sinus rinses daily using either boiled-then-cooled tap water or distilled water to mix with saline packets.  Continue Flonase 1p en daily.  She has previously tried Nasonex, Astelin.  Ipratropium was not tolerated.  Stop patanol and start Zaditor 1 dr Nolon Bussing eye BID PRN itchy/watery eyes.    Start Patanase 1-2 sp en BID PRN.   Continue Zyrtec 10mg  daily.    Proper nasal inhaler technique was demonstrated to minimize risk of nosebleeds previously.  Potential adverse effects of medications were discussed and all questions were answered to the best of my ability previously.    We reviewed package inserts for zafiralukast and zilueton including dosing, monitoring parameters, and potential adverse effects.  We'll leave these on the back burner as there are post marketing reports of mood changes per package inserts of these medications as well.  She's off Singulair per recommendation of PCP due to concerns it was contributing to mood changes.    She may be a candidate for allergy shots in the future if her Cardiologist has no objection.  We discussed aeroallergen immunotherapy including the procedure, potential long term benefits, frequency of office visits, potential slow onset of benefit, potential long duration of therapy/time commitment, cost, and potential risks including but not limited to life-threatening anaphylaxis. We discussed the recommendation to only get injections in a physician's office setting equipped to manage anaphylaxis should it occur, and the recommendation to carry autoinjectable epinephrine  to all injection appointments.  She'll contact insurance about cost & her Cardiologist to ensure they have no objections to allergy shots.     ? Mobitz type 1 second degree atrioventricular block 08/26/2016     07/2016 Sleep study-mostly in REM sleep when OSAS severe  08/2017 Holter-no Mobitz 1 at all     ? Bulimia 10/26/2013     07/30/2020   Stable-Chronic  -Current Symptoms: Denies  Plan:  No medications prescribed for this problem            ? Hypokalemia 10/26/2013     Secondary to Hctz     ? PVC's (premature ventricular contractions) 10/26/2013     Present on 2011 Holter  Suppress w/ exercise 2014  RVOT morphology (LB/Inf axis)  11/06/2018 - Holter:  A Holter monitor provides 48 hours 42 minutes of data.  The rhythm is sinus at 47-117 bpm with mean of 72 bpm.  There are 11 isolated atrial premature beats.  There are no couplets.  There is no SVT.  There is no A. fib or a flutter.  3.9% of all beats are isolated PVCs.  This includes 0.5% bigeminy.  AV conduction is normal.  There are no pauses.  There are multiple diary entries.  The initial diary entry is for heart flutter.  This is sinus rhythm at 67 bpm with no PVCs.  Once in a while which she complains of fluttering she has PVCs but that is the exception not to rule.  Typically when she complains of fluttering she is normal sinus rhythm with no arrhythmia of any kind.  This study is remarkable for almost 4% isolated uniform PVCs.  The patient has multiple complaints of heart fluttering but these do not correlate in any significant way with her PVCs.  These complaints occur when she is sinus rhythm with no arrhythmia.  08/20/2019 - Holter:  A Holter monitor provides 2 days 41 minutes of data.  The rhythm is sinus at 56-139 bpm with a mean of 86 bpm.  There are 2 isolated PACs.  There is 1 couplet.  There are 3 runs of atrial tach, 2 triplets and a 5 beat run.  The 5 beat run is at approximately 136 bpm.  There is no A. fib or a flutter.  24.5% of all beats are PVCs.  These are essentially all isolated PVCs.  40% of these PVCs are bigeminy and about 15% are trigeminy.  There are 13 couplets.  There is no VT.  PVCs are all 1 morphology.  There is no significant diurnal variation in PVC frequency.  AV conduction is normal.  There are no pauses.  There are 7 patient events.  Each is for heart fluttering/racing/pounding.  6 of the 7 show PVCs, not uncommonly bigeminy.  In each case the underlying rhythm is sinus.  The rates are: 94, 95, 95, 104,  112, 112 and 121 bpm.  09/14/2019 - EXED:  Low risk Duke Treadmill Score.  Negative exercise stress echo without evidence of inducible ischemia.  Low risk for cardiovascular complications.?  11/06/2019 - Holter:  A Holter monitor provides 1 day 23 hours 17 minutes of data.  The rhythm is sinus at 64-120 bpm with mean of 78 bpm.  There are 2 isolated PACs.  There are no couplets.  There is no SVT.  There is no A. fib or a flutter.  There are 43 isolated PVCs with no couplets and no VT.  AV conduction is normal.  There  are no pauses.  There are no patient events.  Essentially normal study.  03/11/2020 - Holter:  A Holter monitor provides 2 days 13 minutes of data.  Over 188,000 beats are analyzed.  The rhythm is sinus at 51-130 bpm with a mean of 66 bpm.  There are 8 isolated PACs.  There are no couplets.  There is no SVT.  There is no A. fib or a flutter.  There are just over 6500 isolated PVCs which is 3.46% of all beats.  There are 60 bigeminal beats and 342 trigeminal beats.  There are no couplets.  There is no VT.  It looks like there is 1 PVC morphology.  AV conduction is normal.  ? There are no pauses.  There is a single patient event.  No specific symptom is listed.  This is sinus tachycardia at about 115 bpm.      ? Atrial tachycardia (HCC) 10/26/2013     2014 [facility] TM: 1-2' post exercise- a few PVCs, several 5-10 beat runs of AT at ~120bpm-as of 2015 she does not remember if she had any palpitation w/ the TM     ? Maxillary sinus fracture (HCC) 08/22/2012   ? Medullary sponge kidney 08/22/2012     2010 - HCTZ started by [facility].  Presented as hematuria.  CT-abdomen confirmed     ? Hypothyroid    ? Dyslipidemia    ? OSA on CPAP      07/2016 Moderate-severe, Mob1 in REM sleep when OSAS most severe     ? Palpitations      03/19/10 Holter monitor:  Episodes of ventricular bigeminy associated with palpitations    multiple PVCs from EKG 08/14/12           Review of Systems   Constitutional: Negative.   HENT: Negative.    Eyes: Negative.    Cardiovascular: Negative.    Respiratory: Negative.    Endocrine: Negative.    Hematologic/Lymphatic: Negative.    Skin: Negative.    Musculoskeletal: Negative.    Gastrointestinal: Negative.    Genitourinary: Negative.    Neurological: Negative.    Psychiatric/Behavioral: Negative.    Allergic/Immunologic: Negative.    All other systems reviewed and are negative.      Physical Exam  General Appearance: no acute distress  Skin: warm, moist, no ulcers  HEENT: PERL  Neck Veins: neck veins are flat, neck veins are not distended  Carotid Arteries: normal carotid upstroke bilaterally, no bruits  Chest Inspection: chest is normal in appearance  Auscultation/Percussion: lungs clear to auscultation, no rales, rhonchi, or wheezing  Cardiac Auscultation: Normal S1 & S2, no S3 or S4, no rub  Murmurs: no cardiac murmur  Extremities: no lower extremity edema; 2+ symmetric distal pulses  Abdominal Exam: soft, non-tender, no masses, bowel sounds normal  Liver & Spleen: no organomegaly  Neurologic Exam: normal station and gait      Cardiovascular Studies  Today's EKG is sinus at 66 bpm with no PVCs.  It is a normal tracing  I did not hear any PVCs when I examined her.  Her lipids are 33-year-old    Cardiovascular Health Factors  Vitals BP Readings from Last 3 Encounters:   09/12/20 132/80   08/07/20 117/69   07/30/20 138/87     Wt Readings from Last 3 Encounters:   09/12/20 87.1 kg (192 lb)   08/07/20 87.5 kg (192 lb 12.8 oz)   07/30/20 88.3 kg (194 lb 9.6 oz)  BMI Readings from Last 3 Encounters:   09/12/20 32.96 kg/m?   08/07/20 33.09 kg/m?   07/30/20 33.40 kg/m?      Smoking Social History     Tobacco Use   Smoking Status Former Smoker   ? Packs/day: 1.00   ? Types: Cigarettes   ? Quit date: 08/22/1992   ? Years since quitting: 28.0   Smokeless Tobacco Never Used      Lipid Profile Cholesterol   Date Value Ref Range Status   09/25/2019 181 <200 mg/dL Final     HDL   Date Value Ref Range Status   09/25/2019 63 > OR = 50 mg/dL Final     LDL   Date Value Ref Range Status   09/25/2019 97 mg/dL (calc) Final     Comment:     Reference range: <100     Desirable range <100 mg/dL for primary prevention;    <70 mg/dL for patients with CHD or diabetic patients   with > or = 2 CHD risk factors.     LDL-C is now calculated using the Martin-Hopkins   calculation, which is a validated novel method providing   better accuracy than the Friedewald equation in the   estimation of LDL-C.   Horald Pollen et al. Lenox Ahr. 0981;191(47): 2061-2068   (http://education.[facility].com/faq/FAQ164)       Triglycerides   Date Value Ref Range Status   09/25/2019 113 <150 mg/dL Final      Blood Sugar Hemoglobin A1C   Date Value Ref Range Status   03/28/2019 5.7 4.0 - 6.0 % Final     Comment:     The ADA recommends that most patients with type 1 and type 2 diabetes maintain   an A1c level <7%.       Glucose   Date Value Ref Range Status   09/25/2019 93 65 - 99 mg/dL Final     Comment:                   Fasting reference interval        03/05/2019 83 65 - 99 mg/dL Final     Comment:                   Fasting reference interval        10/04/2018 90  Final   02/08/2017 98 70 - 100 MG/DL Final   82/95/6213 086  Final   03/17/2010 90 65 - 99 mg/dL Final     Comment:                   Fasting reference interval             Problems Addressed Today  Encounter Diagnoses   Name Primary?   ? OSA on CPAP Yes   ? Agatston CAC score, ZERO (4/21)    ? Severe episode of recurrent major depressive disorder, without psychotic features (HCC)    ? Pure hypercholesterolemia    ? PVC's (premature ventricular contractions)        Assessment and Plan     She has no symptomatic palpitation.  I do not think we need to do any monitoring now  I do not think we need to do any imaging now  We will update her lipids    She has obstructive sleep apnea and is compliant with her CPAP.         Current Medications (including today's revisions)  ? aspirin EC 81 mg tablet  Take 81 mg by mouth daily. Every other day.   ? AUVI-Q 0.3 mg/0.3 mL auto-injector Inject 0.3 mg (1 Pen) into thigh if needed for anaphylactic reaction. May repeat in 5-15 minutes if needed.   ? Cholecalciferol (Vitamin D3) 1,000 unit cap Take 5,000 Units by mouth.   ? clonazePAM (KLONOPIN) 0.5 mg tablet Take 2 mg by mouth at bedtime daily.   ? clonazePAM (KLONOPIN) 2 mg tablet Take one tablet by mouth at bedtime daily.   ? docosahexaenoic acid/epa (FISH OIL PO) Take 3 capsules by mouth daily.   ? EPINEPHrine (EPIPEN 2-PAK) 1 mg/mL injection pen (2-Pack) Inject 0.3 mg (1 Pen) into thigh if needed for anaphylactic reaction. May repeat in 5-15 minutes if needed.   ? esketamine (SPRAVATO) 56 mg (28 mg x 2) nasal spray Insert four sprays into nose as directed every 14 days. Wait 5 minutes between use of each device.   ? estradioL (CLIMARA) 0.075 mg/24 hr patch Apply one patch to top of skin as directed twice weekly.   ? ezetimibe (ZETIA) 10 mg tablet Take one tablet by mouth daily.   ? flecainide (TAMBOCOR) 100 mg tablet Take one tablet by mouth twice daily. (Patient taking differently: Take 50 mg by mouth twice daily.)   ? FLUoxetine (PROZAC) 20 mg capsule Take 60 mg by mouth daily.   ? ketotifen (ZADITOR) 0.025 % (0.035 %) ophthalmic solution Place one drop into or around eye(s) twice daily as needed.   ? levothyroxine (SYNTHROID) 100 mcg tablet Take one tablet by mouth daily 30 minutes before breakfast.   ? magnesium oxide (MAG-OX) 400 mg (241.3 mg magnesium) tablet Take 400 mg by mouth daily.   ? olopatadine (PATANASE) 0.6 % nasal spray Apply one spray to two sprays to each nostril as directed twice daily as needed.   ? rOPINIRole (REQUIP) 0.5 mg tablet Take 0.5 mg by mouth at bedtime daily.   ? rosuvastatin (CRESTOR) 10 mg tablet Take one tablet by mouth daily.   ? zinc sulfate 220 mg (50 mg elemental zinc) capsule Take 220 mg by mouth daily.

## 2020-09-14 NOTE — Progress Notes
Spravato (Esketamine) Note    Date of Service: 09/17/2020    Subjective:      Holly Maldonado is a 60 y.o. female  [id number]    Total of 120 minutes were spent on the same day of the visit including preparing to see the patient, obtaining history, performing a medically appropriate examination, counseling and educating the patient, observing and monitoring Spravato Treatment, documenting clinical information in the electronic health record and care coordination.     History of Present Illness   Holly Maldonado is a 60 y.o. female  w/ a pmh significant for MDD, PTSD, and Bulimia. Who is seen today for Spravato Administration    Patient Information:  ? Spravato Charity fundraiser) Rx Verificaiton: Deepika Jeff December 18, 1960 09/17/2020 Yes   ? Spravato (Esketamine) Treatment #: 2  ? Spravato (Esketamine) Dose: 56mg       Pre-Treatment Assessment:  ? Last Food: > 2 Hours? Yes   ? Last Drink: > 30 Minutes? Yes   ? Last Nasal Spray: > 2 Hours? Yes   ? Spravato (Esketamine) Complications or Adverse Effects since last visit?: Lethargy    Since her last visit, the pt states that she had some lethargy and fatigue after the last treatment. She notes that she still has some mild anxiety about the Spravato treatment given the dissociative effects she experienced last time. The pt continues to experience depressed mood, anhedonia, sleep disturbances, appetite changes, feelings of hopelessness, poor concentration, PMA, and passive SI w/o plan or intent. Denies HI and AVH.     ? MADRS Score: 47    ? PHQ-9  PHQ-9 Score: 25 (09/17/2020 11:23 AM)    Over the past 2 weeks, how often have you been bothered by any of the following problems?  Little interest or pleasure in doing things: (!) Nearly Every Day  Feeling down, depressed or hopeless: (!) Nearly Every Day  PHQ-2 Score: 6  If the score is > / = 3, proceed with the questions below:  Trouble falling asleep, staying asleep, or sleeping too much: Nearly Every Day  Feeling tired or having little energy: Nearly Every Day  Poor appetite or overeating: Nearly Every Day  Feeling bad about yourself - or that you're a failure or have let  yourself or your family down: Nearly Every Day  Trouble concentrating on things, such as reading the newspaper or watching television: Nearly Every Day  Moving or speaking so slowly that other people could have noticed. Or, the opposite - being so fidgety or restless that you have been moving around a lot more than usual: Nearly Every Day  Thoughts that you would be better off dead or of hurting yourself in some way: Several Days  PHQ-9 Score: 25    ? HAM-D  Hamilton Rating Scale for Depression  Depressed Mood: Patient reports virtually only these feeling states in his/her spontaneous verbal and non-verbal communication  Feelings of Guilt: Present illness is a punishment. Delusions of guilt  Suicide: Feels life is not worth living  Insomnia: Early in the Night: Complains of nightly difficulty falling asleep  Insomnia: Middle of the Night: Waking during the night - any getting out of bed rates 2 (except for purposes of voiding)  Insomnia: Early Hours of the Morning: Unable to fall asleep again if he/she gets out of bed  Work and Activities: Stopped working because of present illness. Rate 4 if patient engages in no activities except routine chores, or if patient fails to  perform routine chores unassisted  Retardation: Obvious retardation during the interview  Agitation: Fidgetiness  Anxiety Psychic: Apprehensive attitude apparent in face or speech  Anxiety Somatic: Severe  Somatic Symptoms Gastro-Intestinal: Loss of appetite but eating without staff encouragement. Heavy feelings in abdomen  General Somatic Symptoms: Any clear-cut symptom rates 2  Genital Symptoms: Absent  Hypochondriasis: Frequent complaints, requests for help, etc.  Loss of Weight: Probable weight loss associated with present illness  Insight: Acknowledges illness but attributes cause to bad food, climate, overwork, virus, need for rest, etc.  HAM-D Total Score: 35      Intra-Treatment Assessment:  ? Spravato (Esketamine) Start Time: 11:13  ? Spravato (Esketamine) Blood Pressure Checks:  Vitals:    09/17/20 1100 09/17/20 1157 09/17/20 1313   BP: 131/89 (!) 131/95 116/70   BP Source: Arm, Right Upper Arm, Right Upper Arm, Right Upper   Pulse: 66 67 63     ? Spravato (Esketamine) Complications or Adverse Effects: Dissociation and Anxiety  ? Spravato (Esketamine) Recommendations For Next Administration: Continue Current  ? Spravato (Esketamine) Stop Time: 13:13      Post-Treatment and Discharge Assessment:  ? Mood I just feel different  ? Presence of SI/HI and AVH/Paranoia? No  ? Any Physical Complaints: No  ? Spravato REMS Patient Monitoring Form Submitted? Yes     Review of Systems   Constitutional: Positive for appetite change and fatigue. Negative for chills, fever and unexpected weight change.   HENT: Negative for congestion, rhinorrhea and sore throat.    Respiratory: Negative for shortness of breath.    Cardiovascular: Negative for chest pain, palpitations and leg swelling.   Gastrointestinal: Negative for abdominal pain, constipation, diarrhea, nausea and vomiting.   Musculoskeletal: Negative for arthralgias and myalgias.   Skin: Negative for rash.   Neurological: Negative for dizziness, light-headedness and numbness.   Psychiatric/Behavioral: Positive for decreased concentration, dysphoric mood, sleep disturbance and suicidal ideas (Passive SI w/o plan or intent). Negative for confusion, hallucinations and self-injury. The patient is nervous/anxious.        Objective:         ? aspirin EC 81 mg tablet Take 81 mg by mouth daily. Every other day.   ? AUVI-Q 0.3 mg/0.3 mL auto-injector Inject 0.3 mg (1 Pen) into thigh if needed for anaphylactic reaction. May repeat in 5-15 minutes if needed.   ? Cholecalciferol (Vitamin D3) 1,000 unit cap Take 5,000 Units by mouth.   ? clonazePAM (KLONOPIN) 0.5 mg tablet Take 2 mg by mouth at bedtime daily.   ? clonazePAM (KLONOPIN) 2 mg tablet Take one tablet by mouth at bedtime daily.   ? docosahexaenoic acid/epa (FISH OIL PO) Take 3 capsules by mouth daily.   ? EPINEPHrine (EPIPEN 2-PAK) 1 mg/mL injection pen (2-Pack) Inject 0.3 mg (1 Pen) into thigh if needed for anaphylactic reaction. May repeat in 5-15 minutes if needed.   ? [START ON 09/18/2020] esketamine (SPRAVATO) 56 mg (28 mg x 2) nasal spray Insert four sprays into nose as directed twice weekly. Wait 5 minutes between use of each device.  Indications: additional treatment for major depressive disorder   ? estradioL (CLIMARA) 0.075 mg/24 hr patch Apply one patch to top of skin as directed twice weekly.   ? ezetimibe (ZETIA) 10 mg tablet Take one tablet by mouth daily.   ? flecainide (TAMBOCOR) 100 mg tablet Take one tablet by mouth twice daily. (Patient taking differently: Take 50 mg by mouth twice daily.)   ? FLUoxetine (PROZAC) 20  mg capsule Take three capsules by mouth daily. Indications: bulimia, major depressive disorder, posttraumatic stress syndrome   ? ketotifen (ZADITOR) 0.025 % (0.035 %) ophthalmic solution Place one drop into or around eye(s) twice daily as needed.   ? levothyroxine (SYNTHROID) 100 mcg tablet Take one tablet by mouth daily 30 minutes before breakfast.   ? magnesium oxide (MAG-OX) 400 mg (241.3 mg magnesium) tablet Take 400 mg by mouth daily.   ? olopatadine (PATANASE) 0.6 % nasal spray Apply one spray to two sprays to each nostril as directed twice daily as needed.   ? rOPINIRole (REQUIP) 0.5 mg tablet Take 0.5 mg by mouth at bedtime daily.   ? rosuvastatin (CRESTOR) 10 mg tablet Take one tablet by mouth daily.   ? zinc sulfate 220 mg (50 mg elemental zinc) capsule Take 220 mg by mouth daily.       Vitals:    09/17/20 1100 09/17/20 1157 09/17/20 1313   BP: 131/89 (!) 131/95 116/70   BP Source: Arm, Right Upper Arm, Right Upper Arm, Right Upper   Pulse: 66 67 63     There is no height or weight on file to calculate BMI.     Physical Exam    Mental Status Evaluation:    General/Constitutional: Appears stated age, in personal attire, good hygiene and grooming.   Eye Contact: Fair  Behavior: Appropriate and cooperative  Speech: regular rate and rhythm, normal tone and volume.   Mood: Anxious  Affect: Anxious, mood congruent  Thought Process: Linear, organized, easy to follow and understand.  Thought Content: Denies HI. Passive SI w/o plan or intent.   Perception: Denies AVH. No evidence of paranoia, delusions, or illusions.   Associations: Intact  Insight/Judgment: Good/Good         Assessment and Plan:     Summary/Formulation:   IMONIE TUCH is a 60 y.o. female with a history of MDD, PTSD, and Bulimia. who presents to [place] outpatient psychiatry for Spravato Administration    Problem   Severe Episode of Recurrent Major Depressive Disorder, Without Psychotic Features (Hcc)    09/17/2020   Stable-Chronic  -Current symptoms: SIGECAPS (7/9)  -Denies HI and AVH. Passive SI w/o plan or intent  PHQ-9  PHQ-9 Score: 25 (09/17/2020 11:23 AM)    Over the past 2 weeks, how often have you been bothered by any of the following problems?  Little interest or pleasure in doing things: (!) Nearly Every Day  Feeling down, depressed or hopeless: (!) Nearly Every Day  PHQ-2 Score: 6  If the score is > / = 3, proceed with the questions below:  Trouble falling asleep, staying asleep, or sleeping too much: Nearly Every Day  Feeling tired or having little energy: Nearly Every Day  Poor appetite or overeating: Nearly Every Day  Feeling bad about yourself - or that you're a failure or have let  yourself or your family down: Nearly Every Day  Trouble concentrating on things, such as reading the newspaper or watching television: Nearly Every Day  Moving or speaking so slowly that other people could have noticed. Or, the opposite - being so fidgety or restless that you have been moving around a lot more than usual: Nearly Every Day  Thoughts that you would be better off dead or of hurting yourself in some way: Several Days  PHQ-9 Score: 25  Plan:  The following medications will be continued for the above symptoms:  -Continue Klonopin 2mg  PO QHS  -Continue Prozac 60mg   PO Daily  -Continue Spravato 56mg  IN 2x/week Induction              Post Discharge Instructions:  Do Not: Drive, Operate Heavy Machinery, or make major life decisions until the following day.   Contact the clinic with any side effects, questions, or concerns ([phone])    Rae Mar, MD    Patient identified as a high fall risk during pre-visit planning due to the potential for impaired balance during the administration and side effects of Spravato treatment. Although the patient will remain in the clinic and monitored under the supervision of myself and a RN for the entirety of the session, patient will be flagged as a high fall risk.     A high fall risk band was placed on the patient if they needed to leave the room during treatment and the room was marked with a yellow triangle.   The patient was kept in the lowest and safest position possible during the time he/she was present in the clinic.    The patient did not ambulate on their own while in the clinic aside from entering or exiting the clinic when deemed safe to go home. Any patient ambulation during the 2 hours procedure window was done with at least 1 nurse or physician assisting the patient.    This note was composed with assistance of Office manager.  There may be transcriptional errors. If you have questions regarding the note please reach out to Dr. Kathreen Devoid directly.

## 2020-09-14 NOTE — Patient Instructions
It was nice to see you in clinic today.  If questions arise, you can reach the clinic by calling [phone].    Actions from today's visit:  Post Discharge Instructions:  Do Not: Drive, Operate Heavy Machinery, or make major life decisions until the following day.   Contact the clinic with any side effects, questions, or concerns ([phone])    Dr. Lajean Saver    Please direct medication refill requests to your pharmacy.    In the event of a safety concern or suicidal thoughts, call 911 or go to the nearest emergency room. National Suicide Prevention Lifeline 2693587141 (Talk). Crisis Text Hotline (text 437-355-9426).     The [facility] phone line is a resource for talk support in non-emergency situations (1-866-WARMEAR).    Recognizing Suicide Warning Signs in Yourself     People who are thinking about suicide may not know they are depressed. Certain thoughts, feelings, and actions can be signals that let you know you may need help. The best thing you can do is watch for signs that you may be at risk. Then, ask for help. You can talk with your regular healthcare provider or get help from a mental health provider.   Depression  Depression is a treatable illness, just like diabetes or heart disease. And like those illnesses, depression is not something that you can just snap out of. To feel better, treatment is needed before depression gets to a point that it can endanger your life. To know if depression is causing you to feel like ending your life, ask yourself:   ? Do I feel worthless, guilty, helpless, or hopeless?  ? Have I been feeling sad, down, or blue on most days?  ? Have I lost interest in my work or people I used to enjoy?  ? Do I have trouble sleeping or do I sleep too much?  ? Do I eat more or less than normal?  ? Do I feel tired, weak, and low on energy?  ? Do I feel restless and unable to sit still?  ? Do I have trouble thinking or making choices?  ? Do I cry more than normal?  ? Do I feel life isn't worth living?  Warning signs for suicide  Call your healthcare provider or get help right away if you have any of the warning signs below. You can also call a mental health clinic or a 24-hour suicide crisis hotline for help and support. Warning signs for suicide include:   ? Thinking often about taking your life  ? Planning how you may attempt it  ? Talking or writing about committing suicide  ? Feeling that death is the only solution to your problems  ? Feeling a pressing need to make out your will or arrange your funeral  ? Giving away things you own  ? Taking part in risky behaviors, such as having sex with someone you don't know, or drinking and driving  ? Buying a lethal weapon, such as a gun, or hoarding medicines that could be used in an overdose  Call 911  If you are in immediate risk of harming yourself, call 911  To learn more  For more information about depression and suicide prevention:   ? [employer] of Mental Health (480) 873-7053  ? National Suicide Prevention Lifeline 925-089-6031 (800-273-TALK) www.suicidepreventionlifeline.org  ? [employer] on Mental Illness 800-950-6264www.nami.org  ? Mental Health America800-969-6642www.nmha.org  ? National Suicide Hotline800-7828247610 (800-SUICIDE)  StayWell last reviewed this educational content on 09/08/2018  ?  2000-2021 [employer], [place]. All rights reserved. This information is not intended as a substitute for professional medical care. Always follow your healthcare professional's instructions.        Local Warm Line (not for crisis):  [facility] WarmliNe            phone: 754-807-6558 or 401-217-9828 (toll free)  [facility]                         4:00pm - 10:00pm every evening    Local Crisis Lines:      Name: [place], [place] Crisis Line 24/7                phone:    [phone]      Name: [facility] Mental Health 24/7                phone:    [phone]      Name: [facility] Mental Health Crisis Line 24/7            phone:    [phone]    National Suicide Prevention Lifelines & Textlines:      Dial 316 041 5598 or 1-800-SUICIDE 938-560-2810)                    Espa?ol: (989) 662-7855     or chat: DealerSpiff.com.pt     or text:  National Crisis Text Line - Text HOME to (404) 173-9954     or text:  [facility] - Text HOPE to 20121    Local emergency services:       [facility] - Emergency Department            [address]      [place], [place] [address]                                 phone: [phone]    Or call 911

## 2020-09-14 NOTE — Patient Instructions
It was nice to see you in clinic today.  If questions arise, you can reach the clinic by calling [phone].    Actions from today's visit:  Post Discharge Instructions:  Do Not: Drive, Operate Heavy Machinery, or make major life decisions until the following day.   Contact the clinic with any side effects, questions, or concerns ([phone])    Dr. Jonne Rote    Please direct medication refill requests to your pharmacy.    In the event of a safety concern or suicidal thoughts, call 911 or go to the nearest emergency room. National Suicide Prevention Lifeline 800-273-8255 (Talk). Crisis Text Hotline (text 741741).     The [facility] phone line is a resource for talk support in non-emergency situations (1-866-WARMEAR).

## 2020-09-14 NOTE — Progress Notes
Spravato (Esketamine) Note    Date of Service: 09/15/2020    Subjective:      Holly Maldonado is a 60 y.o. female  [id number]    Total of 120 minutes were spent on the same day of the visit including preparing to see the patient, obtaining history, performing a medically appropriate examination, counseling and educating the patient, observing and monitoring Spravato Treatment, documenting clinical information in the electronic health record and care coordination.     History of Present Illness   Holly Maldonado is a 60 y.o. female  w/ a pmh significant for MDD, PTSD, and Bulimia. Who is seen today for Spravato Administration    Patient Information:  ? Spravato Charity fundraiser) Rx Verificaiton: Holly Maldonado April 02, 1961 09/15/2020 Yes   ? Spravato (Esketamine) Treatment #: 1  ? Spravato (Esketamine) Dose: 56mg       Pre-Treatment Assessment:  ? Last Food: > 2 Hours? Yes   ? Last Drink: > 30 Minutes? Yes   ? Last Nasal Spray: > 2 Hours? Yes   ? Spravato (Esketamine) Complications or Adverse Effects since last visit?: NA    Since her last visit, the pt reports continued depressed mood, anhedonia, multiple sleep disturbances, Fatiuge, Poor appetite, poor concentration, PMR, and Passive SI w/o plan or intent. Denies HI and AVH.     ? MADRS Score: 47    ? PHQ-9  PHQ-9 Score: 25 (09/15/2020 11:03 AM)    Over the past 2 weeks, how often have you been bothered by any of the following problems?  Little interest or pleasure in doing things: (!) Nearly Every Day  Feeling down, depressed or hopeless: (!) Nearly Every Day  PHQ-2 Score: 6  If the score is > / = 3, proceed with the questions below:  Trouble falling asleep, staying asleep, or sleeping too much: Nearly Every Day  Feeling tired or having little energy: Nearly Every Day  Poor appetite or overeating: Nearly Every Day  Feeling bad about yourself - or that you're a failure or have let  yourself or your family down: Nearly Every Day  Trouble concentrating on things, such as reading the newspaper or watching television: Nearly Every Day  Moving or speaking so slowly that other people could have noticed. Or, the opposite - being so fidgety or restless that you have been moving around a lot more than usual: Nearly Every Day  Thoughts that you would be better off dead or of hurting yourself in some way: Several Days  PHQ-9 Score: 25    ? HAM-D  Hamilton Rating Scale for Depression  Depressed Mood: Patient reports virtually only these feeling states in his/her spontaneous verbal and non-verbal communication  Feelings of Guilt: Present illness is a punishment. Delusions of guilt  Suicide: Feels life is not worth living  Insomnia: Early in the Night: Complains of nightly difficulty falling asleep  Insomnia: Middle of the Night: Waking during the night - any getting out of bed rates 2 (except for purposes of voiding)  Insomnia: Early Hours of the Morning: Unable to fall asleep again if he/she gets out of bed  Work and Activities: Stopped working because of present illness. Rate 4 if patient engages in no activities except routine chores, or if patient fails to perform routine chores unassisted  Retardation: Obvious retardation during the interview  Agitation: Fidgetiness  Anxiety Psychic: Apprehensive attitude apparent in face or speech  Anxiety Somatic: Severe  Somatic Symptoms Gastro-Intestinal: Loss of appetite but eating  without staff encouragement. Heavy feelings in abdomen  General Somatic Symptoms: Any clear-cut symptom rates 2  Genital Symptoms: Absent  Hypochondriasis: Frequent complaints, requests for help, etc.  Loss of Weight: Probable weight loss associated with present illness  Insight: Acknowledges illness but attributes cause to bad food, climate, overwork, virus, need for rest, etc.  HAM-D Total Score: 35      Intra-Treatment Assessment:  ? Spravato (Esketamine) Start Time: 10:58  ? Spravato (Esketamine) Blood Pressure Checks:  Vitals:    09/15/20 1053 09/15/20 1138 09/15/20 1258   BP: (!) 144/90 (!) 150/99 (!) 151/104   BP Source: Arm, Right Upper Arm, Right Upper Arm, Right Upper   Pulse: 72  73     ? Spravato (Esketamine) Complications or Adverse Effects: Dissociation, Nausea and/or Vomiting, Hyposthesia, Anxiety, Elevated Blood Pressure, Bad Taste or Sore Throat and Pt was held in the clinic for 20 minutes after the end of the 2 hour treatment session for continued monitoring and was subsequently discharged home.  ? Spravato (Esketamine) Recommendations For Next Administration: Continue current dose  ? Spravato (Esketamine) Stop Time: 12:58      Post-Treatment and Discharge Assessment:  ? Mood Okay  ? Presence of SI/HI and AVH/Paranoia? No  ? Any Physical Complaints: Yes  Weakness  ? Spravato REMS Patient Monitoring Form Submitted? Yes     Review of Systems   Constitutional: Positive for appetite change and fatigue. Negative for chills, fever and unexpected weight change.   HENT: Positive for congestion, postnasal drip and rhinorrhea. Negative for sore throat.    Respiratory: Negative for shortness of breath.    Cardiovascular: Negative for chest pain, palpitations and leg swelling.   Gastrointestinal: Negative for abdominal pain, blood in stool, constipation, diarrhea, nausea and vomiting.   Musculoskeletal: Negative for arthralgias and myalgias.   Skin: Negative for rash.   Neurological: Negative for dizziness, light-headedness and numbness.   Psychiatric/Behavioral: Positive for decreased concentration, dysphoric mood, sleep disturbance and suicidal ideas (Passive SI w/o plan or intent). Negative for confusion, hallucinations and self-injury. The patient is nervous/anxious.        Objective:         ? aspirin EC 81 mg tablet Take 81 mg by mouth daily. Every other day.   ? AUVI-Q 0.3 mg/0.3 mL auto-injector Inject 0.3 mg (1 Pen) into thigh if needed for anaphylactic reaction. May repeat in 5-15 minutes if needed.   ? Cholecalciferol (Vitamin D3) 1,000 unit cap Take 5,000 Units by mouth.   ? clonazePAM (KLONOPIN) 0.5 mg tablet Take 2 mg by mouth at bedtime daily.   ? clonazePAM (KLONOPIN) 2 mg tablet Take one tablet by mouth at bedtime daily.   ? docosahexaenoic acid/epa (FISH OIL PO) Take 3 capsules by mouth daily.   ? EPINEPHrine (EPIPEN 2-PAK) 1 mg/mL injection pen (2-Pack) Inject 0.3 mg (1 Pen) into thigh if needed for anaphylactic reaction. May repeat in 5-15 minutes if needed.   ? esketamine (SPRAVATO) 56 mg (28 mg x 2) nasal spray Insert four sprays into nose as directed every 14 days. Wait 5 minutes between use of each device.   ? estradioL (CLIMARA) 0.075 mg/24 hr patch Apply one patch to top of skin as directed twice weekly.   ? ezetimibe (ZETIA) 10 mg tablet Take one tablet by mouth daily.   ? flecainide (TAMBOCOR) 100 mg tablet Take one tablet by mouth twice daily. (Patient taking differently: Take 50 mg by mouth twice daily.)   ? FLUoxetine (PROZAC) 20  mg capsule Take 60 mg by mouth daily.   ? ketotifen (ZADITOR) 0.025 % (0.035 %) ophthalmic solution Place one drop into or around eye(s) twice daily as needed.   ? levothyroxine (SYNTHROID) 100 mcg tablet Take one tablet by mouth daily 30 minutes before breakfast.   ? magnesium oxide (MAG-OX) 400 mg (241.3 mg magnesium) tablet Take 400 mg by mouth daily.   ? olopatadine (PATANASE) 0.6 % nasal spray Apply one spray to two sprays to each nostril as directed twice daily as needed.   ? rOPINIRole (REQUIP) 0.5 mg tablet Take 0.5 mg by mouth at bedtime daily.   ? rosuvastatin (CRESTOR) 10 mg tablet Take one tablet by mouth daily.   ? zinc sulfate 220 mg (50 mg elemental zinc) capsule Take 220 mg by mouth daily.       Vitals:    09/15/20 1053 09/15/20 1138 09/15/20 1258   BP: (!) 144/90 (!) 150/99 (!) 151/104   BP Source: Arm, Right Upper Arm, Right Upper Arm, Right Upper   Pulse: 72  73     There is no height or weight on file to calculate BMI.     Physical Exam    Mental Status Evaluation:    General/Constitutional: Appears stated age, in personal attire, good hygiene and grooming.   Eye Contact: Fair  Behavior: Appropriate and cooperative  Speech: regular rate and rhythm, normal tone and volume.   Mood: Anxious  Affect: Anxious, mood congruent  Thought Process: Linear, organized, easy to follow and understand.  Thought Content: Denies HI. Passive SI w/o plan or intent.    Perception: Denies AVH. No evidence of paranoia, delusions, or illusions.   Associations: Intact  Insight/Judgment: Good/Good         Assessment and Plan:     Summary/Formulation:   Holly Maldonado is a 60 y.o. female with a history of MDD, PTSD, and Bulimia. who presents to [facility] for Spravato Administration    Problem   Severe Episode of Recurrent Major Depressive Disorder, Without Psychotic Features (Hcc)    09/15/2020   Stable-Chronic  -Current symptoms: SIGECAPS (7/9)  -Denies HI and AVH. Passive SI w/o plan or intent  PHQ-9  PHQ-9 Score: 25 (09/15/2020 11:03 AM)    Over the past 2 weeks, how often have you been bothered by any of the following problems?  Little interest or pleasure in doing things: (!) Nearly Every Day  Feeling down, depressed or hopeless: (!) Nearly Every Day  PHQ-2 Score: 6  If the score is > / = 3, proceed with the questions below:  Trouble falling asleep, staying asleep, or sleeping too much: Nearly Every Day  Feeling tired or having little energy: Nearly Every Day  Poor appetite or overeating: Nearly Every Day  Feeling bad about yourself - or that you're a failure or have let  yourself or your family down: Nearly Every Day  Trouble concentrating on things, such as reading the newspaper or watching television: Nearly Every Day  Moving or speaking so slowly that other people could have noticed. Or, the opposite - being so fidgety or restless that you have been moving around a lot more than usual: Nearly Every Day  Thoughts that you would be better off dead or of hurting yourself in some way: Several Days  PHQ-9 Score: 25  Plan:  The following medications will be continued for the above symptoms:  -Continue Klonopin 2mg  PO QHS  -Continue Prozac 60mg  PO Daily  -Start Spravato  56mg  IN 2x/week Induction              Post Discharge Instructions:  Do Not: Drive, Operate Heavy Machinery, or make major life decisions until the following day.   Contact the clinic with any side effects, questions, or concerns ([phone])    Rae Mar, MD    Patient identified as a high fall risk during pre-visit planning due to the potential for impaired balance during the administration and side effects of Spravato treatment. Although the patient will remain in the clinic and monitored under the supervision of myself and a RN for the entirety of the session, patient will be flagged as a high fall risk.     A high fall risk band was placed on the patient if they needed to leave the room during treatment and the room was marked with a yellow triangle.   The patient was kept in the lowest and safest position possible during the time he/she was present in the clinic.    The patient did not ambulate on their own while in the clinic aside from entering or exiting the clinic when deemed safe to go home. Any patient ambulation during the 2 hours procedure window was done with at least 1 nurse or physician assisting the patient.    This note was composed with assistance of Office manager.  There may be transcriptional errors. If you have questions regarding the note please reach out to Dr. Kathreen Devoid directly.

## 2020-09-15 ENCOUNTER — Ambulatory Visit: Admit: 2020-09-15 | Discharge: 2020-09-15 | Payer: No Typology Code available for payment source

## 2020-09-15 ENCOUNTER — Encounter: Admit: 2020-09-15 | Discharge: 2020-09-15 | Payer: No Typology Code available for payment source

## 2020-09-15 DIAGNOSIS — Z8659 Personal history of other mental and behavioral disorders: Secondary | ICD-10-CM

## 2020-09-15 DIAGNOSIS — E039 Hypothyroidism, unspecified: Secondary | ICD-10-CM

## 2020-09-15 DIAGNOSIS — G4730 Sleep apnea, unspecified: Secondary | ICD-10-CM

## 2020-09-15 DIAGNOSIS — E7800 Pure hypercholesterolemia, unspecified: Secondary | ICD-10-CM

## 2020-09-15 DIAGNOSIS — F32A Depressed: Secondary | ICD-10-CM

## 2020-09-15 DIAGNOSIS — Q615 Medullary cystic kidney: Secondary | ICD-10-CM

## 2020-09-15 DIAGNOSIS — R000 Tachycardia, unspecified: Secondary | ICD-10-CM

## 2020-09-15 DIAGNOSIS — F99 Mental disorder, not otherwise specified: Secondary | ICD-10-CM

## 2020-09-15 DIAGNOSIS — N390 Urinary tract infection, site not specified: Secondary | ICD-10-CM

## 2020-09-15 DIAGNOSIS — F332 Major depressive disorder, recurrent severe without psychotic features: Secondary | ICD-10-CM

## 2020-09-15 DIAGNOSIS — F4310 Post-traumatic stress disorder, unspecified: Secondary | ICD-10-CM

## 2020-09-15 DIAGNOSIS — E785 Hyperlipidemia, unspecified: Secondary | ICD-10-CM

## 2020-09-15 DIAGNOSIS — I499 Cardiac arrhythmia, unspecified: Secondary | ICD-10-CM

## 2020-09-15 MED FILL — SPRAVATO 56 MG (28 MG X 2) NA SPRY: 56 mg (28 mg x 2) | NASAL | 14 days supply | Qty: 2 | Fill #1 | Status: AC

## 2020-09-16 ENCOUNTER — Encounter: Admit: 2020-09-16 | Discharge: 2020-09-16 | Payer: No Typology Code available for payment source

## 2020-09-16 MED ORDER — SPRAVATO 56 MG (28 MG X 2) NA SPRY
56 mg | NASAL | 5 refills | 7.00000 days | Status: AC
Start: 2020-09-16 — End: ?
  Filled 2020-09-17: qty 2, 4d supply, fill #1

## 2020-09-16 NOTE — Telephone Encounter
Spoke with pt about continuing Spravato treatment. Pt agreed she wanted to continue.

## 2020-09-17 ENCOUNTER — Encounter: Admit: 2020-09-17 | Discharge: 2020-09-17 | Payer: No Typology Code available for payment source

## 2020-09-17 ENCOUNTER — Ambulatory Visit: Admit: 2020-09-17 | Discharge: 2020-09-17 | Payer: No Typology Code available for payment source

## 2020-09-17 DIAGNOSIS — E039 Hypothyroidism, unspecified: Secondary | ICD-10-CM

## 2020-09-17 DIAGNOSIS — Z8659 Personal history of other mental and behavioral disorders: Secondary | ICD-10-CM

## 2020-09-17 DIAGNOSIS — F32A Depressed: Secondary | ICD-10-CM

## 2020-09-17 DIAGNOSIS — Q615 Medullary cystic kidney: Secondary | ICD-10-CM

## 2020-09-17 DIAGNOSIS — E785 Hyperlipidemia, unspecified: Secondary | ICD-10-CM

## 2020-09-17 DIAGNOSIS — F332 Major depressive disorder, recurrent severe without psychotic features: Secondary | ICD-10-CM

## 2020-09-17 DIAGNOSIS — E7800 Pure hypercholesterolemia, unspecified: Secondary | ICD-10-CM

## 2020-09-17 DIAGNOSIS — I499 Cardiac arrhythmia, unspecified: Secondary | ICD-10-CM

## 2020-09-17 DIAGNOSIS — G4730 Sleep apnea, unspecified: Secondary | ICD-10-CM

## 2020-09-17 DIAGNOSIS — N390 Urinary tract infection, site not specified: Secondary | ICD-10-CM

## 2020-09-17 DIAGNOSIS — F99 Mental disorder, not otherwise specified: Secondary | ICD-10-CM

## 2020-09-17 DIAGNOSIS — R000 Tachycardia, unspecified: Secondary | ICD-10-CM

## 2020-09-17 DIAGNOSIS — F4310 Post-traumatic stress disorder, unspecified: Secondary | ICD-10-CM

## 2020-09-17 MED ORDER — FLUOXETINE 20 MG PO CAP
60 mg | ORAL_CAPSULE | Freq: Every day | ORAL | 1 refills | Status: AC
Start: 2020-09-17 — End: ?

## 2020-09-17 NOTE — Progress Notes
Spravato Treatment Nursing Assessment    Intra-Treatment Assessment:   Spravato (Esketamine) Blood Pressure Checks:  Vitals:    09/17/20 1100 09/17/20 1157 09/17/20 1313   BP: 131/89 (!) 131/95 116/70   BP Source: Arm, Right Upper Arm, Right Upper Arm, Right Upper   Pulse: 66 67 63      Spravato (Esketamine) Complications or Adverse Effects: None      Post-Treatment and Discharge Assessment:   Mood "I just feel different."   Presence of SI/HI and AVH/Paranoia? No   Any Physical Complaints: No    Patient identified as a high fall risk during pre-visit planning due to the potential for impaired balance during the administration and side effects of Spravato treatment. Although the patient will remain in the clinic and monitored under the supervision of myself and a RN for the entirety of the session, patient will be flagged as a high fall risk.     A high fall risk band was placed on the patient if they needed to leave the room during treatment and the room was marked with a yellow triangle.   The patient was kept in the lowest and safest position possible during the time he/she was present in the clinic.    The patient did not ambulate on their own while in the clinic aside from entering or exiting the clinic when deemed safe to go home. Any patient ambulation during the 2 hours procedure window was done with at least 1 nurse or physician assisting the patient.

## 2020-09-18 ENCOUNTER — Encounter: Admit: 2020-09-18 | Discharge: 2020-09-18 | Payer: No Typology Code available for payment source

## 2020-09-18 ENCOUNTER — Ambulatory Visit: Admit: 2020-09-18 | Discharge: 2020-09-19 | Payer: No Typology Code available for payment source

## 2020-09-18 DIAGNOSIS — J309 Allergic rhinitis, unspecified: Secondary | ICD-10-CM

## 2020-09-19 ENCOUNTER — Encounter: Admit: 2020-09-19 | Discharge: 2020-09-19 | Payer: No Typology Code available for payment source

## 2020-09-19 ENCOUNTER — Ambulatory Visit: Admit: 2020-09-19 | Discharge: 2020-09-20 | Payer: No Typology Code available for payment source

## 2020-09-19 DIAGNOSIS — J309 Allergic rhinitis, unspecified: Secondary | ICD-10-CM

## 2020-09-22 ENCOUNTER — Encounter: Admit: 2020-09-22 | Discharge: 2020-09-22 | Payer: No Typology Code available for payment source

## 2020-09-22 ENCOUNTER — Ambulatory Visit: Admit: 2020-09-22 | Discharge: 2020-09-22 | Payer: No Typology Code available for payment source

## 2020-09-22 DIAGNOSIS — F332 Major depressive disorder, recurrent severe without psychotic features: Secondary | ICD-10-CM

## 2020-09-22 MED FILL — SPRAVATO 56 MG (28 MG X 2) NA SPRY: 56 mg (28 mg x 2) | NASAL | 4 days supply | Qty: 2 | Fill #2 | Status: CP

## 2020-09-23 ENCOUNTER — Encounter: Admit: 2020-09-23 | Discharge: 2020-09-23 | Payer: No Typology Code available for payment source

## 2020-09-24 ENCOUNTER — Ambulatory Visit: Admit: 2020-09-24 | Discharge: 2020-09-24 | Payer: No Typology Code available for payment source

## 2020-09-24 ENCOUNTER — Encounter: Admit: 2020-09-24 | Discharge: 2020-09-24 | Payer: No Typology Code available for payment source

## 2020-09-24 DIAGNOSIS — F332 Major depressive disorder, recurrent severe without psychotic features: Secondary | ICD-10-CM

## 2020-09-24 MED FILL — SPRAVATO 56 MG (28 MG X 2) NA SPRY: 56 mg (28 mg x 2) | NASAL | 4 days supply | Qty: 2 | Fill #3 | Status: AC

## 2020-09-24 NOTE — Progress Notes
Spravato (Esketamine) Note    Date of Service: 09/24/2020    Subjective:      Holly Maldonado is a 60 y.o. female  [id number]    Total of 120 minutes were spent on the same day of the visit including preparing to see the patient, obtaining history, performing a medically appropriate examination, counseling and educating the patient, observing and monitoring Spravato Treatment, documenting clinical information in the electronic health record and care coordination.     History of Present Illness   Holly Maldonado is a 60 y.o. female  w/ a pmh significant for MDD, PTSD, and bulimia. Who is seen today for Spravato Administration    Patient Information:  ? Spravato Charity fundraiser) Rx Verificaiton: [name] April 18, 1961 09/24/2020 Yes   ? Spravato (Esketamine) Treatment #: 4  ? Spravato (Esketamine) Dose: 56mg        Pre-Treatment Assessment:  ? Last Food: > 2 Hours? Yes   ? Last Drink: > 30 Minutes? Yes   ? Last Nasal Spray: > 2 Hours? Yes   ? Spravato (Esketamine) Complications or Adverse Effects since last visit?: Lethargy and hearing radio announcers when it's time for bed    Since her last visit, reports that she is feeling a little bit less disconnected. She reports that she has heard the voice of a radio announcer when she is going to bed. Otherwise, denies any significant SE from last treatment. She asks about strategies to improve her ability to relax and open her mind during treatments. She reports that this is something she has always struggled with, in most contexts. We discussed practicing mindfulness in other settings so that it might be easier to translate to Spravato treatments as well. Discussed YouTube videos or apps such as Calm that can help facilitate this practice at home. Pt appreciative of counseling.     ? MADRS Score: 45    ? PHQ-9  PHQ-9 Score: 27 (09/24/2020 11:12 AM)    Over the past 2 weeks, how often have you been bothered by any of the following problems?  Little interest or pleasure in doing things: (!) Nearly Every Day  Feeling down, depressed or hopeless: (!) Nearly Every Day  PHQ-2 Score: 6  If the score is > / = 3, proceed with the questions below:  Trouble falling asleep, staying asleep, or sleeping too much: Nearly Every Day  Feeling tired or having little energy: Nearly Every Day  Poor appetite or overeating: Nearly Every Day  Feeling bad about yourself - or that you're a failure or have let  yourself or your family down: Nearly Every Day  Trouble concentrating on things, such as reading the newspaper or watching television: Nearly Every Day  Moving or speaking so slowly that other people could have noticed. Or, the opposite - being so fidgety or restless that you have been moving around a lot more than usual: Nearly Every Day  Thoughts that you would be better off dead or of hurting yourself in some way: Nearly Every Day  PHQ-9 Score: 27    ? HAM-D  Hamilton Rating Scale for Depression  Depressed Mood: Patient reports virtually only these feeling states in his/her spontaneous verbal and non-verbal communication  Feelings of Guilt: Present illness is a punishment. Delusions of guilt  Suicide: Feels life is not worth living  Insomnia: Early in the Night: Complains of nightly difficulty falling asleep  Insomnia: Middle of the Night: Waking during the night - any getting out  of bed rates 2 (except for purposes of voiding)  Insomnia: Early Hours of the Morning: Unable to fall asleep again if he/she gets out of bed  Work and Activities: Stopped working because of present illness. Rate 4 if patient engages in no activities except routine chores, or if patient fails to perform routine chores unassisted  Retardation: Normal speech and thought  Agitation: Fidgetiness  Anxiety Psychic: Apprehensive attitude apparent in face or speech  Anxiety Somatic: Moderate  Somatic Symptoms Gastro-Intestinal: Loss of appetite but eating without staff encouragement. Heavy feelings in abdomen  General Somatic Symptoms: Heaviness in limbs, back or head. Backaches, headaches, muscle aches. Loss of energy and fatigability  Genital Symptoms: Absent  Hypochondriasis: Frequent complaints, requests for help, etc.  Loss of Weight: Probable weight loss associated with present illness  Insight: Acknowledges illness but attributes cause to bad food, climate, overwork, virus, need for rest, etc.  HAM-D Total Score: 31      Intra-Treatment Assessment:  ? Spravato (Esketamine) Start Time: 11:01  ? Spravato (Esketamine) Blood Pressure Checks:  Vitals:    09/24/20 1115 09/24/20 1142 09/24/20 1306   BP: 123/78 132/78 115/76   BP Source: Arm, Right Upper Arm, Right Upper Arm, Right Upper   Pulse: 65 60 58     ? Spravato (Esketamine) Complications or Adverse Effects: Dissociation  ? Spravato (Esketamine) Recommendations For Next Administration: increase to 84mg  dose   ? Spravato (Esketamine) Stop Time: 13:01      Post-Treatment and Discharge Assessment:  ? Mood funky  ? Presence of SI/HI and AVH/Paranoia? No  ? Any Physical Complaints: Yes tinnitis (chronic)  ? Spravato REMS Patient Monitoring Form Submitted? Yes     Review of Systems   Constitutional: Negative for appetite change, chills, fever and unexpected weight change.   Gastrointestinal: Negative for nausea and vomiting.   Neurological: Negative for headaches.   Psychiatric/Behavioral: Positive for decreased concentration, dysphoric mood and sleep disturbance. Negative for hallucinations, self-injury and suicidal ideas. The patient is nervous/anxious. The patient is not hyperactive.        Objective:         ? aspirin EC 81 mg tablet Take 81 mg by mouth daily. Every other day.   ? AUVI-Q 0.3 mg/0.3 mL auto-injector Inject 0.3 mg (1 Pen) into thigh if needed for anaphylactic reaction. May repeat in 5-15 minutes if needed.   ? Cholecalciferol (Vitamin D3) 1,000 unit cap Take 5,000 Units by mouth.   ? clonazePAM (KLONOPIN) 0.5 mg tablet Take 2 mg by mouth at bedtime daily. ? clonazePAM (KLONOPIN) 2 mg tablet Take one tablet by mouth at bedtime daily.   ? docosahexaenoic acid/epa (FISH OIL PO) Take 3 capsules by mouth daily.   ? EPINEPHrine (EPIPEN 2-PAK) 1 mg/mL injection pen (2-Pack) Inject 0.3 mg (1 Pen) into thigh if needed for anaphylactic reaction. May repeat in 5-15 minutes if needed.   ? esketamine (SPRAVATO) 56 mg (28 mg x 2) nasal spray Insert four sprays into nose as directed twice weekly. Wait 5 minutes between use of each device.  Indications: additional treatment for major depressive disorder   ? estradioL (CLIMARA) 0.075 mg/24 hr patch Apply one patch to top of skin as directed twice weekly.   ? ezetimibe (ZETIA) 10 mg tablet Take one tablet by mouth daily.   ? flecainide (TAMBOCOR) 100 mg tablet Take one tablet by mouth twice daily. (Patient taking differently: Take 50 mg by mouth twice daily.)   ? FLUoxetine (PROZAC) 20 mg capsule  Take three capsules by mouth daily. Indications: bulimia, major depressive disorder, posttraumatic stress syndrome   ? ketotifen (ZADITOR) 0.025 % (0.035 %) ophthalmic solution Place one drop into or around eye(s) twice daily as needed.   ? levothyroxine (SYNTHROID) 100 mcg tablet Take one tablet by mouth daily 30 minutes before breakfast.   ? magnesium oxide (MAG-OX) 400 mg (241.3 mg magnesium) tablet Take 400 mg by mouth daily.   ? olopatadine (PATANASE) 0.6 % nasal spray Apply one spray to two sprays to each nostril as directed twice daily as needed.   ? rOPINIRole (REQUIP) 0.5 mg tablet Take 0.5 mg by mouth at bedtime daily.   ? rosuvastatin (CRESTOR) 10 mg tablet Take one tablet by mouth daily.   ? zinc sulfate 220 mg (50 mg elemental zinc) capsule Take 220 mg by mouth daily.       Vitals:    09/24/20 1115 09/24/20 1142 09/24/20 1306   BP: 123/78 132/78 115/76   BP Source: Arm, Right Upper Arm, Right Upper Arm, Right Upper   Pulse: 65 60 58     There is no height or weight on file to calculate BMI.     Physical Exam  Vitals reviewed. Constitutional:       General: She is not in acute distress.     Appearance: She is not ill-appearing.   HENT:      Head: Normocephalic and atraumatic.   Eyes:      Conjunctiva/sclera: Conjunctivae normal.   Pulmonary:      Effort: Pulmonary effort is normal. No respiratory distress.   Skin:     Findings: No erythema or rash.   Neurological:      Mental Status: She is alert and oriented to person, place, and time.         Mental Status Evaluation:    General/Constitutional: Appears stated age, in personal attire, good hygiene and grooming.   Eye Contact: Fair  Behavior: Appropriate and cooperative  Speech: regular rate and rhythm, normal tone and volume.   Mood: okay  Affect: neutral, restricted range, mood congruent  Thought Process: Linear, organized, easy to follow and understand.  Thought Content: Denies SI/HI.   Perception: Denies AVH. No evidence of paranoia, delusions, or illusions.   Associations: Intact  Insight/Judgment: fair/fair         Assessment and Plan:     Summary/Formulation:   THUYTIEN SCIORTINO is a 60 y.o. female with a history of MDD, PTSD, and bulimia who presents to [place] outpatient [facility] for Spravato Administration    Problem   Severe Episode of Recurrent Major Depressive Disorder, Without Psychotic Features (Hcc)    09/24/2020   Stable-Chronic  -Current symptoms: SIGECAPS (8/9)  -Denies HI and AVH. Passive SI w/o plan or intent  PHQ-9  PHQ-9 Score: 27 (09/24/2020 11:12 AM)    Over the past 2 weeks, how often have you been bothered by any of the following problems?  Little interest or pleasure in doing things: (!) Nearly Every Day  Feeling down, depressed or hopeless: (!) Nearly Every Day  PHQ-2 Score: 6  If the score is > / = 3, proceed with the questions below:  Trouble falling asleep, staying asleep, or sleeping too much: Nearly Every Day  Feeling tired or having little energy: Nearly Every Day  Poor appetite or overeating: Nearly Every Day  Feeling bad about yourself - or that you're a failure or have let  yourself or your family down: Nearly Every Day  Trouble concentrating on things, such as reading the newspaper or watching television: Nearly Every Day  Moving or speaking so slowly that other people could have noticed. Or, the opposite - being so fidgety or restless that you have been moving around a lot more than usual: Nearly Every Day  Thoughts that you would be better off dead or of hurting yourself in some way: Nearly Every Day  PHQ-9 Score: 27     Plan:  The following medications will be continued for the above symptoms:  -Continue Klonopin 2mg  PO QHS  -Continue Prozac 60mg  PO Daily  -Continue Spravato 56mg  IN 2x/week Induction, consider increasing dose to 84mg  next week              Post Discharge Instructions:  Do Not: Drive, Operate Heavy Machinery, or make major life decisions until the following day.   Contact the clinic with any side effects, questions, or concerns ([phone])    Dimple Casey, MD    Patient identified as a high fall risk during pre-visit planning due to the potential for impaired balance during the administration and side effects of Spravato treatment. Although the patient will remain in the clinic and monitored under the supervision of myself and a RN for the entirety of the session, patient will be flagged as a high fall risk.     A high fall risk band was placed on the patient if they needed to leave the room during treatment and the room was marked with a yellow triangle.   The patient was kept in the lowest and safest position possible during the time he/she was present in the clinic.    The patient did not ambulate on their own while in the clinic aside from entering or exiting the clinic when deemed safe to go home. Any patient ambulation during the 2 hours procedure window was done with at least 1 nurse or physician assisting the patient.    This note was composed with assistance of Office manager.  There may be transcriptional errors. If you have questions regarding the note please reach out to Dr. Catha Nottingham directly.

## 2020-09-25 ENCOUNTER — Encounter: Admit: 2020-09-25 | Discharge: 2020-09-25 | Payer: No Typology Code available for payment source

## 2020-09-25 ENCOUNTER — Ambulatory Visit: Admit: 2020-09-25 | Discharge: 2020-09-26 | Payer: No Typology Code available for payment source

## 2020-09-25 DIAGNOSIS — J309 Allergic rhinitis, unspecified: Secondary | ICD-10-CM

## 2020-09-26 ENCOUNTER — Encounter: Admit: 2020-09-26 | Discharge: 2020-09-26 | Payer: No Typology Code available for payment source

## 2020-09-27 ENCOUNTER — Encounter: Admit: 2020-09-27 | Discharge: 2020-09-27 | Payer: No Typology Code available for payment source

## 2020-09-28 NOTE — Patient Instructions
It was nice to see you in clinic today.  If questions arise, you can reach the clinic by calling [phone].    Actions from today's visit:  Post Discharge Instructions:  Do Not: Drive, Operate Heavy Machinery, or make major life decisions until the following day.   Contact the clinic with any side effects, questions, or concerns ([phone])    Dr. Asiah Browder    Please direct medication refill requests to your pharmacy.    In the event of a safety concern or suicidal thoughts, call 911 or go to the nearest emergency room. National Suicide Prevention Lifeline 800-273-8255 (Talk). Crisis Text Hotline (text 741741).     The [employer] phone line is a resource for talk support in non-emergency situations (1-866-WARMEAR).

## 2020-09-28 NOTE — Progress Notes
Spravato (Esketamine) Note    Date of Service: 09/29/2020    Subjective:      Holly Maldonado is a 60 y.o. female  [id number]    Total of 120 minutes were spent on the same day of the visit including preparing to see the patient, obtaining history, performing a medically appropriate examination, counseling and educating the patient, observing and monitoring Spravato Treatment, documenting clinical information in the electronic health record and care coordination.     History of Present Illness   Holly Maldonado is a 60 y.o. female  w/ a pmh significant for MDD, PTSD, and bulimia. Who is seen today for Spravato Administration    Patient Information:  ? Spravato Charity fundraiser) Rx Verificaiton: Holly Maldonado [birth year] 09/29/2020 Yes   ? Spravato (Esketamine) Treatment #: 5  ? Spravato (Esketamine) Dose: 56mg        Pre-Treatment Assessment:  ? Last Food: > 2 Hours? Yes   ? Last Drink: > 30 Minutes? Yes   ? Last Nasal Spray: > 2 Hours? Yes   ? Spravato (Esketamine) Complications or Adverse Effects since last visit?: Lethargy    Since her last visit, the pt reports continued depressed mood, anhedonia, sleep disturbances, appetite changes, poor concentration, anergy, PMA, and feelings of hopelessness. She notes thoughts of not wanting to live this way and passive SI w/o plan or intent. She denies current SI/HI and AVH. She notes that she has had a few good days after her procedures and notes that the sessions are helping with her trauma symptoms.     ? MADRS Score: 50    ? PHQ-9  PHQ-9 Score: 26 (09/29/2020 11:14 AM)    Over the past 2 weeks, how often have you been bothered by any of the following problems?  Little interest or pleasure in doing things: (!) Nearly Every Day  Feeling down, depressed or hopeless: (!) Nearly Every Day  PHQ-2 Score: 6  If the score is > / = 3, proceed with the questions below:  Trouble falling asleep, staying asleep, or sleeping too much: Nearly Every Day  Feeling tired or having little energy: Nearly Every Day  Poor appetite or overeating: Nearly Every Day  Feeling bad about yourself - or that you're a failure or have let  yourself or your family down: Nearly Every Day  Trouble concentrating on things, such as reading the newspaper or watching television: Nearly Every Day  Moving or speaking so slowly that other people could have noticed. Or, the opposite - being so fidgety or restless that you have been moving around a lot more than usual: More Than Half the Days  Thoughts that you would be better off dead or of hurting yourself in some way: Nearly Every Day  PHQ-9 Score: 26    ? HAM-D  Hamilton Rating Scale for Depression  Depressed Mood: Patient reports virtually only these feeling states in his/her spontaneous verbal and non-verbal communication  Feelings of Guilt: Present illness is a punishment. Delusions of guilt  Suicide: Wishes he/she were dead or any thoughts of possible death to self  Insomnia: Early in the Night: Complains of nightly difficulty falling asleep  Insomnia: Middle of the Night: Waking during the night - any getting out of bed rates 2 (except for purposes of voiding)  Insomnia: Early Hours of the Morning: Unable to fall asleep again if he/she gets out of bed  Work and Activities: Stopped working because of present illness. Rate 4 if patient  engages in no activities except routine chores, or if patient fails to perform routine chores unassisted  Retardation: Slight retardation during the interview  Agitation: Fidgetiness  Anxiety Psychic: Apprehensive attitude apparent in face or speech  Anxiety Somatic: Moderate  Somatic Symptoms Gastro-Intestinal: Loss of appetite but eating without staff encouragement. Heavy feelings in abdomen  General Somatic Symptoms: Any clear-cut symptom rates 2  Genital Symptoms: Absent  Hypochondriasis: Frequent complaints, requests for help, etc.  Loss of Weight: Probable weight loss associated with present illness  Insight: Acknowledges illness but attributes cause to bad food, climate, overwork, virus, need for rest, etc.  HAM-D Total Score: 34      Intra-Treatment Assessment:  ? Spravato (Esketamine) Start Time: 11:07  ? Spravato (Esketamine) Blood Pressure Checks:  Vitals:    09/29/20 1107 09/29/20 1147 09/29/20 1307   BP: 127/86 (!) 147/71 122/82   BP Source: Arm, Right Upper Arm, Right Upper Arm, Right Upper   Pulse: 71 81 65     ? Spravato (Esketamine) Complications or Adverse Effects: Dissociation  ? Spravato (Esketamine) Recommendations For Next Administration: increase to 84mg  dose   ? Spravato (Esketamine) Stop Time: 13:07      Post-Treatment and Discharge Assessment:  ? Mood sad/down  ? Presence of SI/HI and AVH/Paranoia? No  ? Any Physical Complaints: Yes tinnitis (chronic)  ? Spravato REMS Patient Monitoring Form Submitted? Yes     Review of Systems   Constitutional: Positive for appetite change and fatigue. Negative for chills, fever and unexpected weight change.   Respiratory: Negative for shortness of breath.    Cardiovascular: Negative for chest pain and palpitations.   Gastrointestinal: Negative for abdominal pain, constipation, diarrhea, nausea and vomiting.   Musculoskeletal: Negative for arthralgias and myalgias.   Neurological: Negative for dizziness, light-headedness and headaches.   Psychiatric/Behavioral: Positive for decreased concentration, dysphoric mood, sleep disturbance and suicidal ideas (Passive SI w/o plan or intent. ). Negative for hallucinations and self-injury. The patient is nervous/anxious. The patient is not hyperactive.        Objective:         ? aspirin EC 81 mg tablet Take 81 mg by mouth daily. Every other day.   ? AUVI-Q 0.3 mg/0.3 mL auto-injector Inject 0.3 mg (1 Pen) into thigh if needed for anaphylactic reaction. May repeat in 5-15 minutes if needed.   ? Cholecalciferol (Vitamin D3) 1,000 unit cap Take 5,000 Units by mouth.   ? clonazePAM (KLONOPIN) 0.5 mg tablet Take 2 mg by mouth at bedtime daily.   ? clonazePAM (KLONOPIN) 2 mg tablet Take one tablet by mouth at bedtime daily.   ? docosahexaenoic acid/epa (FISH OIL PO) Take 3 capsules by mouth daily.   ? EPINEPHrine (EPIPEN 2-PAK) 1 mg/mL injection pen (2-Pack) Inject 0.3 mg (1 Pen) into thigh if needed for anaphylactic reaction. May repeat in 5-15 minutes if needed.   ? esketamine (SPRAVATO) 56 mg (28 mg x 2) nasal spray Insert four sprays into nose as directed twice weekly. Wait 5 minutes between use of each device.  Indications: additional treatment for major depressive disorder   ? esketamine (SPRAVATO) 84 mg (28 mg x 3) nasal spray Insert six sprays into nose as directed twice weekly. Wait 5 minutes between use of each device.  Indications: additional treatment for major depressive disorder   ? estradioL (CLIMARA) 0.075 mg/24 hr patch Apply one patch to top of skin as directed twice weekly.   ? ezetimibe (ZETIA) 10 mg tablet Take one tablet by  mouth daily.   ? flecainide (TAMBOCOR) 100 mg tablet Take one tablet by mouth twice daily. (Patient taking differently: Take 50 mg by mouth twice daily.)   ? FLUoxetine (PROZAC) 20 mg capsule Take three capsules by mouth daily. Indications: bulimia, major depressive disorder, posttraumatic stress syndrome   ? ketotifen (ZADITOR) 0.025 % (0.035 %) ophthalmic solution Place one drop into or around eye(s) twice daily as needed.   ? levothyroxine (SYNTHROID) 100 mcg tablet Take one tablet by mouth daily 30 minutes before breakfast.   ? magnesium oxide (MAG-OX) 400 mg (241.3 mg magnesium) tablet Take 400 mg by mouth daily.   ? olopatadine (PATANASE) 0.6 % nasal spray Apply one spray to two sprays to each nostril as directed twice daily as needed.   ? rOPINIRole (REQUIP) 0.5 mg tablet Take 0.5 mg by mouth at bedtime daily.   ? rosuvastatin (CRESTOR) 10 mg tablet Take one tablet by mouth daily.   ? zinc sulfate 220 mg (50 mg elemental zinc) capsule Take 220 mg by mouth daily.       Vitals:    09/29/20 1107 09/29/20 1147 09/29/20 1307   BP: 127/86 (!) 147/71 122/82   BP Source: Arm, Right Upper Arm, Right Upper Arm, Right Upper   Pulse: 71 81 65     There is no height or weight on file to calculate BMI.     Physical Exam  Vitals reviewed.   Constitutional:       General: She is not in acute distress.     Appearance: She is not ill-appearing.   HENT:      Head: Normocephalic and atraumatic.   Pulmonary:      Effort: Pulmonary effort is normal. No respiratory distress.   Neurological:      Mental Status: She is alert.      Gait: Gait normal.         Mental Status Evaluation:    General/Constitutional: Appears stated age, in personal attire, good hygiene and grooming.   Eye Contact: Fair  Behavior: Appropriate and cooperative  Speech: regular rate and rhythm, normal tone and volume.   Mood: okay  Affect: Content, mood congruent  Thought Process: Linear, organized, easy to follow and understand.  Thought Content: Denies current SI/HI.   Perception: Denies AVH. No evidence of paranoia, delusions, or illusions.   Associations: Intact  Insight/Judgment: Good/Good         Assessment and Plan:     Summary/Formulation:   Holly Maldonado is a 60 y.o. female with a history of MDD, PTSD, and bulimia who presents to [facility] outpatient psychiatry for Spravato Administration    Problem   Severe Episode of Recurrent Major Depressive Disorder, Without Psychotic Features (Hcc)    09/29/2020   Stable-Chronic  -Current symptoms: SIGECAPS (9/9)  -Denies HI and AVH. Passive SI w/o plan or intent  PHQ-9  PHQ-9 Score: 26 (09/29/2020 11:14 AM)    Over the past 2 weeks, how often have you been bothered by any of the following problems?  Little interest or pleasure in doing things: (!) Nearly Every Day  Feeling down, depressed or hopeless: (!) Nearly Every Day  PHQ-2 Score: 6  If the score is > / = 3, proceed with the questions below:  Trouble falling asleep, staying asleep, or sleeping too much: Nearly Every Day  Feeling tired or having little energy: Nearly Every Day  Poor appetite or overeating: Nearly Every Day  Feeling bad about yourself - or that you're  a failure or have let  yourself or your family down: Nearly Every Day  Trouble concentrating on things, such as reading the newspaper or watching television: Nearly Every Day  Moving or speaking so slowly that other people could have noticed. Or, the opposite - being so fidgety or restless that you have been moving around a lot more than usual: More Than Half the Days  Thoughts that you would be better off dead or of hurting yourself in some way: Nearly Every Day  PHQ-9 Score: 26     Plan:  The following medications will be continued for the above symptoms:  -Continue Klonopin 2mg  PO QHS  -Continue Prozac 60mg  PO Daily  -Continue Spravato 56mg  IN 2x/week Induction              Post Discharge Instructions:  Do Not: Drive, Operate Heavy Machinery, or make major life decisions until the following day.   Contact the clinic with any side effects, questions, or concerns ([phone])    Rae Mar, MD    Patient identified as a high fall risk during pre-visit planning due to the potential for impaired balance during the administration and side effects of Spravato treatment. Although the patient will remain in the clinic and monitored under the supervision of myself and a RN for the entirety of the session, patient will be flagged as a high fall risk.     A high fall risk band was placed on the patient if they needed to leave the room during treatment and the room was marked with a yellow triangle.   The patient was kept in the lowest and safest position possible during the time he/she was present in the clinic.    The patient did not ambulate on their own while in the clinic aside from entering or exiting the clinic when deemed safe to go home. Any patient ambulation during the 2 hours procedure window was done with at least 1 nurse or physician assisting the patient.    This note was composed with assistance of Office manager.  There may be transcriptional errors. If you have questions regarding the note please reach out to Rae Mar, MD directly.

## 2020-09-28 NOTE — Patient Instructions
It was nice to see you in clinic today.  If questions arise, you can reach the clinic by calling [phone].    Actions from today's visit:  Post Discharge Instructions:  Do Not: Drive, Operate Heavy Machinery, or make major life decisions until the following day.   Contact the clinic with any side effects, questions, or concerns ([phone])    Dr. Bennie Chirico    Please direct medication refill requests to your pharmacy.    In the event of a safety concern or suicidal thoughts, call 911 or go to the nearest emergency room. National Suicide Prevention Lifeline 800-273-8255 (Talk). Crisis Text Hotline (text 741741).     The [facility] phone line is a resource for talk support in non-emergency situations (1-866-WARMEAR).

## 2020-09-29 ENCOUNTER — Encounter: Admit: 2020-09-29 | Discharge: 2020-09-29 | Payer: No Typology Code available for payment source

## 2020-09-29 ENCOUNTER — Ambulatory Visit: Admit: 2020-09-29 | Discharge: 2020-09-29 | Payer: No Typology Code available for payment source

## 2020-09-29 DIAGNOSIS — E039 Hypothyroidism, unspecified: Secondary | ICD-10-CM

## 2020-09-29 DIAGNOSIS — I499 Cardiac arrhythmia, unspecified: Secondary | ICD-10-CM

## 2020-09-29 DIAGNOSIS — E785 Hyperlipidemia, unspecified: Secondary | ICD-10-CM

## 2020-09-29 DIAGNOSIS — G4730 Sleep apnea, unspecified: Secondary | ICD-10-CM

## 2020-09-29 DIAGNOSIS — R000 Tachycardia, unspecified: Secondary | ICD-10-CM

## 2020-09-29 DIAGNOSIS — F4310 Post-traumatic stress disorder, unspecified: Secondary | ICD-10-CM

## 2020-09-29 DIAGNOSIS — Z8659 Personal history of other mental and behavioral disorders: Secondary | ICD-10-CM

## 2020-09-29 DIAGNOSIS — E7800 Pure hypercholesterolemia, unspecified: Secondary | ICD-10-CM

## 2020-09-29 DIAGNOSIS — F99 Mental disorder, not otherwise specified: Secondary | ICD-10-CM

## 2020-09-29 DIAGNOSIS — F32A Depressed: Secondary | ICD-10-CM

## 2020-09-29 DIAGNOSIS — F332 Major depressive disorder, recurrent severe without psychotic features: Secondary | ICD-10-CM

## 2020-09-29 DIAGNOSIS — N390 Urinary tract infection, site not specified: Secondary | ICD-10-CM

## 2020-09-29 DIAGNOSIS — Q615 Medullary cystic kidney: Secondary | ICD-10-CM

## 2020-09-29 MED ORDER — SPRAVATO 84 MG (28 MG X 3) NA SPRY
84 mg | NASAL | 3 refills | Status: CN
Start: 2020-09-29 — End: ?

## 2020-09-29 MED FILL — SPRAVATO 56 MG (28 MG X 2) NA SPRY: 56 mg (28 mg x 2) | NASAL | 4 days supply | Qty: 2 | Fill #4 | Status: CP

## 2020-09-29 NOTE — Progress Notes
The Prior Authorization for Spravato has been submitted for [employer] via Cover My Meds.  Will continue to follow.    [facility] Patient Advocate  [phone]

## 2020-09-29 NOTE — Progress Notes
Spravato Treatment Nursing Assessment    Intra-Treatment Assessment:   Spravato (Esketamine) Blood Pressure Checks:  Vitals:    09/29/20 1107 09/29/20 1147 09/29/20 1307   BP: 127/86 (!) 147/71 122/82   BP Source: Arm, Right Upper Arm, Right Upper Arm, Right Upper   Pulse: 71 81 65      Spravato (Esketamine) Complications or Adverse Effects: Dissociation and Dizziness and Vertigo      Post-Treatment and Discharge Assessment:   Mood "sad/down -what is her purpose in life, feeling like a burden"   Presence of SI/HI and AVH/Paranoia? No   Any Physical Complaints: No    Patient identified as a high fall risk during pre-visit planning due to the potential for impaired balance during the administration and side effects of Spravato treatment. Although the patient will remain in the clinic and monitored under the supervision of myself and a RN for the entirety of the session, patient will be flagged as a high fall risk.     A high fall risk band was placed on the patient if they needed to leave the room during treatment and the room was marked with a yellow triangle.   The patient was kept in the lowest and safest position possible during the time he/she was present in the clinic.    The patient did not ambulate on their own while in the clinic aside from entering or exiting the clinic when deemed safe to go home. Any patient ambulation during the 2 hours procedure window was done with at least 1 nurse or physician assisting the patient.

## 2020-09-30 ENCOUNTER — Encounter: Admit: 2020-09-30 | Discharge: 2020-09-30 | Payer: No Typology Code available for payment source

## 2020-09-30 NOTE — Progress Notes
The Prior Authorization for Spravato 84mg  was approved for Holly Maldonado from 09/29/2020 to 04/01/2021.  The copay is $0.  The PA authorization number is [id number].    The medication will be set for pick up at our [facility] for a clinic staff member to pick up before the patient comes to the clinic for administration.    [place] Patient Advocate  [phone]

## 2020-10-01 ENCOUNTER — Encounter: Admit: 2020-10-01 | Discharge: 2020-10-01 | Payer: No Typology Code available for payment source

## 2020-10-01 ENCOUNTER — Ambulatory Visit: Admit: 2020-10-01 | Discharge: 2020-10-02 | Payer: No Typology Code available for payment source

## 2020-10-01 DIAGNOSIS — G4730 Sleep apnea, unspecified: Secondary | ICD-10-CM

## 2020-10-01 DIAGNOSIS — Q615 Medullary cystic kidney: Secondary | ICD-10-CM

## 2020-10-01 DIAGNOSIS — F332 Major depressive disorder, recurrent severe without psychotic features: Secondary | ICD-10-CM

## 2020-10-01 DIAGNOSIS — I499 Cardiac arrhythmia, unspecified: Secondary | ICD-10-CM

## 2020-10-01 DIAGNOSIS — F4312 Post-traumatic stress disorder, chronic: Secondary | ICD-10-CM

## 2020-10-01 DIAGNOSIS — Z8659 Personal history of other mental and behavioral disorders: Secondary | ICD-10-CM

## 2020-10-01 DIAGNOSIS — F32A Depressed: Secondary | ICD-10-CM

## 2020-10-01 DIAGNOSIS — E7800 Pure hypercholesterolemia, unspecified: Secondary | ICD-10-CM

## 2020-10-01 DIAGNOSIS — R000 Tachycardia, unspecified: Secondary | ICD-10-CM

## 2020-10-01 DIAGNOSIS — F99 Mental disorder, not otherwise specified: Secondary | ICD-10-CM

## 2020-10-01 DIAGNOSIS — F4310 Post-traumatic stress disorder, unspecified: Secondary | ICD-10-CM

## 2020-10-01 DIAGNOSIS — E785 Hyperlipidemia, unspecified: Secondary | ICD-10-CM

## 2020-10-01 DIAGNOSIS — N390 Urinary tract infection, site not specified: Secondary | ICD-10-CM

## 2020-10-01 DIAGNOSIS — E039 Hypothyroidism, unspecified: Secondary | ICD-10-CM

## 2020-10-01 MED ORDER — CYPROHEPTADINE 4 MG PO TAB
4 mg | ORAL_TABLET | Freq: Every evening | ORAL | 0 refills | Status: AC
Start: 2020-10-01 — End: ?

## 2020-10-01 MED FILL — SPRAVATO 84 MG (28 MG X 3) NA SPRY: 84 mg (28 mg x 3) | NASAL | 4 days supply | Qty: 3 | Fill #1 | Status: CP

## 2020-10-02 ENCOUNTER — Encounter: Admit: 2020-10-02 | Discharge: 2020-10-02 | Payer: No Typology Code available for payment source

## 2020-10-02 ENCOUNTER — Ambulatory Visit: Admit: 2020-10-02 | Discharge: 2020-10-03 | Payer: No Typology Code available for payment source

## 2020-10-02 DIAGNOSIS — J309 Allergic rhinitis, unspecified: Secondary | ICD-10-CM

## 2020-10-06 NOTE — Progress Notes
Spravato (Esketamine) Note    Date of Service: 10/10/2020    Subjective:      Holly Maldonado is a 60 y.o. female  [id number]    Total of 120 minutes were spent on the same day of the visit including preparing to see the patient, obtaining history, performing a medically appropriate examination, counseling and educating the patient, observing and monitoring Spravato Treatment, documenting clinical information in the electronic health record and care coordination.     History of Present Illness   Holly Maldonado is a 60 y.o. female  w/ a pmh significant for MDD, PTSD, and bulimia. Who is seen today for Spravato Administration    Patient Information:  ? Spravato Charity fundraiser) Rx Verificaiton: Holly Maldonado [birth year] 10/10/2020 Yes   ? Spravato (Esketamine) Treatment #: [id number]  ? Spravato (Esketamine) Dose: 84mg        Pre-Treatment Assessment:  ? Last Food: > 2 Hours? Yes   ? Last Drink: > 30 Minutes? Yes   ? Last Nasal Spray: > 2 Hours? Yes   ? Spravato (Esketamine) Complications or Adverse Effects since last visit?: Lethargy and Detachment     Since her last visit, the pt reports continued depressed mood, anhedonia, insomnia, binging and purging appetite/eating behaviors, feelings of guilt and worthlessness. She notes poor concentration and anergy. She reports PMR and PMA w/ associated hypervigilance. She notes passive SI w/o plan or intent. Denies HI and AVH. She also reports some chronic numbness and detachment.     ? MADRS Score: 50    ? PHQ-9  PHQ-9 Score: 27 (10/10/2020 10:59 AM)    Over the past 2 weeks, how often have you been bothered by any of the following problems?  Little interest or pleasure in doing things: (!) Nearly Every Day  Feeling down, depressed or hopeless: (!) Nearly Every Day  PHQ-2 Score: 6  If the score is > / = 3, proceed with the questions below:  Trouble falling asleep, staying asleep, or sleeping too much: Nearly Every Day  Feeling tired or having little energy: Nearly Every Day  Poor appetite or overeating: Nearly Every Day  Feeling bad about yourself - or that you're a failure or have let  yourself or your family down: Nearly Every Day  Trouble concentrating on things, such as reading the newspaper or watching television: Nearly Every Day  Moving or speaking so slowly that other people could have noticed. Or, the opposite - being so fidgety or restless that you have been moving around a lot more than usual: Nearly Every Day  Thoughts that you would be better off dead or of hurting yourself in some way: Nearly Every Day  PHQ-9 Score: 27    ? HAM-D  Hamilton Rating Scale for Depression  Depressed Mood: Patient reports virtually only these feeling states in his/her spontaneous verbal and non-verbal communication  Feelings of Guilt: Present illness is a punishment. Delusions of guilt  Suicide: Wishes he/she were dead or any thoughts of possible death to self  Insomnia: Early in the Night: Complains of nightly difficulty falling asleep  Insomnia: Middle of the Night: Waking during the night - any getting out of bed rates 2 (except for purposes of voiding)  Insomnia: Early Hours of the Morning: Unable to fall asleep again if he/she gets out of bed  Work and Activities: Stopped working because of present illness. Rate 4 if patient engages in no activities except routine chores, or if patient fails to perform  routine chores unassisted  Retardation: Slight retardation during the interview  Agitation: Fidgetiness  Anxiety Psychic: Apprehensive attitude apparent in face or speech  Anxiety Somatic: Moderate  Somatic Symptoms Gastro-Intestinal: Loss of appetite but eating without staff encouragement. Heavy feelings in abdomen  General Somatic Symptoms: Any clear-cut symptom rates 2  Genital Symptoms: Absent  Hypochondriasis: Frequent complaints, requests for help, etc.  Loss of Weight: Probable weight loss associated with present illness  Insight: Acknowledges illness but attributes cause to bad food, climate, overwork, virus, need for rest, etc.  HAM-D Total Score: 34      Intra-Treatment Assessment:  ? Spravato (Esketamine) Start Time: 10:58  ? Spravato (Esketamine) Blood Pressure Checks:  Vitals:    10/10/20 1056 10/10/20 1138 10/10/20 1258   BP: 127/77 (!) 166/120 (!) 129/91   BP Source: Arm, Right Upper Arm, Right Upper Arm, Right Upper   Pulse: 61 74 62   PainSc:   Zero     ? Spravato (Esketamine) Complications or Adverse Effects: Dissociation   ? Spravato (Esketamine) Recommendations For Next Administration: None   ? Spravato (Esketamine) Stop Time: 12:58      Post-Treatment and Discharge Assessment:  ? Mood I feel off  ? Presence of SI/HI and AVH/Paranoia? No  ? Any Physical Complaints: No  ? Spravato REMS Patient Monitoring Form Submitted? Yes     Review of Systems   Constitutional: Positive for appetite change and fatigue. Negative for chills, fever and unexpected weight change.   HENT: Negative for congestion, postnasal drip and sore throat.    Gastrointestinal: Negative for abdominal pain, constipation, diarrhea, nausea and vomiting.   Neurological: Negative for light-headedness, numbness and headaches.   Psychiatric/Behavioral: Positive for decreased concentration, dysphoric mood and sleep disturbance. Negative for confusion, hallucinations, self-injury and suicidal ideas. The patient is nervous/anxious. The patient is not hyperactive.        Objective:         ? aspirin EC 81 mg tablet Take 81 mg by mouth daily. Every other day.   ? AUVI-Q 0.3 mg/0.3 mL auto-injector Inject 0.3 mg (1 Pen) into thigh if needed for anaphylactic reaction. May repeat in 5-15 minutes if needed.   ? Cholecalciferol (Vitamin D3) 1,000 unit cap Take 5,000 Units by mouth.   ? clonazePAM (KLONOPIN) 0.5 mg tablet Take 2 mg by mouth at bedtime daily.   ? clonazePAM (KLONOPIN) 2 mg tablet Take one tablet by mouth at bedtime daily.   ? cyproheptadine (PERIACTIN) 4 mg tablet Take one tablet by mouth at bedtime daily.   ? docosahexaenoic acid/epa (FISH OIL PO) Take 3 capsules by mouth daily.   ? EPINEPHrine (EPIPEN 2-PAK) 1 mg/mL injection pen (2-Pack) Inject 0.3 mg (1 Pen) into thigh if needed for anaphylactic reaction. May repeat in 5-15 minutes if needed.   ? esketamine (SPRAVATO) 56 mg (28 mg x 2) nasal spray Insert four sprays into nose as directed twice weekly. Wait 5 minutes between use of each device.  Indications: additional treatment for major depressive disorder   ? esketamine (SPRAVATO) 84 mg (28 mg x 3) nasal spray Insert six sprays into nose as directed twice weekly. Wait 5 minutes between use of each device.  Indications: additional treatment for major depressive disorder   ? estradioL (CLIMARA) 0.075 mg/24 hr patch Apply one patch to top of skin as directed twice weekly.   ? ezetimibe (ZETIA) 10 mg tablet Take one tablet by mouth daily.   ? flecainide (TAMBOCOR) 100 mg tablet Take one  tablet by mouth twice daily. (Patient taking differently: Take 50 mg by mouth twice daily.)   ? FLUoxetine (PROZAC) 20 mg capsule Take three capsules by mouth daily. Indications: bulimia, major depressive disorder, posttraumatic stress syndrome   ? ketotifen (ZADITOR) 0.025 % (0.035 %) ophthalmic solution Place one drop into or around eye(s) twice daily as needed.   ? levothyroxine (SYNTHROID) 100 mcg tablet Take one tablet by mouth daily 30 minutes before breakfast.   ? magnesium oxide (MAG-OX) 400 mg (241.3 mg magnesium) tablet Take 400 mg by mouth daily.   ? olopatadine (PATANASE) 0.6 % nasal spray Apply one spray to two sprays to each nostril as directed twice daily as needed.   ? rOPINIRole (REQUIP) 0.5 mg tablet Take 0.5 mg by mouth at bedtime daily.   ? rosuvastatin (CRESTOR) 10 mg tablet Take one tablet by mouth daily.   ? zinc sulfate 220 mg (50 mg elemental zinc) capsule Take 220 mg by mouth daily.       Vitals:    10/10/20 1056 10/10/20 1138 10/10/20 1258   BP: 127/77 (!) 166/120 (!) 129/91   BP Source: Arm, Right Upper Arm, Right Upper Arm, Right Upper   Pulse: 61 74 62   PainSc:   Zero     There is no height or weight on file to calculate BMI.     Physical Exam  Vitals reviewed.   Constitutional:       General: She is not in acute distress.     Appearance: She is obese. She is not ill-appearing.   HENT:      Head: Normocephalic and atraumatic.   Pulmonary:      Effort: Pulmonary effort is normal. No respiratory distress.   Neurological:      Mental Status: She is alert.      Gait: Gait normal.         Mental Status Evaluation:    General/Constitutional: Appears stated age, in personal attire, good hygiene and grooming.   Eye Contact: Fair  Behavior: Appropriate and cooperative  Speech: regular rate and rhythm, normal tone and volume.   Mood: Down  Affect: Dysphoric, mood congruent  Thought Process: Linear, organized, easy to follow and understand.  Thought Content: Denies HI. Passive SI w/o plan or intent   Perception: Denies AVH. No evidence of paranoia, delusions, or illusions.   Associations: Intact  Insight/Judgment: Good/Good         Assessment and Plan:     Summary/Formulation:   Holly Maldonado is a 60 y.o. female with a history of MDD, PTSD, and bulimia who presents to [facility] for Spravato Administration    Problem   Severe Episode of Recurrent Major Depressive Disorder, Without Psychotic Features (Hcc)    10/10/2020   Stable-Chronic  -Current symptoms: SIGECAPS (9/9)   -Denies HI and AVH. Passive SI w/o plan or intent  PHQ-9  PHQ-9 Score: 27 (10/10/2020 10:59 AM)    Over the past 2 weeks, how often have you been bothered by any of the following problems?  Little interest or pleasure in doing things: (!) Nearly Every Day  Feeling down, depressed or hopeless: (!) Nearly Every Day  PHQ-2 Score: 6  If the score is > / = 3, proceed with the questions below:  Trouble falling asleep, staying asleep, or sleeping too much: Nearly Every Day  Feeling tired or having little energy: Nearly Every Day  Poor appetite or overeating: Nearly Every Day  Feeling bad about yourself -  or that you're a failure or have let  yourself or your family down: Nearly Every Day  Trouble concentrating on things, such as reading the newspaper or watching television: Nearly Every Day  Moving or speaking so slowly that other people could have noticed. Or, the opposite - being so fidgety or restless that you have been moving around a lot more than usual: Nearly Every Day  Thoughts that you would be better off dead or of hurting yourself in some way: Nearly Every Day  PHQ-9 Score: 27     Plan:  The following medications will be continued for the above symptoms:  -Continue Klonopin 2mg  PO QHS  -Continue Prozac 60mg  PO Daily  -Continue Spravato 84mg  IN 2x/week Induction              Post Discharge Instructions:  Do Not: Drive, Operate Heavy Machinery, or make major life decisions until the following day.   Contact the clinic with any side effects, questions, or concerns ([phone])    Holly Mar, MD    Patient identified as a high fall risk during pre-visit planning due to the potential for impaired balance during the administration and side effects of Spravato treatment. Although the patient will remain in the clinic and monitored under the supervision of myself and a RN for the entirety of the session, patient will be flagged as a high fall risk.     A high fall risk band was placed on the patient if they needed to leave the room during treatment and the room was marked with a yellow triangle.   The patient was kept in the lowest and safest position possible during the time he/she was present in the clinic.    The patient did not ambulate on their own while in the clinic aside from entering or exiting the clinic when deemed safe to go home. Any patient ambulation during the 2 hours procedure window was done with at least 1 nurse or physician assisting the patient.    This note was composed with assistance of Office manager.  There may be transcriptional errors. If you have questions regarding the note please reach out to Holly Mar, MD directly.

## 2020-10-06 NOTE — Patient Instructions
It was nice to see you in clinic today.  If questions arise, you can reach the clinic by calling [phone].    Actions from today's visit:  Post Discharge Instructions:  Do Not: Drive, Operate Heavy Machinery, or make major life decisions until the following day.   Contact the clinic with any side effects, questions, or concerns ([phone])    Dr. Grasiela Jonsson    Please direct medication refill requests to your pharmacy.    In the event of a safety concern or suicidal thoughts, call 911 or go to the nearest emergency room. National Suicide Prevention Lifeline 800-273-8255 (Talk). Crisis Text Hotline (text 741741).     The [employer] phone line is a resource for talk support in non-emergency situations (1-866-WARMEAR).

## 2020-10-06 NOTE — Patient Instructions
It was nice to see you in clinic today.  If questions arise, you can reach the clinic by calling [phone].    Actions from today's visit:  Post Discharge Instructions:  Do Not: Drive, Operate Heavy Machinery, or make major life decisions until the following day.   Contact the clinic with any side effects, questions, or concerns ([phone])    Dr. Jayzen Paver    Please direct medication refill requests to your pharmacy.    In the event of a safety concern or suicidal thoughts, call 911 or go to the nearest emergency room. National Suicide Prevention Lifeline 800-273-8255 (Talk). Crisis Text Hotline (text 741741).     The [facility] phone line is a resource for talk support in non-emergency situations (1-866-WARMEAR).

## 2020-10-06 NOTE — Progress Notes
Spravato (Esketamine) Note    Date of Service: 10/08/2020    Subjective:      Holly Maldonado is a 60 y.o. female  [id number]    Total of 120 minutes were spent on the same day of the visit including preparing to see the patient, obtaining history, performing a medically appropriate examination, counseling and educating the patient, observing and monitoring Spravato Treatment, documenting clinical information in the electronic health record and care coordination.     History of Present Illness   Holly Maldonado is a 60 y.o. female  w/ a pmh significant for MDD, PTSD, and bulimia. Who is seen today for Spravato Administration    Patient Information:  ? Spravato Charity fundraiser) Rx Verificaiton: Holly Maldonado 09-Dec-1960 10/08/2020 Yes   ? Spravato (Esketamine) Treatment #: [id number]  ? Spravato (Esketamine) Dose: 84mg        Pre-Treatment Assessment:  ? Last Food: > 2 Hours? Yes   ? Last Drink: > 30 Minutes? Yes   ? Last Nasal Spray: > 2 Hours? Yes   ? Spravato (Esketamine) Complications or Adverse Effects since last visit?: Lethargy     Since her last visit, the pt reports continued anhedonia, depressed mood, sleep disturbances mainly due to nightmares, appetite changes, fatigue, poor concentration, feelings of worthlessness, PMA, and passive SI w/o plan or intent. She denies HI and AVH. She notes that the last session was extremely difficult and unearthed significant trauma symptoms. She is working through these with her therapist and is finding the sessions challenging but still worthwhile.     ? MADRS Score: 50    ? PHQ-9  PHQ-9 Score: 27 (10/08/2020 10:59 AM)    Over the past 2 weeks, how often have you been bothered by any of the following problems?  Little interest or pleasure in doing things: (!) Nearly Every Day  Feeling down, depressed or hopeless: (!) Nearly Every Day  PHQ-2 Score: 6  If the score is > / = 3, proceed with the questions below:  Trouble falling asleep, staying asleep, or sleeping too much: Nearly Every Day  Feeling tired or having little energy: Nearly Every Day  Poor appetite or overeating: Nearly Every Day  Feeling bad about yourself - or that you're a failure or have let  yourself or your family down: Nearly Every Day  Trouble concentrating on things, such as reading the newspaper or watching television: Nearly Every Day  Moving or speaking so slowly that other people could have noticed. Or, the opposite - being so fidgety or restless that you have been moving around a lot more than usual: Nearly Every Day  Thoughts that you would be better off dead or of hurting yourself in some way: Nearly Every Day  PHQ-9 Score: 27    ? HAM-D  Hamilton Rating Scale for Depression  Depressed Mood: Patient reports virtually only these feeling states in his/her spontaneous verbal and non-verbal communication  Feelings of Guilt: Present illness is a punishment. Delusions of guilt  Suicide: Wishes he/she were dead or any thoughts of possible death to self  Insomnia: Early in the Night: Complains of nightly difficulty falling asleep  Insomnia: Middle of the Night: Waking during the night - any getting out of bed rates 2 (except for purposes of voiding)  Insomnia: Early Hours of the Morning: Unable to fall asleep again if he/she gets out of bed  Work and Activities: Stopped working because of present illness. Rate 4 if patient engages in  no activities except routine chores, or if patient fails to perform routine chores unassisted  Retardation: Slight retardation during the interview  Agitation: Fidgetiness  Anxiety Psychic: Apprehensive attitude apparent in face or speech  Anxiety Somatic: Moderate  Somatic Symptoms Gastro-Intestinal: Loss of appetite but eating without staff encouragement. Heavy feelings in abdomen  General Somatic Symptoms: Any clear-cut symptom rates 2  Genital Symptoms: Absent  Hypochondriasis: Frequent complaints, requests for help, etc.  Loss of Weight: Probable weight loss associated with present illness  Insight: Acknowledges illness but attributes cause to bad food, climate, overwork, virus, need for rest, etc.  HAM-D Total Score: 34      Intra-Treatment Assessment:  ? Spravato (Esketamine) Start Time: 11:06  ? Spravato (Esketamine) Blood Pressure Checks:  Vitals:    10/08/20 1057 10/08/20 1146 10/08/20 1306   BP: 132/83 (!) 118/98 126/75   BP Source: Arm, Right Upper Arm, Right Upper Arm, Right Upper   Pulse: 63 74 58     ? Spravato (Esketamine) Complications or Adverse Effects: Dissociation and Dizziness and Vertigo   ? Spravato (Esketamine) Recommendations For Next Administration: None   ? Spravato (Esketamine) Stop Time: 13:06      Post-Treatment and Discharge Assessment:  ? Mood Not quite normal yet but okay  ? Presence of SI/HI and AVH/Paranoia? No  ? Any Physical Complaints: No  ? Spravato REMS Patient Monitoring Form Submitted? Yes     Review of Systems   Constitutional: Positive for appetite change and fatigue. Negative for chills, fever and unexpected weight change.   HENT: Positive for congestion. Negative for postnasal drip and sore throat.    Gastrointestinal: Negative for abdominal pain, constipation, diarrhea, nausea and vomiting.   Neurological: Negative for light-headedness, numbness and headaches.   Psychiatric/Behavioral: Positive for decreased concentration, dysphoric mood, sleep disturbance and suicidal ideas (Passive SI w/o plan or intent). Negative for hallucinations and self-injury. The patient is nervous/anxious. The patient is not hyperactive.        Objective:         ? aspirin EC 81 mg tablet Take 81 mg by mouth daily. Every other day.   ? AUVI-Q 0.3 mg/0.3 mL auto-injector Inject 0.3 mg (1 Pen) into thigh if needed for anaphylactic reaction. May repeat in 5-15 minutes if needed.   ? Cholecalciferol (Vitamin D3) 1,000 unit cap Take 5,000 Units by mouth.   ? clonazePAM (KLONOPIN) 0.5 mg tablet Take 2 mg by mouth at bedtime daily.   ? clonazePAM (KLONOPIN) 2 mg tablet Take one tablet by mouth at bedtime daily.   ? cyproheptadine (PERIACTIN) 4 mg tablet Take one tablet by mouth at bedtime daily.   ? docosahexaenoic acid/epa (FISH OIL PO) Take 3 capsules by mouth daily.   ? EPINEPHrine (EPIPEN 2-PAK) 1 mg/mL injection pen (2-Pack) Inject 0.3 mg (1 Pen) into thigh if needed for anaphylactic reaction. May repeat in 5-15 minutes if needed.   ? esketamine (SPRAVATO) 56 mg (28 mg x 2) nasal spray Insert four sprays into nose as directed twice weekly. Wait 5 minutes between use of each device.  Indications: additional treatment for major depressive disorder   ? esketamine (SPRAVATO) 84 mg (28 mg x 3) nasal spray Insert six sprays into nose as directed twice weekly. Wait 5 minutes between use of each device.  Indications: additional treatment for major depressive disorder   ? estradioL (CLIMARA) 0.075 mg/24 hr patch Apply one patch to top of skin as directed twice weekly.   ? ezetimibe (ZETIA) 10  mg tablet Take one tablet by mouth daily.   ? flecainide (TAMBOCOR) 100 mg tablet Take one tablet by mouth twice daily. (Patient taking differently: Take 50 mg by mouth twice daily.)   ? FLUoxetine (PROZAC) 20 mg capsule Take three capsules by mouth daily. Indications: bulimia, major depressive disorder, posttraumatic stress syndrome   ? ketotifen (ZADITOR) 0.025 % (0.035 %) ophthalmic solution Place one drop into or around eye(s) twice daily as needed.   ? levothyroxine (SYNTHROID) 100 mcg tablet Take one tablet by mouth daily 30 minutes before breakfast.   ? magnesium oxide (MAG-OX) 400 mg (241.3 mg magnesium) tablet Take 400 mg by mouth daily.   ? olopatadine (PATANASE) 0.6 % nasal spray Apply one spray to two sprays to each nostril as directed twice daily as needed.   ? rOPINIRole (REQUIP) 0.5 mg tablet Take 0.5 mg by mouth at bedtime daily.   ? rosuvastatin (CRESTOR) 10 mg tablet Take one tablet by mouth daily.   ? zinc sulfate 220 mg (50 mg elemental zinc) capsule Take 220 mg by mouth daily. Vitals:    10/08/20 1057 10/08/20 1146 10/08/20 1306   BP: 132/83 (!) 118/98 126/75   BP Source: Arm, Right Upper Arm, Right Upper Arm, Right Upper   Pulse: 63 74 58     There is no height or weight on file to calculate BMI.     Physical Exam  Vitals reviewed.   Constitutional:       General: She is not in acute distress.     Appearance: She is not ill-appearing.   HENT:      Head: Normocephalic and atraumatic.   Pulmonary:      Effort: Pulmonary effort is normal. No respiratory distress.   Neurological:      Mental Status: She is alert.      Gait: Gait normal.         Mental Status Evaluation:    General/Constitutional: Appears stated age, in personal attire, good hygiene and grooming.   Eye Contact: Fair  Behavior: Appropriate and cooperative  Speech: regular rate and rhythm, normal tone and volume.   Mood: Okay  Affect: Content, mood congruent  Thought Process: Linear, organized, easy to follow and understand.  Thought Content: Denies HI. Passive SI w/o plan or intent   Perception: Denies AVH. No evidence of paranoia, delusions, or illusions.   Associations: Intact  Insight/Judgment: Good/Good         Assessment and Plan:     Summary/Formulation:   Holly Maldonado is a 60 y.o. female with a history of MDD, PTSD, and bulimia who presents to [facility] outpatient [facility] for Spravato Administration    Problem   Severe Episode of Recurrent Major Depressive Disorder, Without Psychotic Features (Hcc)    10/08/2020   Stable-Chronic  -Current symptoms: SIGECAPS (9/9)   -Denies HI and AVH. Passive SI w/o plan or intent  PHQ-9  PHQ-9 Score: 27 (10/08/2020 10:59 AM)    Over the past 2 weeks, how often have you been bothered by any of the following problems?  Little interest or pleasure in doing things: (!) Nearly Every Day  Feeling down, depressed or hopeless: (!) Nearly Every Day  PHQ-2 Score: 6  If the score is > / = 3, proceed with the questions below:  Trouble falling asleep, staying asleep, or sleeping too much: Nearly Every Day  Feeling tired or having little energy: Nearly Every Day  Poor appetite or overeating: Nearly Every Day  Feeling bad  about yourself - or that you're a failure or have let  yourself or your family down: Nearly Every Day  Trouble concentrating on things, such as reading the newspaper or watching television: Nearly Every Day  Moving or speaking so slowly that other people could have noticed. Or, the opposite - being so fidgety or restless that you have been moving around a lot more than usual: Nearly Every Day  Thoughts that you would be better off dead or of hurting yourself in some way: Nearly Every Day  PHQ-9 Score: 27     Plan:  The following medications will be continued for the above symptoms:  -Continue Klonopin 2mg  PO QHS  -Continue Prozac 60mg  PO Daily  -Continue Spravato 84mg  IN 2x/week Induction              Post Discharge Instructions:  Do Not: Drive, Operate Heavy Machinery, or make major life decisions until the following day.   Contact the clinic with any side effects, questions, or concerns ([phone])    Holly Mar, MD    Patient identified as a high fall risk during pre-visit planning due to the potential for impaired balance during the administration and side effects of Spravato treatment. Although the patient will remain in the clinic and monitored under the supervision of myself and a RN for the entirety of the session, patient will be flagged as a high fall risk.     A high fall risk band was placed on the patient if they needed to leave the room during treatment and the room was marked with a yellow triangle.   The patient was kept in the lowest and safest position possible during the time he/she was present in the clinic.    The patient did not ambulate on their own while in the clinic aside from entering or exiting the clinic when deemed safe to go home. Any patient ambulation during the 2 hours procedure window was done with at least 1 nurse or physician assisting the patient.    This note was composed with assistance of Office manager.  There may be transcriptional errors. If you have questions regarding the note please reach out to Holly Mar, MD directly.

## 2020-10-07 ENCOUNTER — Encounter: Admit: 2020-10-07 | Discharge: 2020-10-07 | Payer: No Typology Code available for payment source

## 2020-10-08 ENCOUNTER — Encounter: Admit: 2020-10-08 | Discharge: 2020-10-08 | Payer: No Typology Code available for payment source

## 2020-10-08 ENCOUNTER — Ambulatory Visit: Admit: 2020-10-08 | Discharge: 2020-10-08 | Payer: No Typology Code available for payment source

## 2020-10-08 DIAGNOSIS — F4310 Post-traumatic stress disorder, unspecified: Secondary | ICD-10-CM

## 2020-10-08 DIAGNOSIS — E7800 Pure hypercholesterolemia, unspecified: Secondary | ICD-10-CM

## 2020-10-08 DIAGNOSIS — E785 Hyperlipidemia, unspecified: Secondary | ICD-10-CM

## 2020-10-08 DIAGNOSIS — Z8659 Personal history of other mental and behavioral disorders: Secondary | ICD-10-CM

## 2020-10-08 DIAGNOSIS — G4730 Sleep apnea, unspecified: Secondary | ICD-10-CM

## 2020-10-08 DIAGNOSIS — R000 Tachycardia, unspecified: Secondary | ICD-10-CM

## 2020-10-08 DIAGNOSIS — E039 Hypothyroidism, unspecified: Secondary | ICD-10-CM

## 2020-10-08 DIAGNOSIS — F99 Mental disorder, not otherwise specified: Secondary | ICD-10-CM

## 2020-10-08 DIAGNOSIS — I499 Cardiac arrhythmia, unspecified: Secondary | ICD-10-CM

## 2020-10-08 DIAGNOSIS — F332 Major depressive disorder, recurrent severe without psychotic features: Secondary | ICD-10-CM

## 2020-10-08 DIAGNOSIS — N390 Urinary tract infection, site not specified: Secondary | ICD-10-CM

## 2020-10-08 DIAGNOSIS — F502 Bulimia nervosa: Secondary | ICD-10-CM

## 2020-10-08 DIAGNOSIS — Q615 Medullary cystic kidney: Secondary | ICD-10-CM

## 2020-10-08 DIAGNOSIS — F32A Depressed: Secondary | ICD-10-CM

## 2020-10-08 DIAGNOSIS — F4312 Post-traumatic stress disorder, chronic: Secondary | ICD-10-CM

## 2020-10-08 MED ORDER — ESKETAMINE 84 MG (28 MG X 3) NA SPRY
84 mg | Freq: Once | NASAL | 0 refills | Status: CP
Start: 2020-10-08 — End: ?
  Administered 2020-10-08: 16:00:00 84 mg via NASAL

## 2020-10-08 MED FILL — SPRAVATO 84 MG (28 MG X 3) NA SPRY: 84 mg (28 mg x 3) | NASAL | 4 days supply | Qty: 3 | Fill #2 | Status: CP

## 2020-10-08 NOTE — Progress Notes
Spravato Treatment Nursing Assessment    Intra-Treatment Assessment:   Spravato (Esketamine) Blood Pressure Checks:  Vitals:    10/08/20 1057 10/08/20 1146 10/08/20 1306   BP: 132/83 (!) 118/98 126/75   BP Source: Arm, Right Upper Arm, Right Upper Arm, Right Upper   Pulse: 63 74 58      Spravato (Esketamine) Complications or Adverse Effects: Dizziness and Vertigo      Post-Treatment and Discharge Assessment:   Mood "I can't really remember anything this time."   Presence of SI/HI and AVH/Paranoia? No   Any Physical Complaints: No    Patient identified as a high fall risk during pre-visit planning due to the potential for impaired balance during the administration and side effects of Spravato treatment. Although the patient will remain in the clinic and monitored under the supervision of myself and a RN for the entirety of the session, patient will be flagged as a high fall risk.     A high fall risk band was placed on the patient if they needed to leave the room during treatment and the room was marked with a yellow triangle.   The patient was kept in the lowest and safest position possible during the time he/she was present in the clinic.    The patient did not ambulate on their own while in the clinic aside from entering or exiting the clinic when deemed safe to go home. Any patient ambulation during the 2 hours procedure window was done with at least 1 nurse or physician assisting the patient.

## 2020-10-09 ENCOUNTER — Encounter: Admit: 2020-10-09 | Discharge: 2020-10-09 | Payer: No Typology Code available for payment source

## 2020-10-10 ENCOUNTER — Ambulatory Visit: Admit: 2020-10-10 | Discharge: 2020-10-10 | Payer: No Typology Code available for payment source

## 2020-10-10 ENCOUNTER — Encounter: Admit: 2020-10-10 | Discharge: 2020-10-10 | Payer: No Typology Code available for payment source

## 2020-10-10 DIAGNOSIS — E785 Hyperlipidemia, unspecified: Secondary | ICD-10-CM

## 2020-10-10 DIAGNOSIS — F332 Major depressive disorder, recurrent severe without psychotic features: Secondary | ICD-10-CM

## 2020-10-10 DIAGNOSIS — R000 Tachycardia, unspecified: Secondary | ICD-10-CM

## 2020-10-10 DIAGNOSIS — E039 Hypothyroidism, unspecified: Secondary | ICD-10-CM

## 2020-10-10 DIAGNOSIS — F32A Depressed: Secondary | ICD-10-CM

## 2020-10-10 DIAGNOSIS — Q615 Medullary cystic kidney: Secondary | ICD-10-CM

## 2020-10-10 DIAGNOSIS — I499 Cardiac arrhythmia, unspecified: Secondary | ICD-10-CM

## 2020-10-10 DIAGNOSIS — F99 Mental disorder, not otherwise specified: Secondary | ICD-10-CM

## 2020-10-10 DIAGNOSIS — G4730 Sleep apnea, unspecified: Secondary | ICD-10-CM

## 2020-10-10 DIAGNOSIS — N390 Urinary tract infection, site not specified: Secondary | ICD-10-CM

## 2020-10-10 DIAGNOSIS — F4310 Post-traumatic stress disorder, unspecified: Secondary | ICD-10-CM

## 2020-10-10 DIAGNOSIS — J309 Allergic rhinitis, unspecified: Secondary | ICD-10-CM

## 2020-10-10 DIAGNOSIS — E7800 Pure hypercholesterolemia, unspecified: Secondary | ICD-10-CM

## 2020-10-10 DIAGNOSIS — Z8659 Personal history of other mental and behavioral disorders: Secondary | ICD-10-CM

## 2020-10-10 MED ORDER — ESKETAMINE 84 MG (28 MG X 3) NA SPRY
84 mg | Freq: Once | NASAL | 0 refills | Status: CP
Start: 2020-10-10 — End: ?
  Administered 2020-10-10: 16:00:00 84 mg via NASAL

## 2020-10-10 MED FILL — SPRAVATO 84 MG (28 MG X 3) NA SPRY: 84 mg (28 mg x 3) | NASAL | 4 days supply | Qty: 3 | Fill #3 | Status: CP

## 2020-10-10 NOTE — Progress Notes
Spravato Treatment Nursing Assessment    Intra-Treatment Assessment:   Spravato (Esketamine) Blood Pressure Checks:  Vitals:    10/10/20 1056 10/10/20 1138 10/10/20 1258   BP: 127/77 (!) 166/120 (!) 129/91   BP Source: Arm, Right Upper Arm, Right Upper Arm, Right Upper   Pulse: 61 74 62   PainSc:   Zero      Spravato (Esketamine) Complications or Adverse Effects: Lethargy      Post-Treatment and Discharge Assessment:   Mood "They kept telling me to kill myself and quit breathing"   Presence of SI/HI and AVH/Paranoia? No   Any Physical Complaints: No    Patient identified as a high fall risk during pre-visit planning due to the potential for impaired balance during the administration and side effects of Spravato treatment. Although the patient will remain in the clinic and monitored under the supervision of myself and a RN for the entirety of the session, patient will be flagged as a high fall risk.     A high fall risk band was placed on the patient if they needed to leave the room during treatment and the room was marked with a yellow triangle.   The patient was kept in the lowest and safest position possible during the time he/she was present in the clinic.    The patient did not ambulate on their own while in the clinic aside from entering or exiting the clinic when deemed safe to go home. Any patient ambulation during the 2 hours procedure window was done with at least 1 nurse or physician assisting the patient.

## 2020-10-12 ENCOUNTER — Encounter: Admit: 2020-10-12 | Discharge: 2020-10-12 | Payer: No Typology Code available for payment source

## 2020-10-12 MED ORDER — CLONAZEPAM 2 MG PO TAB
ORAL_TABLET | Freq: Every day | 0 refills
Start: 2020-10-12 — End: ?

## 2020-10-13 ENCOUNTER — Encounter: Admit: 2020-10-13 | Discharge: 2020-10-13 | Payer: No Typology Code available for payment source

## 2020-10-13 MED FILL — SPRAVATO 84 MG (28 MG X 3) NA SPRY: 84 mg (28 mg x 3) | NASAL | 4 days supply | Qty: 3 | Fill #4 | Status: CP

## 2020-10-14 ENCOUNTER — Ambulatory Visit: Admit: 2020-10-14 | Discharge: 2020-10-14 | Payer: No Typology Code available for payment source

## 2020-10-14 ENCOUNTER — Encounter: Admit: 2020-10-14 | Discharge: 2020-10-14 | Payer: No Typology Code available for payment source

## 2020-10-14 DIAGNOSIS — F332 Major depressive disorder, recurrent severe without psychotic features: Secondary | ICD-10-CM

## 2020-10-14 MED ORDER — ESKETAMINE 84 MG (28 MG X 3) NA SPRY
84 mg | Freq: Once | NASAL | 0 refills | Status: CP
Start: 2020-10-14 — End: ?
  Administered 2020-10-14: 13:00:00 84 mg via NASAL

## 2020-10-14 NOTE — Progress Notes
Spravato Treatment Nursing Assessment    Intra-Treatment Assessment:   Spravato (Esketamine) Blood Pressure Checks:  Vitals:    10/14/20 0806 10/14/20 0849 10/14/20 1009   BP: 127/77 (!) 146/91 (!) 145/83   BP Source: Arm, Right Upper Arm, Right Upper Arm, Right Upper   Pulse: 67 65 59      Spravato (Esketamine) Complications or Adverse Effects: Dissociation      Post-Treatment and Discharge Assessment:   Mood "sad, kind of scared"   Presence of SI/HI and AVH/Paranoia? No   Any Physical Complaints: No    Patient identified as a high fall risk during pre-visit planning due to the potential for impaired balance during the administration and side effects of Spravato treatment. Although the patient will remain in the clinic and monitored under the supervision of myself and a RN for the entirety of the session, patient will be flagged as a high fall risk.     A high fall risk band was placed on the patient if they needed to leave the room during treatment and the room was marked with a yellow triangle.   The patient was kept in the lowest and safest position possible during the time he/she was present in the clinic.    The patient did not ambulate on their own while in the clinic aside from entering or exiting the clinic when deemed safe to go home. Any patient ambulation during the 2 hours procedure window was done with at least 1 nurse or physician assisting the patient.

## 2020-10-14 NOTE — Progress Notes
Spravato (Esketamine) Note    Date of Service: 10/14/2020    Subjective:      Holly Maldonado is a 60 y.o. female  [id number]    Total of 120 minutes were spent on the same day of the visit including preparing to see the patient, obtaining history, performing a medically appropriate examination, counseling and educating the patient, observing and monitoring Spravato Treatment, documenting clinical information in the electronic health record and care coordination.     History of Present Illness   Holly Maldonado is a 60 y.o. female  w/ a pmh significant for MDD, PTSD, and bulimia. Who is seen today for Spravato Administration    Patient Information:  ? Spravato Charity fundraiser) Rx Verificaiton: Holly Maldonado 07/02/1960 10/14/2020 Yes   ? Spravato (Esketamine) Treatment #: 9  ? Spravato (Esketamine) Dose: 84mg        Pre-Treatment Assessment:  ? Last Food: > 2 Hours? Yes   ? Last Drink: > 30 Minutes? Yes   ? Last Nasal Spray: > 2 Hours? Yes   ? Spravato (Esketamine) Complications or Adverse Effects since last visit?: Lethargy and Detachment     Since her last visit, the pt reports continued feelings of detachment. She notes that her passive feelings SI were a little more intense over the week but she did not feel unsafe or have any active plans or intent. She continues to explore her trauma history with her therapist. She notes depressed mood, anhedonia, sleep disturbances, appetite changes, anergy, feelings of guilt and worthlessness, poor concentration, and PMA.     ? MADRS Score: 51    ? PHQ-9  PHQ-9 Score: 27 (10/14/2020  8:09 AM)    Over the past 2 weeks, how often have you been bothered by any of the following problems?  Little interest or pleasure in doing things: (!) Nearly Every Day  Feeling down, depressed or hopeless: (!) Nearly Every Day  PHQ-2 Score: 6  If the score is > / = 3, proceed with the questions below:  Trouble falling asleep, staying asleep, or sleeping too much: Nearly Every Day  Feeling tired or having little energy: Nearly Every Day  Poor appetite or overeating: Nearly Every Day  Feeling bad about yourself - or that you're a failure or have let  yourself or your family down: Nearly Every Day  Trouble concentrating on things, such as reading the newspaper or watching television: Nearly Every Day  Moving or speaking so slowly that other people could have noticed. Or, the opposite - being so fidgety or restless that you have been moving around a lot more than usual: Nearly Every Day  Thoughts that you would be better off dead or of hurting yourself in some way: Nearly Every Day  PHQ-9 Score: 27    ? HAM-D  Hamilton Rating Scale for Depression  Depressed Mood: Patient reports virtually only these feeling states in his/her spontaneous verbal and non-verbal communication  Feelings of Guilt: Present illness is a punishment. Delusions of guilt  Suicide: Wishes he/she were dead or any thoughts of possible death to self  Insomnia: Early in the Night: Complains of nightly difficulty falling asleep  Insomnia: Middle of the Night: Waking during the night - any getting out of bed rates 2 (except for purposes of voiding)  Insomnia: Early Hours of the Morning: Unable to fall asleep again if he/she gets out of bed  Work and Activities: Stopped working because of present illness. Rate 4 if patient  engages in no activities except routine chores, or if patient fails to perform routine chores unassisted  Retardation: Slight retardation during the interview  Agitation: Fidgetiness  Anxiety Psychic: Apprehensive attitude apparent in face or speech  Anxiety Somatic: Moderate  Somatic Symptoms Gastro-Intestinal: Loss of appetite but eating without staff encouragement. Heavy feelings in abdomen  General Somatic Symptoms: Any clear-cut symptom rates 2  Genital Symptoms: Absent  Hypochondriasis: Frequent complaints, requests for help, etc.  Loss of Weight: Probable weight loss associated with present illness  Insight: Acknowledges illness but attributes cause to bad food, climate, overwork, virus, need for rest, etc.  HAM-D Total Score: 34      Intra-Treatment Assessment:  ? Spravato (Esketamine) Start Time: 8:09  ? Spravato (Esketamine) Blood Pressure Checks:  Vitals:    10/14/20 0806 10/14/20 0849 10/14/20 1009   BP: 127/77 (!) 146/91 (!) 145/83   BP Source: Arm, Right Upper Arm, Right Upper Arm, Right Upper   Pulse: 67 65 59     ? Spravato (Esketamine) Complications or Adverse Effects: Dissociation   ? Spravato (Esketamine) Recommendations For Next Administration: None   ? Spravato (Esketamine) Stop Time: 10:09      Post-Treatment and Discharge Assessment:  ? Mood Sad  ? Presence of SI/HI and AVH/Paranoia? No  ? Any Physical Complaints: No  ? Spravato REMS Patient Monitoring Form Submitted? Yes     Review of Systems   Constitutional: Positive for appetite change and fatigue. Negative for chills, fever and unexpected weight change.   HENT: Negative for congestion, postnasal drip and sore throat.    Gastrointestinal: Negative for abdominal pain, constipation, diarrhea, nausea and vomiting.   Neurological: Negative for light-headedness, numbness and headaches.   Psychiatric/Behavioral: Positive for decreased concentration, dysphoric mood and sleep disturbance. Negative for confusion, hallucinations, self-injury and suicidal ideas. The patient is nervous/anxious. The patient is not hyperactive.        Objective:         ? aspirin EC 81 mg tablet Take 81 mg by mouth daily. Every other day.   ? AUVI-Q 0.3 mg/0.3 mL auto-injector Inject 0.3 mg (1 Pen) into thigh if needed for anaphylactic reaction. May repeat in 5-15 minutes if needed.   ? Cholecalciferol (Vitamin D3) 1,000 unit cap Take 5,000 Units by mouth.   ? clonazePAM (KLONOPIN) 0.5 mg tablet Take 2 mg by mouth at bedtime daily.   ? clonazePAM (KLONOPIN) 2 mg tablet TAKE 1 TABLET BY MOUTH EVERY DAY AT BEDTIME   ? cyproheptadine (PERIACTIN) 4 mg tablet Take one tablet by mouth at bedtime daily.   ? docosahexaenoic acid/epa (FISH OIL PO) Take 3 capsules by mouth daily.   ? EPINEPHrine (EPIPEN 2-PAK) 1 mg/mL injection pen (2-Pack) Inject 0.3 mg (1 Pen) into thigh if needed for anaphylactic reaction. May repeat in 5-15 minutes if needed.   ? esketamine (SPRAVATO) 56 mg (28 mg x 2) nasal spray Insert four sprays into nose as directed twice weekly. Wait 5 minutes between use of each device.  Indications: additional treatment for major depressive disorder   ? esketamine (SPRAVATO) 84 mg (28 mg x 3) nasal spray Insert six sprays into nose as directed twice weekly. Wait 5 minutes between use of each device.  Indications: additional treatment for major depressive disorder   ? estradioL (CLIMARA) 0.075 mg/24 hr patch Apply one patch to top of skin as directed twice weekly.   ? ezetimibe (ZETIA) 10 mg tablet Take one tablet by mouth daily.   ? flecainide (  TAMBOCOR) 100 mg tablet Take one tablet by mouth twice daily. (Patient taking differently: Take 50 mg by mouth twice daily.)   ? FLUoxetine (PROZAC) 20 mg capsule Take three capsules by mouth daily. Indications: bulimia, major depressive disorder, posttraumatic stress syndrome   ? ketotifen (ZADITOR) 0.025 % (0.035 %) ophthalmic solution Place one drop into or around eye(s) twice daily as needed.   ? levothyroxine (SYNTHROID) 100 mcg tablet Take one tablet by mouth daily 30 minutes before breakfast.   ? magnesium oxide (MAG-OX) 400 mg (241.3 mg magnesium) tablet Take 400 mg by mouth daily.   ? olopatadine (PATANASE) 0.6 % nasal spray Apply one spray to two sprays to each nostril as directed twice daily as needed.   ? rOPINIRole (REQUIP) 0.5 mg tablet Take 0.5 mg by mouth at bedtime daily.   ? rosuvastatin (CRESTOR) 10 mg tablet Take one tablet by mouth daily.   ? zinc sulfate 220 mg (50 mg elemental zinc) capsule Take 220 mg by mouth daily.       Vitals:    10/14/20 0806 10/14/20 0849 10/14/20 1009   BP: 127/77 (!) 146/91 (!) 145/83   BP Source: Arm, Right Upper Arm, Right Upper Arm, Right Upper   Pulse: 67 65 59     There is no height or weight on file to calculate BMI.     Physical Exam  Vitals reviewed.   Constitutional:       General: She is not in acute distress.     Appearance: She is not ill-appearing.   HENT:      Head: Normocephalic and atraumatic.   Pulmonary:      Effort: Pulmonary effort is normal. No respiratory distress.   Neurological:      Mental Status: She is alert.      Gait: Gait normal.         Mental Status Evaluation:    General/Constitutional: Appears stated age, in personal attire, good hygiene and grooming.   Eye Contact: Fair  Behavior: Appropriate and cooperative  Speech: regular rate and rhythm, normal tone and volume.   Mood: Depressed  Affect: Dysphoric and Sullen, mood congruent  Thought Process: Linear, organized, easy to follow and understand.  Thought Content: Denies HI. Passive SI w/o plan or intent   Perception: Denies AVH. No evidence of paranoia, delusions, or illusions.   Associations: Intact  Insight/Judgment: Good/Good         Assessment and Plan:     Summary/Formulation:   Holly Maldonado is a 60 y.o. female with a history of MDD, PTSD, and bulimia who presents to  outpatient psychiatry for Spravato Administration    Problem   Severe Episode of Recurrent Major Depressive Disorder, Without Psychotic Features (Hcc)    10/14/2020   Stable-Chronic  -Current symptoms: SIGECAPS (9/9)   -Denies HI and AVH. Passive SI w/o plan or intent  PHQ-9  PHQ-9 Score: 27 (10/14/2020  8:09 AM)    Over the past 2 weeks, how often have you been bothered by any of the following problems?  Little interest or pleasure in doing things: (!) Nearly Every Day  Feeling down, depressed or hopeless: (!) Nearly Every Day  PHQ-2 Score: 6  If the score is > / = 3, proceed with the questions below:  Trouble falling asleep, staying asleep, or sleeping too much: Nearly Every Day  Feeling tired or having little energy: Nearly Every Day  Poor appetite or overeating: Nearly Every Day  Feeling bad about yourself -  or that you're a failure or have let  yourself or your family down: Nearly Every Day  Trouble concentrating on things, such as reading the newspaper or watching television: Nearly Every Day  Moving or speaking so slowly that other people could have noticed. Or, the opposite - being so fidgety or restless that you have been moving around a lot more than usual: Nearly Every Day  Thoughts that you would be better off dead or of hurting yourself in some way: Nearly Every Day  PHQ-9 Score: 27     Plan:  The following medications will be continued for the above symptoms:  -Continue Klonopin 2mg  PO QHS  -Continue Prozac 60mg  PO Daily  -Continue Spravato 84mg  IN Weekly              Post Discharge Instructions:  Do Not: Drive, Operate Heavy Machinery, or make major life decisions until the following day.   Contact the clinic with any side effects, questions, or concerns ([phone])    Rae Mar, MD    Patient identified as a high fall risk during pre-visit planning due to the potential for impaired balance during the administration and side effects of Spravato treatment. Although the patient will remain in the clinic and monitored under the supervision of myself and a RN for the entirety of the session, patient will be flagged as a high fall risk.     A high fall risk band was placed on the patient if they needed to leave the room during treatment and the room was marked with a yellow triangle.   The patient was kept in the lowest and safest position possible during the time he/she was present in the clinic.    The patient did not ambulate on their own while in the clinic aside from entering or exiting the clinic when deemed safe to go home. Any patient ambulation during the 2 hours procedure window was done with at least 1 nurse or physician assisting the patient.    This note was composed with assistance of Office manager.  There may be transcriptional errors. If you have questions regarding the note please reach out to Rae Mar, MD directly.

## 2020-10-14 NOTE — Patient Instructions
It was nice to see you in clinic today.  If questions arise, you can reach the clinic by calling [phone].    Actions from today's visit:  Post Discharge Instructions:  Do Not: Drive, Operate Heavy Machinery, or make major life decisions until the following day.   Contact the clinic with any side effects, questions, or concerns ([phone])    Dr. Alishia Lebo    Please direct medication refill requests to your pharmacy.    In the event of a safety concern or suicidal thoughts, call 911 or go to the nearest emergency room. National Suicide Prevention Lifeline 800-273-8255 (Talk). Crisis Text Hotline (text 741741).     The [facility] phone line is a resource for talk support in non-emergency situations (1-866-WARMEAR).

## 2020-10-15 ENCOUNTER — Encounter: Admit: 2020-10-15 | Discharge: 2020-10-15 | Payer: No Typology Code available for payment source

## 2020-10-15 ENCOUNTER — Ambulatory Visit: Admit: 2020-10-15 | Discharge: 2020-10-16 | Payer: No Typology Code available for payment source

## 2020-10-15 DIAGNOSIS — J309 Allergic rhinitis, unspecified: Secondary | ICD-10-CM

## 2020-10-20 NOTE — Progress Notes
Spravato (Esketamine) Note    Date of Service: 10/22/2020    Subjective:      Holly Maldonado is a 60 y.o. female  [id number]    Total of 120 minutes were spent on the same day of the visit including preparing to see the patient, obtaining history, performing a medically appropriate examination, counseling and educating the patient, observing and monitoring Spravato Treatment, documenting clinical information in the electronic health record and care coordination.     History of Present Illness   Holly Maldonado is a 60 y.o. female  w/ a pmh significant for MDD, PTSD, and bulimia. Who is seen today for Spravato Administration    Patient Information:  ? Spravato Charity fundraiser) Rx Verificaiton: Chad Tiznado 02/13/1961 10/22/2020 Yes   ? Spravato (Esketamine) Treatment #: 10  ? Spravato (Esketamine) Dose: 84mg        Pre-Treatment Assessment:  ? Last Food: > 2 Hours? Yes   ? Last Drink: > 30 Minutes? Yes   ? Last Nasal Spray: > 2 Hours? Yes   ? Spravato (Esketamine) Complications or Adverse Effects since last visit?: Lethargy and Detachment     Since her last visit, the pt reports continued depressed mood, anhedonia, anergy, sleep disturbances, appetite changes, feelings of worthlessness, poor concentration, PMR/PMA, and passive SI w/o plan or intent and thoughts of self harm. Pt denies HI and AVH. Continues to have significant PTSD symptom burden.     ? MADRS Score: 53    ? PHQ-9  PHQ-9 Score: 27 (10/22/2020 11:00 AM)    Over the past 2 weeks, how often have you been bothered by any of the following problems?  Little interest or pleasure in doing things: (!) Nearly Every Day  Feeling down, depressed or hopeless: (!) Nearly Every Day  PHQ-2 Score: 6  If the score is > / = 3, proceed with the questions below:  Trouble falling asleep, staying asleep, or sleeping too much: Nearly Every Day  Feeling tired or having little energy: Nearly Every Day  Poor appetite or overeating: Nearly Every Day  Feeling bad about yourself - or that you're a failure or have let  yourself or your family down: Nearly Every Day  Trouble concentrating on things, such as reading the newspaper or watching television: Nearly Every Day  Moving or speaking so slowly that other people could have noticed. Or, the opposite - being so fidgety or restless that you have been moving around a lot more than usual: Nearly Every Day  Thoughts that you would be better off dead or of hurting yourself in some way: Nearly Every Day  PHQ-9 Score: 27    ? HAM-D  Hamilton Rating Scale for Depression  Depressed Mood: Patient reports virtually only these feeling states in his/her spontaneous verbal and non-verbal communication  Feelings of Guilt: Present illness is a punishment. Delusions of guilt  Suicide: Wishes he/she were dead or any thoughts of possible death to self  Insomnia: Early in the Night: Complains of nightly difficulty falling asleep  Insomnia: Middle of the Night: Waking during the night - any getting out of bed rates 2 (except for purposes of voiding)  Insomnia: Early Hours of the Morning: Unable to fall asleep again if he/she gets out of bed  Work and Activities: Stopped working because of present illness. Rate 4 if patient engages in no activities except routine chores, or if patient fails to perform routine chores unassisted  Retardation: Slight retardation during the interview  Agitation: Fidgetiness  Anxiety Psychic: Apprehensive attitude apparent in face or speech  Anxiety Somatic: Moderate  Somatic Symptoms Gastro-Intestinal: Loss of appetite but eating without staff encouragement. Heavy feelings in abdomen  General Somatic Symptoms: Any clear-cut symptom rates 2  Genital Symptoms: Absent  Hypochondriasis: Frequent complaints, requests for help, etc.  Loss of Weight: Probable weight loss associated with present illness  Insight: Acknowledges illness but attributes cause to bad food, climate, overwork, virus, need for rest, etc.  HAM-D Total Score: 34      Intra-Treatment Assessment:  ? Spravato (Esketamine) Start Time: 11:08  ? Spravato (Esketamine) Blood Pressure Checks:  Vitals:    10/22/20 0108 10/22/20 1058 10/22/20 1148   BP: 124/68 134/87 (!) 150/96   BP Source: Arm, Right Upper Arm, Right Upper Arm, Right Upper   Pulse: 69 66 73     ? Spravato (Esketamine) Complications or Adverse Effects: Dissociation and Bad Taste or Sore Throat   ? Spravato (Esketamine) Recommendations For Next Administration: None   ? Spravato (Esketamine) Stop Time: 13:08      Post-Treatment and Discharge Assessment:  ? Mood Okay  ? Presence of SI/HI and AVH/Paranoia? Yes  mild paranoia felt safe to return home with assistance of family  ? Any Physical Complaints: No  ? Spravato REMS Patient Monitoring Form Submitted? Yes     Review of Systems   Constitutional: Positive for appetite change and fatigue. Negative for chills, fever and unexpected weight change.   HENT: Negative for congestion, postnasal drip and sore throat.    Gastrointestinal: Negative for abdominal pain, constipation, diarrhea, nausea and vomiting.   Neurological: Negative for light-headedness, numbness and headaches.   Psychiatric/Behavioral: Positive for decreased concentration, dysphoric mood and sleep disturbance. Negative for confusion, hallucinations, self-injury and suicidal ideas. The patient is nervous/anxious. The patient is not hyperactive.        Objective:         ? aspirin EC 81 mg tablet Take 81 mg by mouth daily. Every other day.   ? AUVI-Q 0.3 mg/0.3 mL auto-injector Inject 0.3 mg (1 Pen) into thigh if needed for anaphylactic reaction. May repeat in 5-15 minutes if needed.   ? Cholecalciferol (Vitamin D3) 1,000 unit cap Take 5,000 Units by mouth.   ? clonazePAM (KLONOPIN) 0.5 mg tablet Take 2 mg by mouth at bedtime daily.   ? clonazePAM (KLONOPIN) 2 mg tablet TAKE 1 TABLET BY MOUTH EVERY DAY AT BEDTIME   ? cyproheptadine (PERIACTIN) 4 mg tablet Take one tablet by mouth at bedtime daily.   ? docosahexaenoic acid/epa (FISH OIL PO) Take 3 capsules by mouth daily.   ? EPINEPHrine (EPIPEN 2-PAK) 1 mg/mL injection pen (2-Pack) Inject 0.3 mg (1 Pen) into thigh if needed for anaphylactic reaction. May repeat in 5-15 minutes if needed.   ? esketamine (SPRAVATO) 56 mg (28 mg x 2) nasal spray Insert four sprays into nose as directed twice weekly. Wait 5 minutes between use of each device.  Indications: additional treatment for major depressive disorder   ? [START ON 10/23/2020] esketamine (SPRAVATO) 84 mg (28 mg x 3) nasal spray Insert six sprays into nose as directed twice weekly. Wait 5 minutes between use of each device.  Indications: additional treatment for major depressive disorder   ? estradioL (CLIMARA) 0.075 mg/24 hr patch Apply one patch to top of skin as directed twice weekly.   ? ezetimibe (ZETIA) 10 mg tablet Take one tablet by mouth daily.   ? flecainide (TAMBOCOR) 100 mg tablet  Take one tablet by mouth twice daily. (Patient taking differently: Take 50 mg by mouth twice daily.)   ? FLUoxetine (PROZAC) 20 mg capsule Take three capsules by mouth daily. Indications: bulimia, major depressive disorder, posttraumatic stress syndrome   ? ketotifen (ZADITOR) 0.025 % (0.035 %) ophthalmic solution Place one drop into or around eye(s) twice daily as needed.   ? levothyroxine (SYNTHROID) 100 mcg tablet Take one tablet by mouth daily 30 minutes before breakfast.   ? magnesium oxide (MAG-OX) 400 mg (241.3 mg magnesium) tablet Take 400 mg by mouth daily.   ? olopatadine (PATANASE) 0.6 % nasal spray Apply one spray to two sprays to each nostril as directed twice daily as needed.   ? rOPINIRole (REQUIP) 0.5 mg tablet Take 0.5 mg by mouth at bedtime daily.   ? rosuvastatin (CRESTOR) 10 mg tablet Take one tablet by mouth daily.   ? zinc sulfate 220 mg (50 mg elemental zinc) capsule Take 220 mg by mouth daily.       Vitals:    10/22/20 0108 10/22/20 1058 10/22/20 1148   BP: 124/68 134/87 (!) 150/96   BP Source: Arm, Right Upper Arm, Right Upper Arm, Right Upper   Pulse: 69 66 73     There is no height or weight on file to calculate BMI.     Physical Exam  Vitals reviewed.   Constitutional:       General: She is not in acute distress.     Appearance: She is not ill-appearing.   HENT:      Head: Normocephalic and atraumatic.   Pulmonary:      Effort: Pulmonary effort is normal. No respiratory distress.   Neurological:      Mental Status: She is alert.      Gait: Gait normal.         Mental Status Evaluation:    General/Constitutional: Appears stated age, in personal attire, good hygiene and grooming.   Eye Contact: Fair  Behavior: Appropriate and cooperative  Speech: regular rate and rhythm, normal tone and volume.   Mood: Depressed  Affect: Sullen, mood congruent  Thought Process: Linear, organized, easy to follow and understand.  Thought Content: Denies HI. Passive SI w/o plan or intent   Perception: Denies AVH. No evidence of paranoia, delusions, or illusions.   Associations: Intact  Insight/Judgment: Good/Good         Assessment and Plan:     Summary/Formulation:   Holly Maldonado is a 60 y.o. female with a history of MDD, PTSD, and bulimia who presents to [facility] for Spravato Administration    Problem   Severe Episode of Recurrent Major Depressive Disorder, Without Psychotic Features (Hcc)    10/22/2020   Stable-Chronic  -Current symptoms: SIGECAPS (9/9)   -Denies HI and AVH. Passive SI w/o plan or intent  PHQ-9  PHQ-9 Score: 27 (10/22/2020 11:00 AM)    Over the past 2 weeks, how often have you been bothered by any of the following problems?  Little interest or pleasure in doing things: (!) Nearly Every Day  Feeling down, depressed or hopeless: (!) Nearly Every Day  PHQ-2 Score: 6  If the score is > / = 3, proceed with the questions below:  Trouble falling asleep, staying asleep, or sleeping too much: Nearly Every Day  Feeling tired or having little energy: Nearly Every Day  Poor appetite or overeating: Nearly Every Day  Feeling bad about yourself - or that you're a failure or have  let  yourself or your family down: Nearly Every Day  Trouble concentrating on things, such as reading the newspaper or watching television: Nearly Every Day  Moving or speaking so slowly that other people could have noticed. Or, the opposite - being so fidgety or restless that you have been moving around a lot more than usual: Nearly Every Day  Thoughts that you would be better off dead or of hurting yourself in some way: Nearly Every Day  PHQ-9 Score: 27     Plan:  The following medications will be continued for the above symptoms:  -Continue Klonopin 2mg  PO QHS  -Continue Prozac 60mg  PO Daily  -Continue Spravato 84mg  IN Weekly              Post Discharge Instructions:  Do Not: Drive, Operate Heavy Machinery, or make major life decisions until the following day.   Contact the clinic with any side effects, questions, or concerns ([phone])    Rae Mar, MD    Patient identified as a high fall risk during pre-visit planning due to the potential for impaired balance during the administration and side effects of Spravato treatment. Although the patient will remain in the clinic and monitored under the supervision of myself and a RN for the entirety of the session, patient will be flagged as a high fall risk.     A high fall risk band was placed on the patient if they needed to leave the room during treatment and the room was marked with a yellow triangle.   The patient was kept in the lowest and safest position possible during the time he/she was present in the clinic.    The patient did not ambulate on their own while in the clinic aside from entering or exiting the clinic when deemed safe to go home. Any patient ambulation during the 2 hours procedure window was done with at least 1 nurse or physician assisting the patient.    This note was composed with assistance of Office manager.  There may be transcriptional errors. If you have questions regarding the note please reach out to Rae Mar, MD directly.

## 2020-10-20 NOTE — Patient Instructions
It was nice to see you in clinic today.  If questions arise, you can reach the clinic by calling [phone].    Actions from today's visit:  Post Discharge Instructions:  Do Not: Drive, Operate Heavy Machinery, or make major life decisions until the following day.   Contact the clinic with any side effects, questions, or concerns ([phone])    Dr. Ukiah Trawick    Please direct medication refill requests to your pharmacy.    In the event of a safety concern or suicidal thoughts, call 911 or go to the nearest emergency room. National Suicide Prevention Lifeline 800-273-8255 (Talk). Crisis Text Hotline (text 741741).     The [facility] phone line is a resource for talk support in non-emergency situations (1-866-WARMEAR).

## 2020-10-21 ENCOUNTER — Encounter: Admit: 2020-10-21 | Discharge: 2020-10-21 | Payer: No Typology Code available for payment source

## 2020-10-21 MED ORDER — SPRAVATO 84 MG (28 MG X 3) NA SPRY
84 mg | NASAL | 5 refills | 4.00000 days | Status: AC
Start: 2020-10-21 — End: ?
  Filled 2020-10-22: qty 3, 4d supply, fill #1

## 2020-10-22 ENCOUNTER — Ambulatory Visit: Admit: 2020-10-22 | Discharge: 2020-10-22 | Payer: No Typology Code available for payment source

## 2020-10-22 ENCOUNTER — Encounter: Admit: 2020-10-22 | Discharge: 2020-10-22 | Payer: No Typology Code available for payment source

## 2020-10-22 DIAGNOSIS — F332 Major depressive disorder, recurrent severe without psychotic features: Secondary | ICD-10-CM

## 2020-10-22 MED ORDER — ESKETAMINE 84 MG (28 MG X 3) NA SPRY
84 mg | Freq: Once | NASAL | 0 refills | Status: CP
Start: 2020-10-22 — End: ?
  Administered 2020-10-22: 16:00:00 84 mg via NASAL

## 2020-10-22 NOTE — Progress Notes
Spravato Treatment Nursing Assessment    Intra-Treatment Assessment:   Spravato (Esketamine) Blood Pressure Checks:  Vitals:    10/22/20 0108 10/22/20 1058 10/22/20 1148   BP: 124/68 134/87 (!) 150/96   BP Source: Arm, Right Upper Arm, Right Upper Arm, Right Upper   Pulse: 69 66 73      Spravato (Esketamine) Complications or Adverse Effects: Dissociation, weakness      Post-Treatment and Discharge Assessment:   Mood "okay"   Presence of SI/HI and AVH/Paranoia? Yes , paranoia   Any Physical Complaints: No    Patient identified as a high fall risk during pre-visit planning due to the potential for impaired balance during the administration and side effects of Spravato treatment. Although the patient will remain in the clinic and monitored under the supervision of myself and a RN for the entirety of the session, patient will be flagged as a high fall risk.     A high fall risk band was placed on the patient if they needed to leave the room during treatment and the room was marked with a yellow triangle.   The patient was kept in the lowest and safest position possible during the time he/she was present in the clinic.    The patient did not ambulate on their own while in the clinic aside from entering or exiting the clinic when deemed safe to go home. Any patient ambulation during the 2 hours procedure window was done with at least 1 nurse or physician assisting the patient.

## 2020-10-24 ENCOUNTER — Encounter: Admit: 2020-10-24 | Discharge: 2020-10-24 | Payer: No Typology Code available for payment source

## 2020-10-24 ENCOUNTER — Ambulatory Visit: Admit: 2020-10-24 | Discharge: 2020-10-24 | Payer: No Typology Code available for payment source

## 2020-10-24 ENCOUNTER — Ambulatory Visit: Admit: 2020-10-24 | Discharge: 2020-10-25 | Payer: No Typology Code available for payment source

## 2020-10-24 DIAGNOSIS — F332 Major depressive disorder, recurrent severe without psychotic features: Secondary | ICD-10-CM

## 2020-10-24 DIAGNOSIS — F4312 Post-traumatic stress disorder, chronic: Secondary | ICD-10-CM

## 2020-10-24 DIAGNOSIS — F502 Bulimia nervosa: Secondary | ICD-10-CM

## 2020-10-24 DIAGNOSIS — J309 Allergic rhinitis, unspecified: Secondary | ICD-10-CM

## 2020-10-24 MED ORDER — FLUOXETINE 20 MG PO CAP
60 mg | ORAL_CAPSULE | Freq: Every day | ORAL | 2 refills | Status: AC
Start: 2020-10-24 — End: ?

## 2020-10-24 MED ORDER — CLONAZEPAM 2 MG PO TAB
2 mg | ORAL_TABLET | Freq: Every evening | ORAL | 0 refills | Status: AC
Start: 2020-10-24 — End: ?

## 2020-10-24 MED ORDER — LAMOTRIGINE 25 MG PO TAB
ORAL_TABLET | Freq: Every evening | ORAL | 0 refills | Status: AC
Start: 2020-10-24 — End: ?

## 2020-10-24 NOTE — Patient Instructions
Return to clinic in 2 months.  Please call the clinic to reschedule or cancel appointments if somethings changes.     If you need a medication refill, please call your pharmacy to request refills.     In the event of a safety concern or suicidal thoughts, call 911 or go to the nearest emergency room. National Suicide Prevention Lifeline 800-273-8255 (Talk). Crisis Text Hotline (text 741741).     The [facility] phone line is a resource for talk support in non-emergency situations (1-866-WARMEAR).    Please do not hesitate to call the clinic, if you have questions regarding your psychiatric medications or start to develop side effects to medications prescribed by your psychiatrist.

## 2020-10-25 ENCOUNTER — Encounter: Admit: 2020-10-25 | Discharge: 2020-10-25 | Payer: No Typology Code available for payment source

## 2020-10-26 NOTE — Progress Notes
Spravato (Esketamine) Note    Date of Service: 10/27/2020    Subjective:      Holly Maldonado is a 60 y.o. female  [id number]    Total of 120 minutes were spent on the same day of the visit including preparing to see the patient, obtaining history, performing a medically appropriate examination, counseling and educating the patient, observing and monitoring Spravato Treatment, documenting clinical information in the electronic health record and care coordination.     History of Present Illness   Holly Maldonado is a 60 y.o. female  w/ a pmh significant for MDD, PTSD, and bulimia. Who is seen today for Spravato Administration    Patient Information:  ? Spravato Charity fundraiser) Rx Verificaiton: Juanetta Negash [birth year] 10/27/2020 Yes   ? Spravato (Esketamine) Treatment #: 11  ? Spravato (Esketamine) Dose: 84mg        Pre-Treatment Assessment:  ? Last Food: > 2 Hours? Yes   ? Last Drink: > 30 Minutes? Yes   ? Last Nasal Spray: > 2 Hours? Yes   ? Spravato (Esketamine) Complications or Adverse Effects since last visit?: Lethargy and Detachment     Since her last visit, the pt reports persistent trauma symptoms and depressive symptoms. She notes significant depressed mood, anhedonia, anergy, appetite changes, sleep disturbances, poor concentration, PMA, feelings of worthlessness. She notes passive SI w/o plan or intent. She reports that she had significant urges to self harm once in the last week. She has worked with her therapists both eating disorder and trauma therapy and will start aggressive trauma therapy this week. She denies current HI or AVH.     ? MADRS Score: 53    ? PHQ-9  PHQ-9 Score: 27 (10/27/2020 10:52 AM)    Over the past 2 weeks, how often have you been bothered by any of the following problems?  Little interest or pleasure in doing things: (!) Nearly Every Day  Feeling down, depressed or hopeless: (!) Nearly Every Day  PHQ-2 Score: 6  If the score is > / = 3, proceed with the questions below:  Trouble falling asleep, staying asleep, or sleeping too much: Nearly Every Day  Feeling tired or having little energy: Nearly Every Day  Poor appetite or overeating: Nearly Every Day  Feeling bad about yourself - or that you're a failure or have let  yourself or your family down: Nearly Every Day  Trouble concentrating on things, such as reading the newspaper or watching television: Nearly Every Day  Moving or speaking so slowly that other people could have noticed. Or, the opposite - being so fidgety or restless that you have been moving around a lot more than usual: Nearly Every Day  Thoughts that you would be better off dead or of hurting yourself in some way: Nearly Every Day  PHQ-9 Score: 27    ? HAM-D  Hamilton Rating Scale for Depression  Depressed Mood: Patient reports virtually only these feeling states in his/her spontaneous verbal and non-verbal communication  Feelings of Guilt: Present illness is a punishment. Delusions of guilt  Suicide: Wishes he/she were dead or any thoughts of possible death to self  Insomnia: Early in the Night: Complains of nightly difficulty falling asleep  Insomnia: Middle of the Night: Waking during the night - any getting out of bed rates 2 (except for purposes of voiding)  Insomnia: Early Hours of the Morning: Unable to fall asleep again if he/she gets out of bed  Work and Activities:  Stopped working because of present illness. Rate 4 if patient engages in no activities except routine chores, or if patient fails to perform routine chores unassisted  Retardation: Slight retardation during the interview  Agitation: Fidgetiness  Anxiety Psychic: Apprehensive attitude apparent in face or speech  Anxiety Somatic: Moderate  Somatic Symptoms Gastro-Intestinal: Loss of appetite but eating without staff encouragement. Heavy feelings in abdomen  General Somatic Symptoms: Any clear-cut symptom rates 2  Genital Symptoms: Absent  Hypochondriasis: Frequent complaints, requests for help, etc.  Loss of Weight: Probable weight loss associated with present illness  Insight: Acknowledges illness but attributes cause to bad food, climate, overwork, virus, need for rest, etc.  HAM-D Total Score: 34      Intra-Treatment Assessment:  ? Spravato (Esketamine) Start Time: 11:05  ? Spravato (Esketamine) Blood Pressure Checks:  Vitals:    10/27/20 1050 10/27/20 1145 10/27/20 1315   BP: 130/81 (!) 149/88 (!) 143/84   BP Source: Arm, Right Upper Arm, Right Upper Arm, Right Upper   Pulse: 66 69 65     ? Spravato (Esketamine) Complications or Adverse Effects: Dissociation and Bad Taste or Sore Throat   ? Spravato (Esketamine) Recommendations For Next Administration: None   ? Spravato (Esketamine) Stop Time: 13:05      Post-Treatment and Discharge Assessment:  ? Mood I feel blah  ? Presence of SI/HI and AVH/Paranoia? No   ? Any Physical Complaints: No  ? Spravato REMS Patient Monitoring Form Submitted? Yes     Review of Systems   Constitutional: Positive for appetite change and fatigue. Negative for chills, fever and unexpected weight change.   HENT: Negative for congestion, postnasal drip and sore throat.    Gastrointestinal: Negative for abdominal pain, constipation, diarrhea, nausea and vomiting.   Neurological: Negative for light-headedness, numbness and headaches.   Psychiatric/Behavioral: Positive for decreased concentration, dysphoric mood and sleep disturbance. Negative for confusion, hallucinations, self-injury and suicidal ideas. The patient is nervous/anxious. The patient is not hyperactive.        Objective:         ? aspirin EC 81 mg tablet Take 81 mg by mouth daily. Every other day.   ? AUVI-Q 0.3 mg/0.3 mL auto-injector Inject 0.3 mg (1 Pen) into thigh if needed for anaphylactic reaction. May repeat in 5-15 minutes if needed.   ? Cholecalciferol (Vitamin D3) 1,000 unit cap Take 5,000 Units by mouth.   ? clonazePAM (KLONOPIN) 0.5 mg tablet Take 2 mg by mouth at bedtime daily.   ? clonazePAM (KLONOPIN) 2 mg tablet Take one tablet by mouth at bedtime daily.   ? cyproheptadine (PERIACTIN) 4 mg tablet Take one tablet by mouth at bedtime daily.   ? docosahexaenoic acid/epa (FISH OIL PO) Take 3 capsules by mouth daily.   ? EPINEPHrine (EPIPEN 2-PAK) 1 mg/mL injection pen (2-Pack) Inject 0.3 mg (1 Pen) into thigh if needed for anaphylactic reaction. May repeat in 5-15 minutes if needed.   ? esketamine (SPRAVATO) 56 mg (28 mg x 2) nasal spray Insert four sprays into nose as directed twice weekly. Wait 5 minutes between use of each device.  Indications: additional treatment for major depressive disorder   ? esketamine (SPRAVATO) 84 mg (28 mg x 3) nasal spray Insert six sprays into nose as directed twice weekly. Wait 5 minutes between use of each device.  Indications: additional treatment for major depressive disorder   ? estradioL (CLIMARA) 0.075 mg/24 hr patch Apply one patch to top of skin as directed twice weekly.   ?  ezetimibe (ZETIA) 10 mg tablet Take one tablet by mouth daily.   ? flecainide (TAMBOCOR) 100 mg tablet Take one tablet by mouth twice daily. (Patient taking differently: Take 50 mg by mouth twice daily.)   ? FLUoxetine (PROZAC) 20 mg capsule Take three capsules by mouth daily. Indications: bulimia, major depressive disorder, posttraumatic stress syndrome   ? ketotifen (ZADITOR) 0.025 % (0.035 %) ophthalmic solution Place one drop into or around eye(s) twice daily as needed.   ? lamoTRIgine (LAMICTAL) 25 mg tablet Take one tablet by mouth at bedtime daily for 14 days, THEN two tablets at bedtime daily for 14 days, THEN three tablets at bedtime daily for 14 days, THEN four tablets at bedtime daily for 48 days.   ? levothyroxine (SYNTHROID) 100 mcg tablet Take one tablet by mouth daily 30 minutes before breakfast.   ? magnesium oxide (MAG-OX) 400 mg (241.3 mg magnesium) tablet Take 400 mg by mouth daily.   ? olopatadine (PATANASE) 0.6 % nasal spray Apply one spray to two sprays to each nostril as directed twice daily as needed.   ? rOPINIRole (REQUIP) 0.5 mg tablet Take 0.5 mg by mouth at bedtime daily.   ? rosuvastatin (CRESTOR) 10 mg tablet Take one tablet by mouth daily.   ? zinc sulfate 220 mg (50 mg elemental zinc) capsule Take 220 mg by mouth daily.       Vitals:    10/27/20 1050 10/27/20 1145 10/27/20 1315   BP: 130/81 (!) 149/88 (!) 143/84   BP Source: Arm, Right Upper Arm, Right Upper Arm, Right Upper   Pulse: 66 69 65     There is no height or weight on file to calculate BMI.     Physical Exam  Vitals reviewed.   Constitutional:       General: She is not in acute distress.     Appearance: She is not ill-appearing.   HENT:      Head: Normocephalic and atraumatic.   Pulmonary:      Effort: Pulmonary effort is normal. No respiratory distress.   Neurological:      Mental Status: She is alert.      Gait: Gait normal.         Mental Status Evaluation:    General/Constitutional: Appears stated age, in personal attire, good hygiene and grooming.   Eye Contact: Fair  Behavior: Appropriate and cooperative  Speech: regular rate and rhythm, normal tone and volume.   Mood: Down  Affect: Sullen, mood congruent  Thought Process: Linear, organized, easy to follow and understand.  Thought Content: Denies HI. Passive SI w/o plan or intent   Perception: Denies AVH. No evidence of paranoia, delusions, or illusions.   Associations: Intact  Insight/Judgment: Good/Good         Assessment and Plan:     Summary/Formulation:   Holly Maldonado is a 60 y.o. female with a history of MDD, PTSD, and bulimia who presents to [place] outpatient [facility] for Spravato Administration    Problem   Severe Episode of Recurrent Major Depressive Disorder, Without Psychotic Features (Hcc)    10/27/2020   Stable-Chronic  -Current symptoms: SIGECAPS (9/9)   -Denies HI and AVH. Passive SI w/o plan or intent  PHQ-9  PHQ-9 Score: 27 (10/27/2020 10:52 AM)    Over the past 2 weeks, how often have you been bothered by any of the following problems?  Little interest or pleasure in doing things: (!) Nearly Every Day  Feeling down, depressed or hopeless: Marland Kitchen)  Nearly Every Day  PHQ-2 Score: 6  If the score is > / = 3, proceed with the questions below:  Trouble falling asleep, staying asleep, or sleeping too much: Nearly Every Day  Feeling tired or having little energy: Nearly Every Day  Poor appetite or overeating: Nearly Every Day  Feeling bad about yourself - or that you're a failure or have let  yourself or your family down: Nearly Every Day  Trouble concentrating on things, such as reading the newspaper or watching television: Nearly Every Day  Moving or speaking so slowly that other people could have noticed. Or, the opposite - being so fidgety or restless that you have been moving around a lot more than usual: Nearly Every Day  Thoughts that you would be better off dead or of hurting yourself in some way: Nearly Every Day  PHQ-9 Score: 27     Plan:  The following medications will be continued for the above symptoms:  -Continue Klonopin 2mg  PO QHS  -Continue Prozac 60mg  PO Daily  -Continue Spravato 84mg  IN Weekly              Post Discharge Instructions:  Do Not: Drive, Operate Heavy Machinery, or make major life decisions until the following day.   Contact the clinic with any side effects, questions, or concerns ([phone])    Rae Mar, MD    Patient identified as a high fall risk during pre-visit planning due to the potential for impaired balance during the administration and side effects of Spravato treatment. Although the patient will remain in the clinic and monitored under the supervision of myself and a RN for the entirety of the session, patient will be flagged as a high fall risk.     A high fall risk band was placed on the patient if they needed to leave the room during treatment and the room was marked with a yellow triangle.   The patient was kept in the lowest and safest position possible during the time he/she was present in the clinic.    The patient did not ambulate on their own while in the clinic aside from entering or exiting the clinic when deemed safe to go home. Any patient ambulation during the 2 hours procedure window was done with at least 1 nurse or physician assisting the patient.    This note was composed with assistance of Office manager.  There may be transcriptional errors. If you have questions regarding the note please reach out to Rae Mar, MD directly.

## 2020-10-26 NOTE — Patient Instructions
It was nice to see you in clinic today.  If questions arise, you can reach the clinic by calling [phone].    Actions from today's visit:  Post Discharge Instructions:  Do Not: Drive, Operate Heavy Machinery, or make major life decisions until the following day.   Contact the clinic with any side effects, questions, or concerns ([phone])    Dr. Aamari Strawderman    Please direct medication refill requests to your pharmacy.    In the event of a safety concern or suicidal thoughts, call 911 or go to the nearest emergency room. National Suicide Prevention Lifeline 800-273-8255 (Talk). Crisis Text Hotline (text 741741).     The [facility] phone line is a resource for talk support in non-emergency situations (1-866-WARMEAR).

## 2020-10-27 ENCOUNTER — Encounter: Admit: 2020-10-27 | Discharge: 2020-10-27 | Payer: No Typology Code available for payment source

## 2020-10-27 ENCOUNTER — Ambulatory Visit: Admit: 2020-10-27 | Discharge: 2020-10-27 | Payer: No Typology Code available for payment source

## 2020-10-27 DIAGNOSIS — Q615 Medullary cystic kidney: Secondary | ICD-10-CM

## 2020-10-27 DIAGNOSIS — F32A Depressed: Secondary | ICD-10-CM

## 2020-10-27 DIAGNOSIS — Z8659 Personal history of other mental and behavioral disorders: Secondary | ICD-10-CM

## 2020-10-27 DIAGNOSIS — F332 Major depressive disorder, recurrent severe without psychotic features: Secondary | ICD-10-CM

## 2020-10-27 DIAGNOSIS — F4310 Post-traumatic stress disorder, unspecified: Secondary | ICD-10-CM

## 2020-10-27 DIAGNOSIS — F99 Mental disorder, not otherwise specified: Secondary | ICD-10-CM

## 2020-10-27 DIAGNOSIS — E039 Hypothyroidism, unspecified: Secondary | ICD-10-CM

## 2020-10-27 DIAGNOSIS — R000 Tachycardia, unspecified: Secondary | ICD-10-CM

## 2020-10-27 DIAGNOSIS — N390 Urinary tract infection, site not specified: Secondary | ICD-10-CM

## 2020-10-27 DIAGNOSIS — I499 Cardiac arrhythmia, unspecified: Secondary | ICD-10-CM

## 2020-10-27 DIAGNOSIS — G4730 Sleep apnea, unspecified: Secondary | ICD-10-CM

## 2020-10-27 DIAGNOSIS — E785 Hyperlipidemia, unspecified: Secondary | ICD-10-CM

## 2020-10-27 DIAGNOSIS — E7800 Pure hypercholesterolemia, unspecified: Secondary | ICD-10-CM

## 2020-10-27 MED ORDER — ESKETAMINE 84 MG (28 MG X 3) NA SPRY
84 mg | Freq: Once | NASAL | 0 refills | Status: CP
Start: 2020-10-27 — End: ?
  Administered 2020-10-27: 16:00:00 84 mg via NASAL

## 2020-10-27 MED FILL — SPRAVATO 84 MG (28 MG X 3) NA SPRY: 84 mg (28 mg x 3) | NASAL | 4 days supply | Qty: 3 | Fill #2 | Status: CP

## 2020-10-30 ENCOUNTER — Encounter: Admit: 2020-10-30 | Discharge: 2020-10-30 | Payer: No Typology Code available for payment source

## 2020-10-31 ENCOUNTER — Encounter: Admit: 2020-10-31 | Discharge: 2020-10-31 | Payer: No Typology Code available for payment source

## 2020-10-31 ENCOUNTER — Ambulatory Visit: Admit: 2020-10-31 | Discharge: 2020-11-01 | Payer: No Typology Code available for payment source

## 2020-10-31 DIAGNOSIS — J309 Allergic rhinitis, unspecified: Secondary | ICD-10-CM

## 2020-11-02 NOTE — Progress Notes
Spravato (Esketamine) Note    Date of Service: 11/03/2020    Subjective:      Holly Maldonado is a 60 y.o. female  [id number]    Total of 124 minutes were spent on the same day of the visit including preparing to see the patient, obtaining history, performing a medically appropriate examination, counseling and educating the patient, observing and monitoring Spravato Treatment, documenting clinical information in the electronic health record and care coordination.     History of Present Illness   Holly Maldonado is a 60 y.o. female  w/ a pmh significant for MDD, PTSD, and bulimia. Who is seen today for Spravato Administration    Patient Information:  ? Spravato Charity fundraiser) Rx Verificaiton: Holly Maldonado 1961-04-11 11/03/2020 Yes   ? Spravato (Esketamine) Treatment #: 12  ? Spravato (Esketamine) Dose: 84mg        Pre-Treatment Assessment:  ? Last Food: > 2 Hours? Yes   ? Last Drink: > 30 Minutes? Yes   ? Last Nasal Spray: > 2 Hours? Yes   ? Spravato (Esketamine) Complications or Adverse Effects since last visit?: Lethargy and Detachment     Since her last visit, the pt reports that she feels like she had a breakthrough with her PTSD and trauma symptoms since starting trauma therapy. She continues to have depressed mood, anhedonia, feelings of guilt and worthlessness. She notes anergy, poor concentration, appetite changes and sleep disturbances. She also continues to have Passive SI w/o plan or intent. She denies HI and AVH. She notes improvement in her PMR and feels as though she is starting to open up more.     ? MADRS Score: 50    ? PHQ-9  PHQ-9 Score: 25 (11/03/2020 11:09 AM)    Over the past 2 weeks, how often have you been bothered by any of the following problems?  Little interest or pleasure in doing things: (!) Nearly Every Day  Feeling down, depressed or hopeless: (!) Nearly Every Day  PHQ-2 Score: 6  If the score is > / = 3, proceed with the questions below:  Trouble falling asleep, staying asleep, or sleeping too much: Nearly Every Day  Feeling tired or having little energy: Nearly Every Day  Poor appetite or overeating: Nearly Every Day  Feeling bad about yourself - or that you're a failure or have let  yourself or your family down: Nearly Every Day  Trouble concentrating on things, such as reading the newspaper or watching television: Nearly Every Day  Moving or speaking so slowly that other people could have noticed. Or, the opposite - being so fidgety or restless that you have been moving around a lot more than usual: Several Days  Thoughts that you would be better off dead or of hurting yourself in some way: Nearly Every Day  PHQ-9 Score: 25    ? HAM-D  Hamilton Rating Scale for Depression  Depressed Mood: Patient reports virtually only these feeling states in his/her spontaneous verbal and non-verbal communication  Feelings of Guilt: Present illness is a punishment. Delusions of guilt  Suicide: Wishes he/she were dead or any thoughts of possible death to self  Insomnia: Early in the Night: Complains of nightly difficulty falling asleep  Insomnia: Middle of the Night: Waking during the night - any getting out of bed rates 2 (except for purposes of voiding)  Insomnia: Early Hours of the Morning: Unable to fall asleep again if he/she gets out of bed  Work and Activities:  Stopped working because of present illness. Rate 4 if patient engages in no activities except routine chores, or if patient fails to perform routine chores unassisted  Retardation: Slight retardation during the interview  Agitation: Fidgetiness  Anxiety Psychic: Apprehensive attitude apparent in face or speech  Anxiety Somatic: Moderate  Somatic Symptoms Gastro-Intestinal: Loss of appetite but eating without staff encouragement. Heavy feelings in abdomen  General Somatic Symptoms: Any clear-cut symptom rates 2  Genital Symptoms: Absent  Hypochondriasis: Frequent complaints, requests for help, etc.  Loss of Weight: Probable weight loss associated with present illness  Insight: Acknowledges being depressed and ill  HAM-D Total Score: 33      Intra-Treatment Assessment:  ? Spravato (Esketamine) Start Time: 11:06  ? Spravato (Esketamine) Blood Pressure Checks:  Vitals:    11/03/20 1102 11/03/20 1146 11/03/20 1306   BP: 125/78 (!) 143/92 130/80   BP Source: Arm, Right Upper Arm, Right Upper Arm, Right Upper   Pulse: 64 60 59     ? Spravato (Esketamine) Complications or Adverse Effects: Dissociation and Bad Taste or Sore Throat   ? Spravato (Esketamine) Recommendations For Next Administration: None   ? Spravato (Esketamine) Stop Time: 13:06      Post-Treatment and Discharge Assessment:  ? Mood Tired  ? Presence of SI/HI and AVH/Paranoia? No   ? Any Physical Complaints: No  ? Spravato REMS Patient Monitoring Form Submitted? Yes     Review of Systems   Constitutional: Positive for appetite change and fatigue. Negative for chills, fever and unexpected weight change.   HENT: Negative for congestion, postnasal drip and sore throat.    Gastrointestinal: Negative for abdominal pain, constipation, diarrhea, nausea and vomiting.   Neurological: Negative for light-headedness, numbness and headaches.   Psychiatric/Behavioral: Positive for decreased concentration, dysphoric mood and sleep disturbance. Negative for confusion, hallucinations, self-injury and suicidal ideas. The patient is nervous/anxious. The patient is not hyperactive.        Objective:         ? aspirin EC 81 mg tablet Take 81 mg by mouth daily. Every other day.   ? AUVI-Q 0.3 mg/0.3 mL auto-injector Inject 0.3 mg (1 Pen) into thigh if needed for anaphylactic reaction. May repeat in 5-15 minutes if needed.   ? Cholecalciferol (Vitamin D3) 1,000 unit cap Take 5,000 Units by mouth.   ? clonazePAM (KLONOPIN) 0.5 mg tablet Take 2 mg by mouth at bedtime daily.   ? clonazePAM (KLONOPIN) 2 mg tablet Take one tablet by mouth at bedtime daily.   ? cyproheptadine (PERIACTIN) 4 mg tablet Take one tablet by mouth at bedtime daily.   ? docosahexaenoic acid/epa (FISH OIL PO) Take 3 capsules by mouth daily.   ? EPINEPHrine (EPIPEN 2-PAK) 1 mg/mL injection pen (2-Pack) Inject 0.3 mg (1 Pen) into thigh if needed for anaphylactic reaction. May repeat in 5-15 minutes if needed.   ? esketamine (SPRAVATO) 56 mg (28 mg x 2) nasal spray Insert four sprays into nose as directed twice weekly. Wait 5 minutes between use of each device.  Indications: additional treatment for major depressive disorder   ? esketamine (SPRAVATO) 84 mg (28 mg x 3) nasal spray Insert six sprays into nose as directed twice weekly. Wait 5 minutes between use of each device.  Indications: additional treatment for major depressive disorder   ? estradioL (CLIMARA) 0.075 mg/24 hr patch Apply one patch to top of skin as directed twice weekly.   ? ezetimibe (ZETIA) 10 mg tablet Take one tablet by mouth  daily.   ? flecainide (TAMBOCOR) 100 mg tablet Take one tablet by mouth twice daily. (Patient taking differently: Take 50 mg by mouth twice daily.)   ? FLUoxetine (PROZAC) 20 mg capsule Take three capsules by mouth daily. Indications: bulimia, major depressive disorder, posttraumatic stress syndrome   ? ketotifen (ZADITOR) 0.025 % (0.035 %) ophthalmic solution Place one drop into or around eye(s) twice daily as needed.   ? lamoTRIgine (LAMICTAL) 25 mg tablet Take one tablet by mouth at bedtime daily for 14 days, THEN two tablets at bedtime daily for 14 days, THEN three tablets at bedtime daily for 14 days, THEN four tablets at bedtime daily for 48 days.   ? levothyroxine (SYNTHROID) 100 mcg tablet Take one tablet by mouth daily 30 minutes before breakfast.   ? magnesium oxide (MAG-OX) 400 mg (241.3 mg magnesium) tablet Take 400 mg by mouth daily.   ? olopatadine (PATANASE) 0.6 % nasal spray Apply one spray to two sprays to each nostril as directed twice daily as needed.   ? rOPINIRole (REQUIP) 0.5 mg tablet Take 0.5 mg by mouth at bedtime daily.   ? rosuvastatin (CRESTOR) 10 mg tablet Take one tablet by mouth daily.   ? zinc sulfate 220 mg (50 mg elemental zinc) capsule Take 220 mg by mouth daily.       Vitals:    11/03/20 1102 11/03/20 1146 11/03/20 1306   BP: 125/78 (!) 143/92 130/80   BP Source: Arm, Right Upper Arm, Right Upper Arm, Right Upper   Pulse: 64 60 59     There is no height or weight on file to calculate BMI.     Physical Exam  Vitals reviewed.   Constitutional:       General: She is not in acute distress.     Appearance: She is not ill-appearing.   HENT:      Head: Normocephalic and atraumatic.   Pulmonary:      Effort: Pulmonary effort is normal. No respiratory distress.   Neurological:      Mental Status: She is alert.      Gait: Gait normal.         Mental Status Evaluation:    General/Constitutional: Appears stated age, in personal attire, good hygiene and grooming.   Eye Contact: Fair  Behavior: Appropriate and cooperative  Speech: regular rate and rhythm, normal tone and volume.   Mood: A little better  Affect: Content, mood congruent  Thought Process: Linear, organized, easy to follow and understand.  Thought Content: Denies HI. Passive SI w/o plan or intent   Perception: Denies AVH. No evidence of paranoia, delusions, or illusions.   Associations: Intact  Insight/Judgment: Good/God         Assessment and Plan:     Summary/Formulation:   Holly Maldonado is a 60 y.o. female with a history of MDD, PTSD, and bulimia who presents to [facility] for Spravato Administration    Problem   Severe Episode of Recurrent Major Depressive Disorder, Without Psychotic Features (Hcc)    11/03/2020   Stable-Chronic  -Current symptoms: SIGECAPS (9/9)   -Denies HI and AVH. Passive SI w/o plan or intent  PHQ-9  PHQ-9 Score: 25 (11/03/2020 11:09 AM)    Over the past 2 weeks, how often have you been bothered by any of the following problems?  Little interest or pleasure in doing things: (!) Nearly Every Day  Feeling down, depressed or hopeless: (!) Nearly Every Day  PHQ-2 Score: 6  If the score is > / = 3, proceed with the questions below:  Trouble falling asleep, staying asleep, or sleeping too much: Nearly Every Day  Feeling tired or having little energy: Nearly Every Day  Poor appetite or overeating: Nearly Every Day  Feeling bad about yourself - or that you're a failure or have let  yourself or your family down: Nearly Every Day  Trouble concentrating on things, such as reading the newspaper or watching television: Nearly Every Day  Moving or speaking so slowly that other people could have noticed. Or, the opposite - being so fidgety or restless that you have been moving around a lot more than usual: Several Days  Thoughts that you would be better off dead or of hurting yourself in some way: Nearly Every Day  PHQ-9 Score: 25     Plan:  The following medications will be continued for the above symptoms:  -Continue Klonopin 2mg  PO QHS  -Continue Prozac 60mg  PO Daily  -Continue Spravato 84mg  IN Weekly              Post Discharge Instructions:  Do Not: Drive, Operate Heavy Machinery, or make major life decisions until the following day.   Contact the clinic with any side effects, questions, or concerns ([phone])    Rae Mar, MD    Patient identified as a high fall risk during pre-visit planning due to the potential for impaired balance during the administration and side effects of Spravato treatment. Although the patient will remain in the clinic and monitored under the supervision of myself and a RN for the entirety of the session, patient will be flagged as a high fall risk.     A high fall risk band was placed on the patient if they needed to leave the room during treatment and the room was marked with a yellow triangle.   The patient was kept in the lowest and safest position possible during the time he/she was present in the clinic.    The patient did not ambulate on their own while in the clinic aside from entering or exiting the clinic when deemed safe to go home. Any patient ambulation during the 2 hours procedure window was done with at least 1 nurse or physician assisting the patient.    This note was composed with assistance of Office manager.  There may be transcriptional errors. If you have questions regarding the note please reach out to Rae Mar, MD directly.

## 2020-11-02 NOTE — Patient Instructions
It was nice to see you in clinic today.  If questions arise, you can reach the clinic by calling [phone].    Actions from today's visit:  Post Discharge Instructions:  Do Not: Drive, Operate Heavy Machinery, or make major life decisions until the following day.   Contact the clinic with any side effects, questions, or concerns ([phone])    Dr. Vira Chaplin    Please direct medication refill requests to your pharmacy.    In the event of a safety concern or suicidal thoughts, call 911 or go to the nearest emergency room. National Suicide Prevention Lifeline 800-273-8255 (Talk). Crisis Text Hotline (text 741741).     The [facility] phone line is a resource for talk support in non-emergency situations (1-866-WARMEAR).

## 2020-11-03 ENCOUNTER — Ambulatory Visit: Admit: 2020-11-03 | Discharge: 2020-11-03 | Payer: No Typology Code available for payment source

## 2020-11-03 ENCOUNTER — Encounter: Admit: 2020-11-03 | Discharge: 2020-11-03 | Payer: No Typology Code available for payment source

## 2020-11-03 DIAGNOSIS — I499 Cardiac arrhythmia, unspecified: Secondary | ICD-10-CM

## 2020-11-03 DIAGNOSIS — J309 Allergic rhinitis, unspecified: Secondary | ICD-10-CM

## 2020-11-03 DIAGNOSIS — Q615 Medullary cystic kidney: Secondary | ICD-10-CM

## 2020-11-03 DIAGNOSIS — G4730 Sleep apnea, unspecified: Secondary | ICD-10-CM

## 2020-11-03 DIAGNOSIS — R000 Tachycardia, unspecified: Secondary | ICD-10-CM

## 2020-11-03 DIAGNOSIS — E7800 Pure hypercholesterolemia, unspecified: Secondary | ICD-10-CM

## 2020-11-03 DIAGNOSIS — F4310 Post-traumatic stress disorder, unspecified: Secondary | ICD-10-CM

## 2020-11-03 DIAGNOSIS — F332 Major depressive disorder, recurrent severe without psychotic features: Secondary | ICD-10-CM

## 2020-11-03 DIAGNOSIS — Z8659 Personal history of other mental and behavioral disorders: Secondary | ICD-10-CM

## 2020-11-03 DIAGNOSIS — N390 Urinary tract infection, site not specified: Secondary | ICD-10-CM

## 2020-11-03 DIAGNOSIS — E7841 Elevated Lipoprotein(a): Secondary | ICD-10-CM

## 2020-11-03 DIAGNOSIS — E039 Hypothyroidism, unspecified: Secondary | ICD-10-CM

## 2020-11-03 DIAGNOSIS — F99 Mental disorder, not otherwise specified: Secondary | ICD-10-CM

## 2020-11-03 DIAGNOSIS — E785 Hyperlipidemia, unspecified: Secondary | ICD-10-CM

## 2020-11-03 DIAGNOSIS — F32A Depressed: Secondary | ICD-10-CM

## 2020-11-03 MED ORDER — ESKETAMINE 84 MG (28 MG X 3) NA SPRY
84 mg | Freq: Once | NASAL | 0 refills | Status: CP
Start: 2020-11-03 — End: ?
  Administered 2020-11-03: 16:00:00 84 mg via NASAL

## 2020-11-03 MED FILL — SPRAVATO 84 MG (28 MG X 3) NA SPRY: 84 mg (28 mg x 3) | NASAL | 4 days supply | Qty: 3 | Fill #3 | Status: CP

## 2020-11-03 NOTE — Progress Notes
Spravato Treatment Nursing Assessment    Intra-Treatment Assessment:   Spravato (Esketamine) Blood Pressure Checks:  Vitals:    11/03/20 1102 11/03/20 1146 11/03/20 1306   BP: 125/78 (!) 143/92 130/80   BP Source: Arm, Right Upper Arm, Right Upper Arm, Right Upper   Pulse: 64 60 59      Spravato (Esketamine) Complications or Adverse Effects: Dissociation      Post-Treatment and Discharge Assessment:   Mood "tired"   Presence of SI/HI and AVH/Paranoia? No   Any Physical Complaints: No    Patient identified as a high fall risk during pre-visit planning due to the potential for impaired balance during the administration and side effects of Spravato treatment. Although the patient will remain in the clinic and monitored under the supervision of myself and a RN for the entirety of the session, patient will be flagged as a high fall risk.     A high fall risk band was placed on the patient if they needed to leave the room during treatment and the room was marked with a yellow triangle.   The patient was kept in the lowest and safest position possible during the time he/she was present in the clinic.    The patient did not ambulate on their own while in the clinic aside from entering or exiting the clinic when deemed safe to go home. Any patient ambulation during the 2 hours procedure window was done with at least 1 nurse or physician assisting the patient.

## 2020-11-06 ENCOUNTER — Encounter: Admit: 2020-11-06 | Discharge: 2020-11-06 | Payer: No Typology Code available for payment source

## 2020-11-06 ENCOUNTER — Ambulatory Visit: Admit: 2020-11-06 | Discharge: 2020-11-07 | Payer: No Typology Code available for payment source

## 2020-11-06 DIAGNOSIS — J309 Allergic rhinitis, unspecified: Secondary | ICD-10-CM

## 2020-11-11 ENCOUNTER — Ambulatory Visit: Admit: 2020-11-11 | Discharge: 2020-11-11 | Payer: No Typology Code available for payment source

## 2020-11-11 ENCOUNTER — Encounter: Admit: 2020-11-11 | Discharge: 2020-11-11 | Payer: No Typology Code available for payment source

## 2020-11-11 DIAGNOSIS — Q615 Medullary cystic kidney: Secondary | ICD-10-CM

## 2020-11-11 DIAGNOSIS — E785 Hyperlipidemia, unspecified: Secondary | ICD-10-CM

## 2020-11-11 DIAGNOSIS — F32A Depressed: Secondary | ICD-10-CM

## 2020-11-11 DIAGNOSIS — R000 Tachycardia, unspecified: Secondary | ICD-10-CM

## 2020-11-11 DIAGNOSIS — M26621 Arthralgia of right temporomandibular joint: Secondary | ICD-10-CM

## 2020-11-11 DIAGNOSIS — H903 Sensorineural hearing loss, bilateral: Secondary | ICD-10-CM

## 2020-11-11 DIAGNOSIS — H9313 Tinnitus, bilateral: Secondary | ICD-10-CM

## 2020-11-11 DIAGNOSIS — F4310 Post-traumatic stress disorder, unspecified: Secondary | ICD-10-CM

## 2020-11-11 DIAGNOSIS — E039 Hypothyroidism, unspecified: Secondary | ICD-10-CM

## 2020-11-11 DIAGNOSIS — I499 Cardiac arrhythmia, unspecified: Secondary | ICD-10-CM

## 2020-11-11 DIAGNOSIS — G4730 Sleep apnea, unspecified: Secondary | ICD-10-CM

## 2020-11-11 DIAGNOSIS — N390 Urinary tract infection, site not specified: Secondary | ICD-10-CM

## 2020-11-11 DIAGNOSIS — F99 Mental disorder, not otherwise specified: Secondary | ICD-10-CM

## 2020-11-11 DIAGNOSIS — J309 Allergic rhinitis, unspecified: Secondary | ICD-10-CM

## 2020-11-11 DIAGNOSIS — E7800 Pure hypercholesterolemia, unspecified: Secondary | ICD-10-CM

## 2020-11-11 DIAGNOSIS — Z8659 Personal history of other mental and behavioral disorders: Secondary | ICD-10-CM

## 2020-11-11 NOTE — Patient Instructions
Plackers over the counter mouth guard nightly.

## 2020-11-11 NOTE — Progress Notes
Subjective:       History of Present Illness  Holly Maldonado is a 60 y.o. female.      Right > left.   Noise in right ear.  October.  Typical sound.     None prior.      Sleeping fine.  Avoids silence.       Issues with hearing.          Allergies    Started shots recently.    RR and NC a lot.     Meds - 1 + 2.      Lost taste of smell.  Covid Sept 20.      Denies otologic events including infection, drainage, or surgery.     Problem last few years....   Fluid in ears.     Balance fine    TMJ   - not using.   Doesn't have.     PMH   New treatment for depression - not dental pain         Review of Systems      Objective:         ? aspirin EC 81 mg tablet Take 81 mg by mouth daily. Every other day.   ? AUVI-Q 0.3 mg/0.3 mL auto-injector Inject 0.3 mg (1 Pen) into thigh if needed for anaphylactic reaction. May repeat in 5-15 minutes if needed.   ? Cholecalciferol (Vitamin D3) 1,000 unit cap Take 5,000 Units by mouth.   ? clonazePAM (KLONOPIN) 0.5 mg tablet Take 2 mg by mouth at bedtime daily.   ? clonazePAM (KLONOPIN) 2 mg tablet Take one tablet by mouth at bedtime daily.   ? cyproheptadine (PERIACTIN) 4 mg tablet Take one tablet by mouth at bedtime daily.   ? docosahexaenoic acid/epa (FISH OIL PO) Take 3 capsules by mouth daily.   ? EPINEPHrine (EPIPEN 2-PAK) 1 mg/mL injection pen (2-Pack) Inject 0.3 mg (1 Pen) into thigh if needed for anaphylactic reaction. May repeat in 5-15 minutes if needed.   ? esketamine (SPRAVATO) 56 mg (28 mg x 2) nasal spray Insert four sprays into nose as directed twice weekly. Wait 5 minutes between use of each device.  Indications: additional treatment for major depressive disorder   ? esketamine (SPRAVATO) 84 mg (28 mg x 3) nasal spray Insert six sprays into nose as directed twice weekly. Wait 5 minutes between use of each device.  Indications: additional treatment for major depressive disorder   ? estradioL (CLIMARA) 0.075 mg/24 hr patch Apply one patch to top of skin as directed twice weekly.   ? ezetimibe (ZETIA) 10 mg tablet Take one tablet by mouth daily.   ? flecainide (TAMBOCOR) 100 mg tablet Take one tablet by mouth twice daily. (Patient taking differently: Take 50 mg by mouth twice daily.)   ? FLUoxetine (PROZAC) 20 mg capsule Take three capsules by mouth daily. Indications: bulimia, major depressive disorder, posttraumatic stress syndrome   ? ketotifen (ZADITOR) 0.025 % (0.035 %) ophthalmic solution Place one drop into or around eye(s) twice daily as needed.   ? lamoTRIgine (LAMICTAL) 25 mg tablet Take one tablet by mouth at bedtime daily for 14 days, THEN two tablets at bedtime daily for 14 days, THEN three tablets at bedtime daily for 14 days, THEN four tablets at bedtime daily for 48 days.   ? levothyroxine (SYNTHROID) 100 mcg tablet Take one tablet by mouth daily 30 minutes before breakfast.   ? magnesium oxide (MAG-OX) 400 mg (241.3 mg magnesium) tablet Take 400  mg by mouth daily.   ? olopatadine (PATANASE) 0.6 % nasal spray Apply one spray to two sprays to each nostril as directed twice daily as needed.   ? rOPINIRole (REQUIP) 0.5 mg tablet Take 0.5 mg by mouth at bedtime daily.   ? rosuvastatin (CRESTOR) 10 mg tablet Take one tablet by mouth daily.   ? zinc sulfate 220 mg (50 mg elemental zinc) capsule Take 220 mg by mouth daily.     Vitals:    11/11/20 0836   BP: 111/79   Pulse: 69   PainSc: Zero   Weight: 87.5 kg (193 lb)   Height: 162.6 cm (5' 4)     Body mass index is 33.13 kg/m?Marland Kitchen     Physical Exam  Constitutional:       Appearance: She is well-developed.   HENT:      Head: Normocephalic.      Right Ear: Hearing, tympanic membrane, ear canal and external ear normal.      Left Ear: Hearing, tympanic membrane, ear canal and external ear normal.      Nose: Nose normal.      Mouth/Throat:      Pharynx: Uvula midline.   Neck:      Thyroid: No thyromegaly.      Trachea: Phonation normal. No tracheal tenderness or tracheal deviation.   Cardiovascular:      Comments: No JVD noted.  Pulmonary:      Effort: Pulmonary effort is normal.   Musculoskeletal:      Cervical back: Normal range of motion.   Psychiatric:         Thought Content: Thought content normal.      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         Assessment and Plan:  Impresssion is tinnitus in her right ear as well as some component in the left ear.  She does have some bilateral high-frequency sensorineural hearing loss which of course can produce tinnitus as the main cause.  She also has a complicated picture with eustachian tube dysfunction which is currently under treatment in the medicine department which can play a role by producing a conductive hearing loss and making an tinnitus more evident.      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      The other concern is her major depression which of course impairs coping mechanism and may be a role in this. I suggested she mentioned these symptoms to her psychiatric care team.     We discussed these several factors but she does seem to tolerate this fairly well and is already doing the most important treatment which is avoiding silence.  At this time I think it be fine to get a hearing test in 2 to 3 years or before as needed.   Happy to see in followup.  Her concerns about sinus and midface issues would best be covered by Dr Tivis Ringer whom she has seen before.

## 2020-11-17 ENCOUNTER — Ambulatory Visit: Admit: 2020-11-17 | Discharge: 2020-11-17 | Payer: No Typology Code available for payment source

## 2020-11-17 ENCOUNTER — Encounter: Admit: 2020-11-17 | Discharge: 2020-11-17 | Payer: No Typology Code available for payment source

## 2020-11-17 ENCOUNTER — Ambulatory Visit: Admit: 2020-11-17 | Discharge: 2020-11-18 | Payer: No Typology Code available for payment source

## 2020-11-17 DIAGNOSIS — J309 Allergic rhinitis, unspecified: Secondary | ICD-10-CM

## 2020-11-17 DIAGNOSIS — E7841 Elevated Lipoprotein(a): Secondary | ICD-10-CM

## 2020-11-20 ENCOUNTER — Encounter: Admit: 2020-11-20 | Discharge: 2020-11-20 | Payer: No Typology Code available for payment source

## 2020-11-21 ENCOUNTER — Encounter: Admit: 2020-11-21 | Discharge: 2020-11-21 | Payer: No Typology Code available for payment source

## 2020-11-21 ENCOUNTER — Ambulatory Visit: Admit: 2020-11-21 | Discharge: 2020-11-22 | Payer: No Typology Code available for payment source

## 2020-11-21 ENCOUNTER — Ambulatory Visit: Admit: 2020-11-21 | Discharge: 2020-11-21 | Payer: No Typology Code available for payment source

## 2020-11-21 DIAGNOSIS — J309 Allergic rhinitis, unspecified: Secondary | ICD-10-CM

## 2020-11-21 NOTE — Telephone Encounter
Received call from patient on restarting Spravato maintenance. Scheduled patient for Spravato treatment.

## 2020-11-21 NOTE — Progress Notes
Telehealth Visit Note    Date of Service: 11/21/2020    Subjective:      Obtained patient's verbal consent to treat them and their agreement to [facility] financial policy and NPP via this telehealth visit during the [facility]       Holly Maldonado is a 60 y.o. female.    History of Present Illness    She is doing well.  Since Dr. Carolan Shiver gave her the green light to start allergy shots she's been getting them with our office.  She's not on beta blocker.  She's getting allergy shots with 2 extracts and is on 1:10 yellow vials, and next injection will be first of 1:1 red top vials.  Allergy shots are tolerated without bothersome large local reactions that she wishes to make changes to schedule for.  No systemic reactions.  She's taking Zyrtec, olopatadine, and ketotifen eye drops.  She wonders if it's OK to reduce her medicines?  No uncontrolled allergy symptoms at this time.  She's had no bothersome side effects of medications.  She desires to take less medication if possible.  She's had no recurrence of dyspnea.         Review of Systems      Objective:         ? aspirin EC 81 mg tablet Take 81 mg by mouth daily. Every other day.   ? AUVI-Q 0.3 mg/0.3 mL auto-injector Inject 0.3 mg (1 Pen) into thigh if needed for anaphylactic reaction. May repeat in 5-15 minutes if needed.   ? Cholecalciferol (Vitamin D3) 1,000 unit cap Take 5,000 Units by mouth.   ? clonazePAM (KLONOPIN) 0.5 mg tablet Take 2 mg by mouth at bedtime daily.   ? clonazePAM (KLONOPIN) 2 mg tablet Take one tablet by mouth at bedtime daily.   ? cyproheptadine (PERIACTIN) 4 mg tablet Take one tablet by mouth at bedtime daily.   ? docosahexaenoic acid/epa (FISH OIL PO) Take 3 capsules by mouth daily.   ? EPINEPHrine (EPIPEN 2-PAK) 1 mg/mL injection pen (2-Pack) Inject 0.3 mg (1 Pen) into thigh if needed for anaphylactic reaction. May repeat in 5-15 minutes if needed.   ? esketamine (SPRAVATO) 56 mg (28 mg x 2) nasal spray Insert four sprays into nose as directed twice weekly. Wait 5 minutes between use of each device.  Indications: additional treatment for major depressive disorder   ? esketamine (SPRAVATO) 84 mg (28 mg x 3) nasal spray Insert six sprays into nose as directed twice weekly. Wait 5 minutes between use of each device.  Indications: additional treatment for major depressive disorder   ? estradioL (CLIMARA) 0.075 mg/24 hr patch Apply one patch to top of skin as directed twice weekly.   ? ezetimibe (ZETIA) 10 mg tablet Take one tablet by mouth daily.   ? flecainide (TAMBOCOR) 100 mg tablet Take one tablet by mouth twice daily. (Patient taking differently: Take 50 mg by mouth twice daily.)   ? FLUoxetine (PROZAC) 20 mg capsule Take three capsules by mouth daily. Indications: bulimia, major depressive disorder, posttraumatic stress syndrome   ? ketotifen (ZADITOR) 0.025 % (0.035 %) ophthalmic solution Place one drop into or around eye(s) twice daily as needed.   ? lamoTRIgine (LAMICTAL) 25 mg tablet Take one tablet by mouth at bedtime daily for 14 days, THEN two tablets at bedtime daily for 14 days, THEN three tablets at bedtime daily for 14 days, THEN four tablets at bedtime daily for 48 days.   ?  levothyroxine (SYNTHROID) 100 mcg tablet Take one tablet by mouth daily 30 minutes before breakfast.   ? magnesium oxide (MAG-OX) 400 mg (241.3 mg magnesium) tablet Take 400 mg by mouth daily.   ? olopatadine (PATANASE) 0.6 % nasal spray Apply one spray to two sprays to each nostril as directed twice daily as needed.   ? rOPINIRole (REQUIP) 0.5 mg tablet Take 0.5 mg by mouth at bedtime daily.   ? rosuvastatin (CRESTOR) 10 mg tablet Take one tablet by mouth daily.   ? zinc sulfate 220 mg (50 mg elemental zinc) capsule Take 220 mg by mouth daily.         Computed Telehealth Body Mass Index unavailable. Necessary lab results were not found in the last year.    Physical Exam  Constitutional:       General: She is not in acute distress.  Pulmonary:      Effort: No respiratory distress.   Neurological:      Mental Status: She is alert.              Assessment and Plan:    Problem   Allergic Rhinoconjunctivitis    12-12-17 total IgE within normal limits   IgE class II or greater to: 2 dust mite species  05-25-18 aeroallergen SPT positive to 2 cockroach species, 2 dust mite species, and 2 molds with appropriate controls  05-25-18 aeroallergen ID positive to 2 trees, 2 grasses, 1 weed, cat, dog, 1 mold with appropriate controls    Continue aeroallergen avoidance measures.  Continue saline sinus rinses daily PRN using distilled water to mix with saline packets.  She may use Patanase to 1-2p en BID PRN and Zaditor to 1 dr Nolon Bussing eye BID PRN.  She does not have to use daily if not having daily symptoms.  She may change Zyrtec to 10mg  daily PRN.  Encouraged to take on shot days as may help with prevention of large local reactions at injection sites.  At this time, she'd like to continue allergy shots.    Potential adverse effects of medications were discussed and all questions were answered to the best of my ability previously.                            Return in about 3 months (around 02/21/2021).    15 minutes spent on this patient's encounter with counseling and coordination of care taking >50% of the visit.      Thank you for allowing [name] to participate in the care of this patient.  Please feel free to contact us if there are any questions or concerns about the patient.    [name], MD  Assistant Professor of Allergy & Clinical Immunology  Department of Internal Medicine   [facility]

## 2020-11-25 ENCOUNTER — Encounter: Admit: 2020-11-25 | Discharge: 2020-11-25 | Payer: No Typology Code available for payment source

## 2020-11-26 ENCOUNTER — Encounter: Admit: 2020-11-26 | Discharge: 2020-11-26 | Payer: No Typology Code available for payment source

## 2020-11-27 MED FILL — SPRAVATO 84 MG (28 MG X 3) NA SPRY: 84 mg (28 mg x 3) | NASAL | 4 days supply | Qty: 3 | Fill #4 | Status: AC

## 2020-11-28 ENCOUNTER — Ambulatory Visit: Admit: 2020-11-28 | Discharge: 2020-11-29 | Payer: No Typology Code available for payment source

## 2020-11-28 ENCOUNTER — Encounter: Admit: 2020-11-28 | Discharge: 2020-11-28 | Payer: No Typology Code available for payment source

## 2020-11-28 DIAGNOSIS — J309 Allergic rhinitis, unspecified: Secondary | ICD-10-CM

## 2020-12-05 ENCOUNTER — Encounter: Admit: 2020-12-05 | Discharge: 2020-12-05 | Payer: No Typology Code available for payment source

## 2020-12-11 ENCOUNTER — Encounter: Admit: 2020-12-11 | Discharge: 2020-12-11 | Payer: No Typology Code available for payment source

## 2020-12-11 ENCOUNTER — Ambulatory Visit: Admit: 2020-12-11 | Discharge: 2020-12-11 | Payer: No Typology Code available for payment source

## 2020-12-11 DIAGNOSIS — I499 Cardiac arrhythmia, unspecified: Secondary | ICD-10-CM

## 2020-12-11 DIAGNOSIS — F4310 Post-traumatic stress disorder, unspecified: Secondary | ICD-10-CM

## 2020-12-11 DIAGNOSIS — N390 Urinary tract infection, site not specified: Secondary | ICD-10-CM

## 2020-12-11 DIAGNOSIS — F99 Mental disorder, not otherwise specified: Secondary | ICD-10-CM

## 2020-12-11 DIAGNOSIS — F32A Depressed: Secondary | ICD-10-CM

## 2020-12-11 DIAGNOSIS — Q615 Medullary cystic kidney: Secondary | ICD-10-CM

## 2020-12-11 DIAGNOSIS — E039 Hypothyroidism, unspecified: Secondary | ICD-10-CM

## 2020-12-11 DIAGNOSIS — E7800 Pure hypercholesterolemia, unspecified: Secondary | ICD-10-CM

## 2020-12-11 DIAGNOSIS — Z8659 Personal history of other mental and behavioral disorders: Secondary | ICD-10-CM

## 2020-12-11 DIAGNOSIS — G4730 Sleep apnea, unspecified: Secondary | ICD-10-CM

## 2020-12-11 DIAGNOSIS — E785 Hyperlipidemia, unspecified: Secondary | ICD-10-CM

## 2020-12-11 DIAGNOSIS — R000 Tachycardia, unspecified: Secondary | ICD-10-CM

## 2020-12-12 ENCOUNTER — Ambulatory Visit: Admit: 2020-12-12 | Discharge: 2020-12-13 | Payer: No Typology Code available for payment source

## 2020-12-12 ENCOUNTER — Encounter: Admit: 2020-12-12 | Discharge: 2020-12-12 | Payer: No Typology Code available for payment source

## 2020-12-12 DIAGNOSIS — Z516 Encounter for desensitization to allergens: Secondary | ICD-10-CM

## 2020-12-13 ENCOUNTER — Encounter: Admit: 2020-12-13 | Discharge: 2020-12-13 | Payer: No Typology Code available for payment source

## 2020-12-16 ENCOUNTER — Encounter: Admit: 2020-12-16 | Discharge: 2020-12-16 | Payer: No Typology Code available for payment source

## 2020-12-16 MED ORDER — CLONAZEPAM 2 MG PO TAB
ORAL_TABLET | Freq: Every day | 0 refills
Start: 2020-12-16 — End: ?

## 2020-12-19 ENCOUNTER — Encounter: Admit: 2020-12-19 | Discharge: 2020-12-19 | Payer: No Typology Code available for payment source

## 2020-12-19 ENCOUNTER — Ambulatory Visit: Admit: 2020-12-19 | Discharge: 2020-12-19 | Payer: No Typology Code available for payment source

## 2020-12-19 DIAGNOSIS — Z516 Encounter for desensitization to allergens: Secondary | ICD-10-CM

## 2020-12-19 NOTE — Progress Notes
10 mg Cetrizine given per Dr Justus Memory for localized pain and larger weal at injection sites.

## 2020-12-19 NOTE — Telephone Encounter
Received VM from patient reporting that she had an allergy shot this morning at 10am and is now coughing, wheezing, is short of breath and finding it hard to breathe, is feeling sleepy, her arm where she got the shot is "killing her" and her tongue and lips are swelling. Asks for call back as soon as possible with advice. Note that patient is audibly short of breath and gasping at this time in the message.    Called patient back and directed her to administer EpiPen immediately and then call 911 or have someone drive her to the Emergency Room for evaluation right away. Patient states she will give herself EpiPen now and will have someone in the home drive her to the Emergency room right away.    Spoke with nurse who administered injection who reports that patient did have a large wheal after shot administration, so Dr. Justus Memory directed to give '10mg'$  Zyrtec prior to patient leaving the clinic.    Notified Dr. Meryl Crutch of patient's call and anaphylaxis symptoms. Dr. Meryl Crutch directed that patient should administer EpiPen and go to Emergency Room immediately. Also states that patient may take 4 puffs of albuterol but should not take anything by mouth at this time due to throat swelling. Directs that patient may use 2nd EpiPen 5-15 minutes after 1st pen if she is still experiencing anaphylaxis symptoms. Will call patient to check on her in the next few days.    Called patient back to deliver Dr. Valeta Harms recommendations of albuterol, nothing by mouth and 2nd EpiPen administration. Patient states she gave herself Epi and is on the way to the Emergency Room now. She does not have albuterol with her but states she will "probably have to" give herself the 2nd pen soon. Directed patient to tell ER staff that she has given 1 (or more) rounds of EpiPen.

## 2020-12-19 NOTE — Telephone Encounter
Received call from patient's daughter who is at the ER with patient. She states that the ER doctor at [facility], Dr. Wardell Heath, needs to speak with Dr. Meryl Crutch for recommendations of care. Asked that Dr. Meryl Crutch call him at [phone] at earliest convenience.     Gave Dr. Meryl Crutch message and she called physician immediately.

## 2020-12-19 NOTE — Telephone Encounter
I telephoned Mrs. Holly Maldonado to follow up message I received from my nurse of her suspected allergic reaction and inquire how she is currently doing.  Her daughter answered and notified me that they are at the ED and checking in now.  Her daughter notified me that Mrs. Holly Maldonado used her EpiPen and the swelling is better and she is talking better.  Her daughter said she gave her Benadryl.  I did not get a chance to speak directly with Mrs. Holly Maldonado during this telephone call as she was in the process of getting checked in at the ED, but her daughter notified me that she was improving so far.  I thanked her for this information.  I expressed concern for Mrs. Holly Maldonado and glad to hear she is improving so far.  I let her daughter know that I would be reaching out to Mrs. Holly Maldonado in the near future again to try to follow up with her again.  Her daughter had no questions or concerns.

## 2020-12-22 ENCOUNTER — Encounter: Admit: 2020-12-22 | Discharge: 2020-12-22 | Payer: No Typology Code available for payment source

## 2020-12-24 ENCOUNTER — Ambulatory Visit: Admit: 2020-12-24 | Discharge: 2020-12-24 | Payer: No Typology Code available for payment source

## 2020-12-24 ENCOUNTER — Encounter: Admit: 2020-12-24 | Discharge: 2020-12-24 | Payer: No Typology Code available for payment source

## 2020-12-24 DIAGNOSIS — F502 Bulimia nervosa: Secondary | ICD-10-CM

## 2020-12-24 DIAGNOSIS — F4312 Post-traumatic stress disorder, chronic: Secondary | ICD-10-CM

## 2020-12-24 DIAGNOSIS — F332 Major depressive disorder, recurrent severe without psychotic features: Secondary | ICD-10-CM

## 2020-12-24 MED ORDER — CLONAZEPAM 2 MG PO TAB
2 mg | ORAL_TABLET | Freq: Every evening | ORAL | 2 refills | Status: AC | PRN
Start: 2020-12-24 — End: ?

## 2020-12-24 MED ORDER — LAMOTRIGINE 25 MG PO TAB
25 mg | ORAL_TABLET | Freq: Every evening | ORAL | 0 refills | Status: AC
Start: 2020-12-24 — End: ?

## 2020-12-24 MED ORDER — LAMOTRIGINE 100 MG PO TAB
100 mg | ORAL_TABLET | Freq: Every evening | ORAL | 0 refills | Status: AC
Start: 2020-12-24 — End: ?

## 2020-12-24 MED ORDER — CYPROHEPTADINE 4 MG PO TAB
ORAL_TABLET | Freq: Every evening | ORAL | 0 refills | Status: AC
Start: 2020-12-24 — End: ?

## 2020-12-24 MED ORDER — FLUOXETINE 20 MG PO CAP
60 mg | ORAL_CAPSULE | Freq: Every day | ORAL | 0 refills | Status: AC
Start: 2020-12-24 — End: ?
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
## 2020-12-24 NOTE — Progress Notes
ATTENDING NOTE  I saw and evaluated Holly Maldonado, discussed with Elvina Mattes, DO and concur with the assessment and treatment plan. Patient is 60 y.o. female with MDD and PTSD. Pt reports no real change in symptoms in the context of undergoing Spravato treatment. Denies SI/HI and AVH and no other safety concerns. Pt reports no medication side effects.    PLAN:  The following medication changes were made during this visit to better treat the above symptoms:   Increase lamotrigine to '125mg'$  daily   Continue fluoxetine '60mg'$  qam    Continue clonazepam 2 mg p.o. nightly   Restart cyproheptadine at '4mg'$  qhs x3 days then increase to '8mg'$  qhs   No labs needed    Leslie Andrea, MD  12/24/2020

## 2020-12-24 NOTE — Telephone Encounter
-----   Message from Hilton Cork, RN sent at 12/24/2020  8:33 AM CDT -----  Regarding: FW: Allergy Shots    ----- Message -----  From: Carroll Kinds  Sent: 12/23/2020   9:10 PM CDT  To: Allergy Im Nurse [facility]  Subject: Allergy Shots                                    You can call me anytime on Wednesday. I have an allergy shot scheduled for 3 pm so I assume I need to cancel that one?    My number is [phone]    Thanks     Ki

## 2020-12-24 NOTE — Patient Instructions
Increase lamictal to '125mg'$  daily for mood symtpoms   Re-start cyproheptadine '4mg'$  nightly for three days followed, by increase to 8 mg nightly for nightmares  Continue prozac '60mg'$  daily for mood symptoms   Continue klonopin '2mg'$  nightly for insomnia   Continue therapy!     Return to clinic in 2 months.  Please call the clinic to reschedule or cancel appointments if somethings changes.     If you need a medication refill, please call your pharmacy to request refills.     In the event of a safety concern or suicidal thoughts, call 911 or go to the nearest emergency room. [facility] [phone] (Talk). Crisis Text Hotline (text [phone]).     Please do not hesitate to call the clinic, if you have questions regarding your psychiatric medications or start to develop side effects to medications prescribed by your psychiatrist.     It was good seeing you!   Dr. Elvina Mattes, Psychiatry Resident

## 2020-12-24 NOTE — Telephone Encounter
I telephoned patient to follow up her symptoms from last week and discuss further. Per patient's MyChart message, I could call her anytime today (Wednesday) at [phone].    There was no answer at that number.   I left voicemail with my name, location, contact information, and to return call at their convenience.   No patient PHI was left on the message.

## 2020-12-25 ENCOUNTER — Encounter: Admit: 2020-12-25 | Discharge: 2020-12-25 | Payer: No Typology Code available for payment source

## 2020-12-26 ENCOUNTER — Encounter: Admit: 2020-12-26 | Discharge: 2020-12-26 | Payer: No Typology Code available for payment source

## 2020-12-26 MED ORDER — EPINEPHRINE 0.3 MG/0.3 ML IJ ATIN
INTRAMUSCULAR | 0 refills | 30.00000 days | Status: AC
Start: 2020-12-26 — End: ?

## 2020-12-26 MED ORDER — FLUNISOLIDE 25 MCG (0.025 %) NA SPRY
1 | Freq: Two times a day (BID) | NASAL | 0 refills | 25.00000 days | Status: AC
Start: 2020-12-26 — End: ?

## 2020-12-26 NOTE — Telephone Encounter
-----   Message from Hilton Cork, RN sent at 12/25/2020  8:08 AM CDT -----  Regarding: FW: Allergy Shots     ----- Message -----  From: Carroll Kinds  Sent: 12/24/2020   6:29 PM CDT  To: Allergy Im Nurse [facility]  Subject: Allergy Shots                                    Im so sorry I missed your call on Wednesday.     You can call me on Thursday but I have an appointment from 10 to 10:30 an appointment at 1 to 2 pm and a 3 to 4 pm appointment.    Thanks so much     Maudie Mercury    [phone]

## 2020-12-29 ENCOUNTER — Encounter: Admit: 2020-12-29 | Discharge: 2020-12-29 | Payer: No Typology Code available for payment source

## 2020-12-29 ENCOUNTER — Ambulatory Visit: Admit: 2020-12-29 | Discharge: 2020-12-29 | Payer: No Typology Code available for payment source

## 2020-12-29 DIAGNOSIS — Q615 Medullary cystic kidney: Secondary | ICD-10-CM

## 2020-12-29 DIAGNOSIS — I499 Cardiac arrhythmia, unspecified: Secondary | ICD-10-CM

## 2020-12-29 DIAGNOSIS — R300 Dysuria: Secondary | ICD-10-CM

## 2020-12-29 DIAGNOSIS — E785 Hyperlipidemia, unspecified: Secondary | ICD-10-CM

## 2020-12-29 DIAGNOSIS — N390 Urinary tract infection, site not specified: Principal | ICD-10-CM

## 2020-12-29 DIAGNOSIS — F99 Mental disorder, not otherwise specified: Secondary | ICD-10-CM

## 2020-12-29 DIAGNOSIS — J309 Allergic rhinitis, unspecified: Secondary | ICD-10-CM

## 2020-12-29 DIAGNOSIS — R000 Tachycardia, unspecified: Secondary | ICD-10-CM

## 2020-12-29 DIAGNOSIS — G4730 Sleep apnea, unspecified: Secondary | ICD-10-CM

## 2020-12-29 DIAGNOSIS — E039 Hypothyroidism, unspecified: Secondary | ICD-10-CM

## 2020-12-29 DIAGNOSIS — E7800 Pure hypercholesterolemia, unspecified: Secondary | ICD-10-CM

## 2020-12-29 DIAGNOSIS — F32A Depressed: Secondary | ICD-10-CM

## 2020-12-29 DIAGNOSIS — F4310 Post-traumatic stress disorder, unspecified: Secondary | ICD-10-CM

## 2020-12-29 DIAGNOSIS — Z8659 Personal history of other mental and behavioral disorders: Secondary | ICD-10-CM

## 2020-12-29 MED ORDER — NITROFURANTOIN MONOHYD/M-CRYST 100 MG PO CAP
100 mg | ORAL_CAPSULE | Freq: Two times a day (BID) | ORAL | 0 refills | 7.00000 days | Status: AC
Start: 2020-12-29 — End: ?

## 2020-12-29 NOTE — Progress Notes
Date of Service: 12/29/2020    Holly Maldonado is a 60 y.o. female.  DOB: 11/05/1960  MRN: [id number]     Subjective:             60 year old female presents with 3 days of urinary discomfort at opening of urethra. States she hasn't had a UTI since pregnancy. States she has been drinking a lot more iced coffee lately increasing her caffeine consumption. Denies increased frequency, urgency, or hematuria. States she has taken azo without improvement of symptoms.     Additionally states that over the weekend she started developing congestion, ear pressure L>R. States she was cleaning this weekend and around a dog and symptoms feel similar to typical allergy symptoms. States she takes zyrtec twice daily and uses a nasal spray. Has a second nasal spray pending pick up. States she has been getting allergy shots her allergies are so severe at times. Denies shortness of breath but reports and intermittent feeling of difficulty moving air. States she has a home covid test if symptoms start feeling different than an allergic rhinitis exacerbation.        Review of Systems   Constitutional: Negative for chills, fatigue and fever.   HENT: Positive for congestion, ear pain, postnasal drip, rhinorrhea and sore throat. Negative for ear discharge, sinus pressure, sinus pain and sneezing.    Respiratory: Negative for cough and shortness of breath.    Cardiovascular: Negative for chest pain.   Genitourinary: Positive for dysuria and pelvic pain. Negative for flank pain, frequency, hematuria and urgency.   Neurological: Negative for headaches.   Hematological: Negative for adenopathy. Does not bruise/bleed easily.     Objective:         ? aspirin EC 81 mg tablet Take 81 mg by mouth daily. Every other day.   ? AUVI-Q 0.3 mg/0.3 mL auto-injector Inject 0.3 mg (1 Pen) into thigh if needed for anaphylactic reaction. May repeat in 5-15 minutes if needed.   ? Cholecalciferol (Vitamin D3) 1,000 unit cap Take 5,000 Units by mouth.   ? [START ON 01/16/2021] clonazePAM (KLONOPIN) 2 mg tablet Take one tablet by mouth at bedtime as needed for up to 90 days.   ? cyproheptadine (PERIACTIN) 4 mg tablet Take one tablet by mouth at bedtime daily for 3 days, THEN two tablets at bedtime daily for 30 days.   ? docosahexaenoic acid/epa (FISH OIL PO) Take 3 capsules by mouth daily.   ? EPINEPHrine (EPIPEN 2-PAK) 1 mg/mL injection pen (2-Pack) Inject 0.3 mg (1 Pen) into thigh if needed for anaphylactic reaction. May repeat in 5-15 minutes if needed.   ? esketamine (SPRAVATO) 56 mg (28 mg x 2) nasal spray Insert four sprays into nose as directed twice weekly. Wait 5 minutes between use of each device.  Indications: additional treatment for major depressive disorder   ? esketamine (SPRAVATO) 84 mg (28 mg x 3) nasal spray Insert six sprays into nose as directed twice weekly. Wait 5 minutes between use of each device.  Indications: additional treatment for major depressive disorder   ? estradioL (CLIMARA) 0.075 mg/24 hr patch Apply one patch to top of skin as directed twice weekly.   ? ezetimibe (ZETIA) 10 mg tablet Take one tablet by mouth daily.   ? flecainide (TAMBOCOR) 100 mg tablet Take one tablet by mouth twice daily. (Patient taking differently: Take 50 mg by mouth twice daily.)   ? flunisolide 25 mcg (0.025 %) nasal spray Apply one spray to each  nostril as directed twice daily.   ? FLUoxetine (PROZAC) 20 mg capsule Take three capsules by mouth daily for 90 days. Indications: bulimia, major depressive disorder, posttraumatic stress syndrome   ? ketotifen (ZADITOR) 0.025 % (0.035 %) ophthalmic solution Place one drop into or around eye(s) twice daily as needed.   ? lamoTRIgine (LAMICTAL) 100 mg tablet Take one tablet by mouth at bedtime daily for 90 days. Take with lamictal 25mg  nightly for TOTAL DAILY DOSE of 125mg  nightly.   ? lamoTRIgine (LAMICTAL) 25 mg tablet Take one tablet by mouth at bedtime daily for 90 days. Take with lamictal 100mg  nightly for TOTAL DAILY DOSE of 125mg  nightly.   ? levothyroxine (SYNTHROID) 100 mcg tablet Take one tablet by mouth daily 30 minutes before breakfast.   ? magnesium oxide (MAG-OX) 400 mg (241.3 mg magnesium) tablet Take 400 mg by mouth daily.   ? nitrofurantoin monohyd/m-cryst (MACROBID) 100 mg capsule Take one capsule by mouth every 12 hours. Take with food.  Indications: genitourinary tract infections   ? olopatadine (PATANASE) 0.6 % nasal spray Apply one spray to two sprays to each nostril as directed twice daily as needed.   ? rOPINIRole (REQUIP) 0.5 mg tablet Take 0.5 mg by mouth at bedtime daily.   ? rosuvastatin (CRESTOR) 10 mg tablet Take one tablet by mouth daily.   ? zinc sulfate 220 mg (50 mg elemental zinc) capsule Take 220 mg by mouth daily.     Vitals:    12/29/20 1050   BP: 123/73   Pulse: 81   Temp: 36.8 ?C (98.3 ?F)   SpO2: 94%   Weight: 88.5 kg (195 lb)     Body mass index is 33.47 kg/m?Marland Kitchen     Physical Exam  Vitals reviewed.   Constitutional:       Appearance: Normal appearance.   HENT:      Head: Normocephalic and atraumatic.      Right Ear: Ear canal and external ear normal. There is no impacted cerumen. Tympanic membrane is bulging. Tympanic membrane is not injected, perforated, erythematous or retracted.      Left Ear: Ear canal and external ear normal. There is no impacted cerumen. Tympanic membrane is bulging. Tympanic membrane is not injected, perforated, erythematous or retracted.      Nose: Congestion and rhinorrhea present. Rhinorrhea is clear.      Right Turbinates: Enlarged and pale.      Left Turbinates: Enlarged and pale.      Mouth/Throat:      Mouth: Mucous membranes are moist.      Pharynx: Uvula midline. Posterior oropharyngeal erythema present. No pharyngeal swelling, oropharyngeal exudate or uvula swelling.      Tonsils: No tonsillar exudate or tonsillar abscesses.   Cardiovascular:      Rate and Rhythm: Normal rate and regular rhythm.      Pulses: Normal pulses.      Heart sounds: Normal heart sounds. Pulmonary:      Effort: Pulmonary effort is normal. No respiratory distress.      Breath sounds: Normal breath sounds. No wheezing.   Lymphadenopathy:      Cervical: No cervical adenopathy.   Neurological:      Mental Status: She is alert.   Psychiatric:         Mood and Affect: Mood normal.         Behavior: Behavior normal.          Assessment and Plan:  1. Urinary tract infection without hematuria,  site unspecified  CULTURE-URINE W/SENSITIVITY    nitrofurantoin monohyd/m-cryst (MACROBID) 100 mg capsule   2. Dysuria  POC URINE DIPSTICK AUTO READ   3. Allergic rhinitis, unspecified seasonality, unspecified trigger     Patient reports with dysuria for 3 days. POC UA completed. Positive for nitrites and trace glucose. Will treat with macrobid for 5 days. Urine culture ordered. Discussed some possible causes of UTIs. Patient education provided. For upper respiratory findings the lung exam was clear and patient speaking in full clear sentences. Discussed with patient. Patient to pick up second nasal spray. If symptoms change from typical allergy symptoms for her should take home covid test. If symptoms worsen or shortness of breath develops should seek further medical care. All questions and concerns addressed.   Encounter Medications   Medications   ? nitrofurantoin monohyd/m-cryst (MACROBID) 100 mg capsule     Sig: Take one capsule by mouth every 12 hours. Take with food.  Indications: genitourinary tract infections     Dispense:  10 capsule     Refill:  0      There are no Patient Instructions on file for this visit.                  I have marked all as reviewed.

## 2021-01-01 ENCOUNTER — Encounter: Admit: 2021-01-01 | Discharge: 2021-01-01 | Payer: No Typology Code available for payment source

## 2021-01-06 ENCOUNTER — Encounter: Admit: 2021-01-06 | Discharge: 2021-01-06 | Payer: No Typology Code available for payment source

## 2021-01-15 ENCOUNTER — Encounter: Admit: 2021-01-15 | Discharge: 2021-01-15 | Payer: No Typology Code available for payment source

## 2021-01-29 ENCOUNTER — Encounter: Admit: 2021-01-29 | Discharge: 2021-01-29 | Payer: No Typology Code available for payment source

## 2021-01-29 ENCOUNTER — Ambulatory Visit: Admit: 2021-01-29 | Discharge: 2021-01-29 | Payer: No Typology Code available for payment source

## 2021-01-29 DIAGNOSIS — D352 Benign neoplasm of pituitary gland: Secondary | ICD-10-CM

## 2021-01-29 DIAGNOSIS — E039 Hypothyroidism, unspecified: Secondary | ICD-10-CM

## 2021-01-29 DIAGNOSIS — N390 Urinary tract infection, site not specified: Secondary | ICD-10-CM

## 2021-01-29 DIAGNOSIS — F4310 Post-traumatic stress disorder, unspecified: Secondary | ICD-10-CM

## 2021-01-29 DIAGNOSIS — F99 Mental disorder, not otherwise specified: Secondary | ICD-10-CM

## 2021-01-29 DIAGNOSIS — E7800 Pure hypercholesterolemia, unspecified: Secondary | ICD-10-CM

## 2021-01-29 DIAGNOSIS — G4730 Sleep apnea, unspecified: Secondary | ICD-10-CM

## 2021-01-29 DIAGNOSIS — Q615 Medullary cystic kidney: Secondary | ICD-10-CM

## 2021-01-29 DIAGNOSIS — R000 Tachycardia, unspecified: Secondary | ICD-10-CM

## 2021-01-29 DIAGNOSIS — Z8659 Personal history of other mental and behavioral disorders: Secondary | ICD-10-CM

## 2021-01-29 DIAGNOSIS — I499 Cardiac arrhythmia, unspecified: Secondary | ICD-10-CM

## 2021-01-29 DIAGNOSIS — R7989 Other specified abnormal findings of blood chemistry: Secondary | ICD-10-CM

## 2021-01-29 DIAGNOSIS — F32A Depressed: Secondary | ICD-10-CM

## 2021-01-29 DIAGNOSIS — E785 Hyperlipidemia, unspecified: Secondary | ICD-10-CM

## 2021-01-29 LAB — TSH WITH FREE T4 REFLEX: TSH: 1.2 uU/mL (ref 0.35–5.00)

## 2021-01-29 NOTE — Progress Notes
Chief Complaint   Patient presents with   ? Hypothyroidism   Pituitary microadenoma    Date of Service: 01/29/2021    Referring physician:  Self, Referral  No address on file    Holly Maldonado is a 60 y.o. female. DOB: 08/10/1960   MRN#: [id number]      HPI:  Subjective:    History of Present Illness  Holly Maldonado is a 60 y.o. female   ?  Hypothyroidism: She is on levothyroxine 100 mcg daily.  Dose was reduced earlier 2022.  She has depression.  She is compliant with medicine and takes it on an empty stomach in morning.  ?  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 He has been followed for the prolactin. She appears to have had regression of pituitary adenoma in imagine 11/2017.  No adenoma was seen in 2020 on MRI.  Last prolactin level was normal.  She denies breast complaints, discharge.    Patient has a history of depression and has been  Lithium, Prozac, Lamictal, Cymbalta in the past.  ?  She has gained weight.    Review of Systems   Constitutional: Positive for malaise/fatigue. Negative for fever.   Cardiovascular: Negative for chest pain.   Psychiatric/Behavioral: Positive for depression.       Medical History:   Diagnosis Date   ? Arrhythmia     multiple PVCs from EKG 08/14/12   ? depression    ? Dyslipidemia    ? Endometriosis    ? H/O eating disorder    ? Hematuria     microscopic   ? High cholesterol    ? Hypothyroid    ? Medullary sponge kidney    ? Mental disorder    ? Nephrocalcinosis    ? PTSD (post-traumatic stress disorder)    ? Recurrent UTI    ? Sleep apnea    ? Tachycardia      Surgical History:   Procedure Laterality Date   ? HX TUBAL LIGATION  2010   ? COLONOSCOPY DIAGNOSTIC WITH SPECIMEN COLLECTION BY BRUSHING/ WASHING - FLEXIBLE N/A 12/15/2018    Performed by Veneta Penton, MD at [facility] OR   ? ELECTROCARDIOGRAM     ? HX BLADDER SUSPENSION     ? HX CESAREAN SECTION     ? HX HYSTERECTOMY     ? HX SINUS SURGERY     ? HX TONSILLECTOMY     ? LAPAROSCOPY       Family History   Problem Relation Age of Onset   ? Coronary Artery Disease Father    ? Diabetes Type II Mother    ? Cancer Mother         leukemia   ? Diabetes Mother    ? Heart Attack Mother    ? Coronary Artery Disease Maternal Grandmother    ? Circulatory problem Maternal Grandmother    ? Coronary Artery Disease Maternal Grandfather    ? Heart Attack Maternal Grandfather    ? Heart Surgery Maternal Grandfather    ? Coronary Artery Disease Paternal Grandmother    ? Coronary Artery Disease Paternal Grandfather    ? None Reported Brother    ? None Reported Son    ? None Reported Daughter    ? None Reported Daughter    ?  None Reported Daughter    ? None Reported Son    ? Glaucoma Neg Hx    ? Macular Degen Neg Hx    ? Strabismus Neg Hx    ? Retinal Detachment Neg Hx    ? Blindness Neg Hx    ? Cataract Neg Hx    ? Amblyopia Neg Hx      Social History     Socioeconomic History   ? Marital status: Divorced   ? Number of children: 5   Tobacco Use   ? Smoking status: Former Smoker     Packs/day: 1.00     Types: Cigarettes     Quit date: 08/22/1992     Years since quitting: 28.4   ? Smokeless tobacco: Never Used   Substance and Sexual Activity   ? Alcohol use: No   ? Drug use: No   ? Sexual activity: Not Currently         Objective:     ? aspirin EC 81 mg tablet Take 81 mg by mouth daily. Every other day.   ? AUVI-Q 0.3 mg/0.3 mL auto-injector Inject 0.3 mg (1 Pen) into thigh if needed for anaphylactic reaction. May repeat in 5-15 minutes if needed.   ? Cholecalciferol (Vitamin D3) 1,000 unit cap Take 5,000 Units by mouth.   ? clonazePAM (KLONOPIN) 2 mg tablet Take one tablet by mouth at bedtime as needed for up to 90 days.   ? docosahexaenoic acid/epa (FISH OIL PO) Take 3 capsules by mouth daily.   ? EPINEPHrine (EPIPEN 2-PAK) 1 mg/mL injection pen (2-Pack) Inject 0.3 mg (1 Pen) into thigh if needed for anaphylactic reaction. May repeat in 5-15 minutes if needed.   ? esketamine (SPRAVATO) 56 mg (28 mg x 2) nasal spray Insert four sprays into nose as directed twice weekly. Wait 5 minutes between use of each device.  Indications: additional treatment for major depressive disorder   ? esketamine (SPRAVATO) 84 mg (28 mg x 3) nasal spray Insert six sprays into nose as directed twice weekly. Wait 5 minutes between use of each device.  Indications: additional treatment for major depressive disorder   ? estradioL (CLIMARA) 0.075 mg/24 hr patch Apply one patch to top of skin as directed twice weekly.   ? ezetimibe (ZETIA) 10 mg tablet Take one tablet by mouth daily.   ? flecainide (TAMBOCOR) 100 mg tablet Take one tablet by mouth twice daily. (Patient taking differently: Take 50 mg by mouth twice daily.)   ? flunisolide 25 mcg (0.025 %) nasal spray Apply one spray to each nostril as directed twice daily.   ? FLUoxetine (PROZAC) 20 mg capsule Take three capsules by mouth daily for 90 days. Indications: bulimia, major depressive disorder, posttraumatic stress syndrome   ? ketotifen (ZADITOR) 0.025 % (0.035 %) ophthalmic solution Place one drop into or around eye(s) twice daily as needed.   ? lamoTRIgine (LAMICTAL) 100 mg tablet Take one tablet by mouth at bedtime daily for 90 days. Take with lamictal 25mg  nightly for TOTAL DAILY DOSE of 125mg  nightly.   ? lamoTRIgine (LAMICTAL) 25 mg tablet Take one tablet by mouth at bedtime daily for 90 days. Take with lamictal 100mg  nightly for TOTAL DAILY DOSE of 125mg  nightly.   ? levothyroxine (SYNTHROID) 100 mcg tablet Take one tablet by mouth daily 30 minutes before breakfast.   ? magnesium oxide (MAG-OX) 400 mg (241.3 mg magnesium) tablet Take 400 mg by mouth daily.   ? nitrofurantoin monohyd/m-cryst (MACROBID) 100 mg  capsule Take one capsule by mouth every 12 hours. Take with food.  Indications: genitourinary tract infections   ? olopatadine (PATANASE) 0.6 % nasal spray Apply one spray to two sprays to each nostril as directed twice daily as needed.   ? rOPINIRole (REQUIP) 0.5 mg tablet Take 0.5 mg by mouth at bedtime daily.   ? rosuvastatin (CRESTOR) 10 mg tablet Take one tablet by mouth daily.   ? zinc sulfate 220 mg (50 mg elemental zinc) capsule Take 220 mg by mouth daily.     Vitals:    01/29/21 1126   BP: 128/72   BP Source: Arm, Left Upper   Pulse: 74   Temp: 36.4 ?C (97.5 ?F)   Resp: 16   SpO2: 95%   PainSc: Zero   Weight: 91.3 kg (201 lb 4.8 oz)   Height: 162.6 cm (5' 4)     Body mass index is 34.55 kg/m?Marland Kitchen     Physical Exam  Vitals and nursing note reviewed.   Constitutional:       General: She is not in acute distress.     Appearance: Normal appearance. She is not ill-appearing, toxic-appearing or diaphoretic.   HENT:      Head: Normocephalic and atraumatic.      Nose: Nose normal.   Eyes:      Conjunctiva/sclera: Conjunctivae normal.   Neurological:      Mental Status: She is alert and oriented to person, place, and time.   Psychiatric:         Mood and Affect: Mood normal.               Lab   Thyroid Studies    Lab Results   Component Value Date/Time    TSH 0.04 (L) 08/07/2020 09:10 AM    No results found for: Elton Sin     11/2018 MRI  There is homogeneous enhancement of the pituitary gland, which   demonstrates a somewhat flattened configuration along the sellar floor   without sellar expansion. The posterior pituitary bright spot is normal in   appearance and location. No discrete sellar or suprasellar mass is   identified. The infundibulum, ?optic chiasm, and cavernous sinuses   demonstrate normal signal and morphology. ?   The ventricles and subarachnoid spaces are normal in size and   configuration. There are a few scattered foci of supratentorial white   matter FLAIR hyperintensity, in a bifrontal and subcortical predominant   distribution. There is no midline shift or mass effect. There is no area   of abnormal contrast enhancement. The vascular flow-voids are   unremarkable. Diffusion weighted imaging is not indicative of acute or   recent infarct.       Outside labs:  02/13/2017  TSH 0.37 (reference 0.40-4.5), free T4 1.1 (reference 0.8- 1.6)  Normal CMP, normal CBC  04/20/2018  Cholesterol 276, LDL 195, HDL 79  TSH 12.12    ?  05/23/2015 MRI brain there is relatively unchanged appearance of 6 mm area of non-enhancement noted within posterior aspect of the right lateral aspect of the pituitary gland concerning for underlying pituitary microadenoma  06/22/2017 bone alkaline phosphatase normal  10/10/2017 ACTH at 9:48 AM 16.66 (reference 7.2-63.3)  10/17/2017 prolactin 42.79 (reference 4.79-23.3) normal at 99.17, FSH and LH both normal 72.36 and 31.69 respectively  11/2017 MRI brain 0.5 x 0.4 x 0.7 cm focal area of nodular enhancement along the left temporal convexity extra-axial space 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  Minimal partial empty  sella without convincing evidence of pituitary microadenoma    Assessment and Plan:    [name] A. Brieske was seen today for hypothyroidism.    Diagnoses and all orders for this visit:    Hypothyroidism (acquired)  -     TSH WITH FREE T4 REFLEX; Future; Expected date: 01/29/2021  -     TSH WITH FREE T4 REFLEX; Future; Expected date: 07/08/2021    Elevated prolactin level    Pituitary adenoma (HCC)        Assessment  ?  Hx Pititary microadenoma / Partial Empty Sella  05/23/2015 MRI brain there is relatively unchanged appearance of 6 mm area of non-enhancement noted within posterior aspect of the right lateral aspect of the pituitary gland concerning for underlying pituitary microadenoma  10/10/2017 ACTH at 9:48 AM 16.66 (reference 7.2-63.3)  10/17/2017 prolactin 42.79 (reference 4.79-23.3) normal at 99.17, FSH and LH both normal 72.36 and 31.69 respectively  11/2017 MRI brain 0.5 x 0.4 x 0.7 cm focal area of nodular enhancement along the left temporal convexity extra-axial space 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  Minimal partial empty sella without convincing evidence of pituitary microadenoma  2020: No microadenoma was seen.    Prolactinoma  Previous imaging demonstrates microadenoma  10/17/2017 prolactin 42.79 (reference 4.79-23.3)  Repeat prolactin level now.  If prolactin is elevated or higher than the last one then we will consider doing another MRI.  For now hold off on MRI.      Uncontrolled hypothyroidism  Dx: 1997  On replacement.  Check levels now.  Continue levothyroxine 100 mcg daily for now.  ?      Electronically signed by Lucrezia Starch, MD 01/29/2021

## 2021-02-04 ENCOUNTER — Encounter: Admit: 2021-02-04 | Discharge: 2021-02-04 | Payer: No Typology Code available for payment source

## 2021-02-07 ENCOUNTER — Encounter: Admit: 2021-02-07 | Discharge: 2021-02-07 | Payer: No Typology Code available for payment source

## 2021-02-10 ENCOUNTER — Encounter: Admit: 2021-02-10 | Discharge: 2021-02-10 | Payer: No Typology Code available for payment source

## 2021-02-11 ENCOUNTER — Encounter: Admit: 2021-02-11 | Discharge: 2021-02-11 | Payer: No Typology Code available for payment source

## 2021-02-11 ENCOUNTER — Ambulatory Visit: Admit: 2021-02-11 | Discharge: 2021-02-11 | Payer: No Typology Code available for payment source

## 2021-02-11 DIAGNOSIS — Q615 Medullary cystic kidney: Secondary | ICD-10-CM

## 2021-02-11 DIAGNOSIS — F4310 Post-traumatic stress disorder, unspecified: Secondary | ICD-10-CM

## 2021-02-11 DIAGNOSIS — N390 Urinary tract infection, site not specified: Secondary | ICD-10-CM

## 2021-02-11 DIAGNOSIS — R000 Tachycardia, unspecified: Secondary | ICD-10-CM

## 2021-02-11 DIAGNOSIS — E039 Hypothyroidism, unspecified: Secondary | ICD-10-CM

## 2021-02-11 DIAGNOSIS — N200 Calculus of kidney: Principal | ICD-10-CM

## 2021-02-11 DIAGNOSIS — Z8659 Personal history of other mental and behavioral disorders: Secondary | ICD-10-CM

## 2021-02-11 DIAGNOSIS — F99 Mental disorder, not otherwise specified: Secondary | ICD-10-CM

## 2021-02-11 DIAGNOSIS — F32A Depressed: Secondary | ICD-10-CM

## 2021-02-11 DIAGNOSIS — I499 Cardiac arrhythmia, unspecified: Secondary | ICD-10-CM

## 2021-02-11 DIAGNOSIS — E785 Hyperlipidemia, unspecified: Secondary | ICD-10-CM

## 2021-02-11 DIAGNOSIS — E7800 Pure hypercholesterolemia, unspecified: Secondary | ICD-10-CM

## 2021-02-11 DIAGNOSIS — G4730 Sleep apnea, unspecified: Secondary | ICD-10-CM

## 2021-02-11 LAB — CBC
HEMATOCRIT: 37 % (ref 36–45)
HEMOGLOBIN: 12 g/dL — ABNORMAL HIGH (ref 12.0–15.0)
MCH: 30 pg (ref 26–34)
MCHC: 33 g/dL — ABNORMAL LOW (ref 32.0–36.0)
MCV: 90 FL — ABNORMAL HIGH (ref 80–100)
MPV: 8 FL (ref 7–11)
PLATELET COUNT: 259 K/UL (ref 150–400)
RBC COUNT: 4.1 M/UL (ref 4.0–5.0)
RDW: 15 % — ABNORMAL HIGH (ref >60)
WBC COUNT: 4.8 K/UL (ref 4.5–11.0)

## 2021-02-11 LAB — BASIC METABOLIC PANEL
POTASSIUM: 5.1 MMOL/L — ABNORMAL HIGH (ref 3.5–5.1)
SODIUM: 136 MMOL/L — ABNORMAL LOW (ref 137–147)

## 2021-02-19 ENCOUNTER — Encounter: Admit: 2021-02-19 | Discharge: 2021-02-19 | Payer: No Typology Code available for payment source

## 2021-02-24 ENCOUNTER — Encounter: Admit: 2021-02-24 | Discharge: 2021-02-24 | Payer: No Typology Code available for payment source

## 2021-02-24 ENCOUNTER — Ambulatory Visit: Admit: 2021-02-24 | Discharge: 2021-02-24 | Payer: No Typology Code available for payment source

## 2021-02-24 DIAGNOSIS — N200 Calculus of kidney: Secondary | ICD-10-CM

## 2021-02-25 ENCOUNTER — Ambulatory Visit: Admit: 2021-02-25 | Discharge: 2021-02-25 | Payer: No Typology Code available for payment source

## 2021-02-25 ENCOUNTER — Encounter: Admit: 2021-02-25 | Discharge: 2021-02-25 | Payer: No Typology Code available for payment source

## 2021-02-25 DIAGNOSIS — F502 Bulimia nervosa: Secondary | ICD-10-CM

## 2021-02-25 DIAGNOSIS — Q615 Medullary cystic kidney: Secondary | ICD-10-CM

## 2021-02-25 DIAGNOSIS — E7800 Pure hypercholesterolemia, unspecified: Secondary | ICD-10-CM

## 2021-02-25 DIAGNOSIS — F4310 Post-traumatic stress disorder, unspecified: Secondary | ICD-10-CM

## 2021-02-25 DIAGNOSIS — G4730 Sleep apnea, unspecified: Secondary | ICD-10-CM

## 2021-02-25 DIAGNOSIS — E039 Hypothyroidism, unspecified: Secondary | ICD-10-CM

## 2021-02-25 DIAGNOSIS — F332 Major depressive disorder, recurrent severe without psychotic features: Secondary | ICD-10-CM

## 2021-02-25 DIAGNOSIS — E278 Other specified disorders of adrenal gland: Secondary | ICD-10-CM

## 2021-02-25 DIAGNOSIS — N390 Urinary tract infection, site not specified: Secondary | ICD-10-CM

## 2021-02-25 DIAGNOSIS — E785 Hyperlipidemia, unspecified: Secondary | ICD-10-CM

## 2021-02-25 DIAGNOSIS — Z8659 Personal history of other mental and behavioral disorders: Secondary | ICD-10-CM

## 2021-02-25 DIAGNOSIS — F4312 Post-traumatic stress disorder, chronic: Secondary | ICD-10-CM

## 2021-02-25 DIAGNOSIS — I499 Cardiac arrhythmia, unspecified: Secondary | ICD-10-CM

## 2021-02-25 DIAGNOSIS — F32A Depressed: Secondary | ICD-10-CM

## 2021-02-25 DIAGNOSIS — F99 Mental disorder, not otherwise specified: Secondary | ICD-10-CM

## 2021-02-25 DIAGNOSIS — R000 Tachycardia, unspecified: Secondary | ICD-10-CM

## 2021-02-25 MED ORDER — HYDROXYZINE HCL 10 MG PO TAB
10 mg | ORAL_TABLET | Freq: Two times a day (BID) | ORAL | 0 refills | 30.00000 days | Status: AC | PRN
Start: 2021-02-25 — End: ?

## 2021-02-25 MED ORDER — FLUOXETINE 40 MG PO CAP
80 mg | ORAL_CAPSULE | Freq: Every day | ORAL | 0 refills | Status: AC
Start: 2021-02-25 — End: ?

## 2021-02-25 MED ORDER — CYPROHEPTADINE 4 MG PO TAB
4 mg | ORAL_TABLET | Freq: Every evening | ORAL | 0 refills | Status: AC
Start: 2021-02-25 — End: ?

## 2021-02-25 MED ORDER — LAMOTRIGINE 25 MG PO TAB
75 mg | ORAL_TABLET | Freq: Every day | ORAL | 0 refills | Status: AC
Start: 2021-02-25 — End: ?

## 2021-02-25 NOTE — Patient Instructions
Continue lamictal 75mg  daily for mood symtpoms   Re-start cyproheptadine 4mg  nightly for nightmares  Increase prozac to 80mg  daily for mood symptoms   Discontinue klonopin 2mg  nightly for insomnia   Start hydroxyzine 10mg  twice daily as needed for anxiety.   Continue therapy!   Attend PHP!    Return to clinic in 3 months.  Please call the clinic to reschedule or cancel appointments if somethings changes.     Our clinic is dedicated to making sure your prescriptions are filled in a timely manner during regular business hours (8am-5pm).  If you need a medication refill, please call your pharmacy to request a refill.  Please make sure to request refills at least 72 hours in advance of your last dose.  Your pharmacy will reach out to [name] electronically, which is the preferred method.  Please try to refrain from paging the on-call psychiatrist regarding medication refills (including controlled substances), as you may not receive a refill or a full refill at that time.      For nonemergent concerns, you may reach out to the clinic via MyChart. You should receive a response within 72 business hours from our clinic staff/providers.    [facility] provides its patients with 24/7 care. If you are concerned about an impending psychiatric emergency (i.e. if you have any concerns about risk of harm to yourself, risk of harm to others or feel unsure about your ability to care for yourself), you may reach out to the overnight psychiatrist on-call by calling the main [facility] operator line outside of normal business hours ([phone]). If you are unsure if you are experiencing a psychiatric emergency, please ask the operator to page the on-call psychiatrist for clarification.     If you require an immediate response from a health care professional, please do not call the on-call psychiatrist and call 911 directly.     In the event of a safety concern or suicidal thoughts, call 911 or go to the nearest emergency room. National Suicide Prevention Lifeline ([phone] (Talk). Crisis Text Hotline (text 762 652 2837).     It was good seeing you!   Dr. [name], Psychiatry Resident

## 2021-02-25 NOTE — Progress Notes
ATTENDING NOTE      Encounter Date: 02/25/2021    I saw and evaluated Holly Maldonado, discussed with Elvina Mattes, DO and concur with the assessment and treatment plan.     PRESENTING PROBLEM AND BACKGROUND: Patient is 60 y.o. female  first seen in our clinic Oct 2021 with MDD, GAD, PTSD, and bulimia and not very responsive to treatments including Spruvato, and IOP twice for eating and affective disorders. Multiple medication trials with insufficient benefit. She lives with her daughter and grandchild. Last visit cyproheptadine and lamotrigine increased.     CURRENT TREATMENT AND RESPONSES: Patient reports she is worse with increase bulimic episodes, weight, panic attacks and anxiety. She discontinued cyproheptadine and clonazepam on her own and decreased lamotrigine. She is planing to enter IOP for bulimia and is in therapy. She agrees with treatment plan.     PLAN:  1. Discontinue clonazepam (already done by patient)  2. Decrease lamotrigine to 75 mg (already done by patient)   3. Increase fluoxetine from 60 mg qam to 80 mg qam  4. Retrial cyproheptadine 4 mg daily (she decreased from 8 mg)  5. Trial hydroxyzine 10 mg bid prn  6. Continue therapy and enter IOP as planned  7. Monitor bulimic episodes, anxiety, insomnia, weight
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
## 2021-02-25 NOTE — Progress Notes
History      CC: Nephrolithiasis.    HPI: Holly Maldonado is a 60 y.o. female with past medical history of nephrolithiasis and medullary sponge kidney disease.    Nephrolithiasis: First stone episode was 22 years ago. Stone was not analyzed.    Most recent stones were found incidentally on renal ultrasound from 09/01.      Fluid intake:3-4 glasses daily.  Salt: does not add excess salt.  Dairy: does not drink milk, eats cheese.    Reports recent episodes of dysuria , treated for uti but the symptoms recurred.no microbial growth on UA.      Surgeries: none    Denies hx of chronic diarrhea.                    Past Medical History   Medical History:   Diagnosis Date   ? Arrhythmia     multiple PVCs from EKG 08/14/12   ? depression    ? Dyslipidemia    ? Endometriosis    ? H/O eating disorder    ? Hematuria     microscopic   ? High cholesterol    ? Hypothyroid    ? Medullary sponge kidney    ? Mental disorder    ? Nephrocalcinosis    ? PTSD (post-traumatic stress disorder)    ? Recurrent UTI    ? Sleep apnea    ? Tachycardia         Family History  Family History   Problem Relation Age of Onset   ? Coronary Artery Disease Father    ? Diabetes Type II Mother    ? Cancer Mother         leukemia   ? Diabetes Mother    ? Heart Attack Mother    ? Coronary Artery Disease Maternal Grandmother    ? Circulatory problem Maternal Grandmother    ? Coronary Artery Disease Maternal Grandfather    ? Heart Attack Maternal Grandfather    ? Heart Surgery Maternal Grandfather    ? Coronary Artery Disease Paternal Grandmother    ? Coronary Artery Disease Paternal Grandfather    ? None Reported Brother    ? None Reported Son    ? None Reported Daughter    ? None Reported Daughter    ? None Reported Daughter    ? None Reported Son    ? Glaucoma Neg Hx    ? Macular Degen Neg Hx    ? Strabismus Neg Hx    ? Retinal Detachment Neg Hx    ? Blindness Neg Hx    ? Cataract Neg Hx    ? Amblyopia Neg Hx         Social History  Social History Socioeconomic History   ? Marital status: Divorced   ? Number of children: 5   Tobacco Use   ? Smoking status: Former Smoker     Packs/day: 1.00     Types: Cigarettes     Quit date: 08/22/1992     Years since quitting: 28.5   ? Smokeless tobacco: Never Used   Substance and Sexual Activity   ? Alcohol use: No   ? Drug use: No   ? Sexual activity: Not Currently        Medication    HOME MEDS  ? aspirin EC 81 mg tablet Take 81 mg by mouth daily. Every other day.   ? AUVI-Q 0.3 mg/0.3 mL auto-injector Inject 0.3 mg (1  Pen) into thigh if needed for anaphylactic reaction. May repeat in 5-15 minutes if needed.   ? Cholecalciferol (Vitamin D3) 1,000 unit cap Take 5,000 Units by mouth.   ? docosahexaenoic acid/epa (FISH OIL PO) Take 3 capsules by mouth daily.   ? EPINEPHrine (EPIPEN 2-PAK) 1 mg/mL injection pen (2-Pack) Inject 0.3 mg (1 Pen) into thigh if needed for anaphylactic reaction. May repeat in 5-15 minutes if needed.   ? esketamine (SPRAVATO) 56 mg (28 mg x 2) nasal spray Insert four sprays into nose as directed twice weekly. Wait 5 minutes between use of each device.  Indications: additional treatment for major depressive disorder   ? esketamine (SPRAVATO) 84 mg (28 mg x 3) nasal spray Insert six sprays into nose as directed twice weekly. Wait 5 minutes between use of each device.  Indications: additional treatment for major depressive disorder   ? estradioL (CLIMARA) 0.075 mg/24 hr patch Apply one patch to top of skin as directed twice weekly.   ? ezetimibe (ZETIA) 10 mg tablet Take one tablet by mouth daily.   ? flecainide (TAMBOCOR) 100 mg tablet Take one tablet by mouth twice daily. (Patient taking differently: Take 50 mg by mouth twice daily.)   ? flunisolide 25 mcg (0.025 %) nasal spray Apply one spray to each nostril as directed twice daily.   ? FLUoxetine (PROZAC) 20 mg capsule Take three capsules by mouth daily for 90 days. Indications: bulimia, major depressive disorder, posttraumatic stress syndrome ? ketotifen (ZADITOR) 0.025 % (0.035 %) ophthalmic solution Place one drop into or around eye(s) twice daily as needed.   ? lamoTRIgine (LAMICTAL) 25 mg tablet Take 75 mg by mouth daily.   ? levothyroxine (SYNTHROID) 100 mcg tablet Take one tablet by mouth daily 30 minutes before breakfast.   ? magnesium oxide (MAG-OX) 400 mg (241.3 mg magnesium) tablet Take 400 mg by mouth daily.   ? nitrofurantoin monohyd/m-cryst (MACROBID) 100 mg capsule Take one capsule by mouth every 12 hours. Take with food.  Indications: genitourinary tract infections   ? olopatadine (PATANASE) 0.6 % nasal spray Apply one spray to two sprays to each nostril as directed twice daily as needed.   ? rOPINIRole (REQUIP) 0.5 mg tablet Take 0.5 mg by mouth at bedtime daily.   ? rosuvastatin (CRESTOR) 10 mg tablet Take one tablet by mouth daily.   ? zinc sulfate 220 mg (50 mg elemental zinc) capsule Take 220 mg by mouth daily.          Review of Systems  Constitutional: negative  Eyes: negative  Ears, nose, mouth, throat, and face: negative  Respiratory: negative  Cardiovascular: negative  Gastrointestinal: negative  Genitourinary:negative  Integument/breast: negative  Hematologic/lymphatic: negative  Musculoskeletal:negative  Neurological: negative  Endocrine: negative      Physical Exam        Vitals:    02/25/21 0957   BP: 135/86   BP Source: Arm, Right Upper   Pulse: 72   PainSc: Four  Comment: Right side/abdomen   Weight: 94.8 kg (209 lb)     Body mass index is 35.87 kg/m?.    Gen: Alert and Oriented, No Acute Distress   HEENT: Sclera normal; MMM  CV:  S1 and S2 normal, no rubs, murmurs or gallops   Pulm: Clear to Auscultation bilateral   GI: BS+ x4, non-tender to palpation  Neuro: Grossly normal, moving all extremities, speech intact  Ext: no edema, clubbing or cyanosis   Skin: no rash     Assessment  and Plan        Holly Maldonado is a 60 y.o. female        -Nephrolithiasis: Passed  stone more than 20 years ago.  Most recent CT scan shows several 2 to 3 mm right renal calculi.  Patient has low fluid intake based on age and history.  Aim to increase his fluid intake to at least 3 L over 24 hours.  Further patient instructions as outlined below.  Do not have access to the primary  Request for a 24-hour urine collection.      - Medullary sponge kidney disease: We do not have access to the primary radiological diagnosis.  Since she needs a CT scan with contrast for an adrenal incidentaloma, will ask the radiologist to comment on this as well.      -Htn: Blood pressure is elevated in clinic today but patient states that is higher than what is normal for her I discussed with her to  Check her blood pressure at home and send [name] the records.      -Adrenal mass: CT scan shows a 3.8 cm left adrenal mass.  Will need CT scan with contrast to further Characterize mass.  Based on the results she may need referral to see endocrinology.        Doran Durand, MD      Patient Instructions     To help prevent recurrence of kidney stones, please follow the following instructions:    ? High fluid intake (> 3000 mL/ day; 3 liters/ day)  ? At least ten 10 oz glasses/ day  ? Sodium restriction  ? Avoid salty foods & using the salt shaker  ? 2000 - 3000 mg sodium (Na)/ day  ? Moderate Calcium Intake  ? 600 - 1100 calcium (Ca) mg/ day  ? Oxalate Restriction  ? Avoid nuts, spinach, chocolate, tea, potatoes, rhubarb, vitamin C supplements  ? Helpful resource:  www.FunnyWorkshops.no.pdf  ? Avoid excessive animal protein intake  ? 3 - 7 ounces/ day  ? Increase potassium-rich citrus product intake  ? Citric fruits & fruit juices (ex: oranges, lemons, limes, grapefuit)  ? Caution: grapefruit & grapefruit juice may interfere with the metabolism of many prescription medications, so discuss your medication list with you primary care provider prior to starting a grapefruit regimen.)                       Follow-up in 3 months with 24-hour urine collection.

## 2021-02-25 NOTE — Progress Notes
Order for 24hr urine faxed to [facility]. Confirmation rec'd.

## 2021-02-25 NOTE — Patient Instructions
To help prevent recurrence of kidney stones, please follow the following instructions:    High fluid intake (> 3000 mL/ day; 3 liters/ day)  At least ten 10 oz glasses/ day  Sodium restriction  Avoid salty foods & using the salt shaker  2000 - 3000 mg sodium (Na)/ day  Moderate Calcium Intake  600 - 1100 calcium (Ca) mg/ day  Oxalate Restriction  Avoid nuts, spinach, chocolate, tea, potatoes, rhubarb, vitamin C supplements  Helpful resource:  www.ohf.org/docs/Oxalate2008.pdf  Avoid excessive animal protein intake  3 - 7 ounces/ day  Increase potassium-rich citrus product intake  Citric fruits & fruit juices (ex: oranges, lemons, limes, grapefuit)  Caution: grapefruit & grapefruit juice may interfere with the metabolism of many prescription medications, so discuss your medication list with you primary care provider prior to starting a grapefruit regimen.)

## 2021-03-03 ENCOUNTER — Encounter: Admit: 2021-03-03 | Discharge: 2021-03-03 | Payer: No Typology Code available for payment source

## 2021-03-03 ENCOUNTER — Ambulatory Visit: Admit: 2021-03-03 | Discharge: 2021-03-04 | Payer: No Typology Code available for payment source

## 2021-03-03 DIAGNOSIS — E279 Disorder of adrenal gland, unspecified: Secondary | ICD-10-CM

## 2021-03-03 DIAGNOSIS — J309 Allergic rhinitis, unspecified: Secondary | ICD-10-CM

## 2021-03-03 DIAGNOSIS — J069 Acute upper respiratory infection, unspecified: Secondary | ICD-10-CM

## 2021-03-03 DIAGNOSIS — R221 Localized swelling, mass and lump, neck: Secondary | ICD-10-CM

## 2021-03-03 MED ORDER — KETOTIFEN FUMARATE 0.025 % (0.035 %) OP DROP
1 [drp] | Freq: Two times a day (BID) | OPHTHALMIC | 11 refills | Status: AC | PRN
Start: 2021-03-03 — End: ?

## 2021-03-03 MED ORDER — FLUNISOLIDE 25 MCG (0.025 %) NA SPRY
1 | Freq: Two times a day (BID) | NASAL | 11 refills | 25.00000 days | Status: AC
Start: 2021-03-03 — End: ?

## 2021-03-03 NOTE — Progress Notes
Telehealth Visit Note    Date of Service: 03/03/2021    Obtained patient's verbal consent to treat them and their agreement to [facility] financial policy and NPP via this telehealth visit during the [facility] Emergency    Subjective:               It was a pleasure to see Holly Maldonado. As you know, they are a 60 y.o. patient who presents to [facility] for chief concern of:  Chief Complaint   Patient presents with   ? Follow Up     Their primary care provider is [name].        History of Present Illness    She has a cold or URI.  Was having cough.  Today woke up with sore throat.  Hasn't taken Covid19 test.  Hasn't seen PCP or UC for evaluation.      Started flunisolide and finds it helpful.  Takes Zyrtec BID and ketotifen eye drops PRN.  Medications are tolerated without unacceptable side effects and are helpful per patient.  Patient is satisfied with control of symptoms on current management and does not wish to make changes.    She preferred to discontinue allergy shots.    She questions if she could have a food allergy?  For 1 year or so, she has intermittent sensation that her throat is swollen and it's hard to swallow.  No choking, food getting stuck, or needing Heimlich maneuver.  For evaluation of this, she's seen [name] who did laryngoscopy and diagnosed LPRD.  She's not seen GI.  No reproducible food ingredient triggers for symptoms in the throat.  She has no h/o lip/tongue swelling, hives, difficulty breathing, vomiting, passing out to any food at any time.         Review of Systems   HENT: Positive for sore throat.    Respiratory: Positive for cough.    Gastrointestinal:        Difficulty swallowing   Allergic/Immunologic: Positive for environmental allergies.     ROS is otherwise negative.      Objective:         ? aspirin EC 81 mg tablet Take 81 mg by mouth daily. Every other day.   ? AUVI-Q 0.3 mg/0.3 mL auto-injector Inject 0.3 mg (1 Pen) into thigh if needed for anaphylactic reaction. May repeat in 5-15 minutes if needed.   ? Cholecalciferol (Vitamin D3) 1,000 unit cap Take 5,000 Units by mouth.   ? cyproheptadine (PERIACTIN) 4 mg tablet Take one tablet by mouth at bedtime daily.   ? docosahexaenoic acid/epa (FISH OIL PO) Take 3 capsules by mouth daily.   ? esketamine (SPRAVATO) 56 mg (28 mg x 2) nasal spray Insert four sprays into nose as directed twice weekly. Wait 5 minutes between use of each device.  Indications: additional treatment for major depressive disorder   ? esketamine (SPRAVATO) 84 mg (28 mg x 3) nasal spray Insert six sprays into nose as directed twice weekly. Wait 5 minutes between use of each device.  Indications: additional treatment for major depressive disorder   ? estradioL (CLIMARA) 0.075 mg/24 hr patch Apply one patch to top of skin as directed twice weekly.   ? ezetimibe (ZETIA) 10 mg tablet Take one tablet by mouth daily.   ? flecainide (TAMBOCOR) 100 mg tablet Take one tablet by mouth twice daily. (Patient taking differently: Take 50 mg by mouth twice daily.)   ? flunisolide  25 mcg (0.025 %) nasal spray Apply one spray to each nostril as directed twice daily.   ? fluoxetine (PROZAC) 40 mg capsule Take two capsules by mouth daily for 90 days. Indications: bulimia, major depressive disorder, posttraumatic stress syndrome   ? hydrOXYzine (ATARAX) 10 mg tablet Take one tablet by mouth twice daily as needed for Anxiety.   ? ketotifen (ZADITOR) 0.025 % (0.035 %) ophthalmic solution Place one drop into or around eye(s) twice daily as needed.   ? lamoTRIgine (LAMICTAL) 25 mg tablet Take three tablets by mouth daily.   ? levothyroxine (SYNTHROID) 100 mcg tablet Take one tablet by mouth daily 30 minutes before breakfast.   ? magnesium oxide (MAG-OX) 400 mg (241.3 mg magnesium) tablet Take 400 mg by mouth daily.   ? olopatadine (PATANASE) 0.6 % nasal spray Apply one spray to two sprays to each nostril as directed twice daily as needed.   ? rOPINIRole (REQUIP) 0.5 mg tablet Take 0.5 mg by mouth at bedtime daily.   ? rosuvastatin (CRESTOR) 10 mg tablet Take one tablet by mouth daily.   ? zinc sulfate 220 mg (50 mg elemental zinc) capsule Take 220 mg by mouth daily.     There were no vitals filed for this visit.  There is no height or weight on file to calculate BMI.     Physical Exam  Constitutional:       General: She is not in acute distress.  Pulmonary:      Effort: Pulmonary effort is normal. No respiratory distress.   Neurological:      Mental Status: She is alert.   Psychiatric:         Mood and Affect: Mood normal.         Behavior: Behavior normal.              Assessment and Plan:    1. Dysphagia, unspecified type    2. LPRD (laryngopharyngeal reflux disease)    3. Throat swelling    4. Allergic rhinoconjunctivitis    5. Upper respiratory tract infection, unspecified type      For h/o ARC, she may continue flunisolide 1p BID, Zyrtec 10mg  BID, and Zaditor 1 drop each eye BID PRN.  Proper nasal inhaler technique was demonstrated to minimize risk of nosebleeds previously.  Potential adverse effects of medications were discussed and all questions were answered to the best of my ability previously.  If her symptoms are well-controlled and she wants to roll the dice and try to reduce medications, she may do so, and would suggest to reduce Zyrtec to 10mg  daily or daily PRN, and reduce flunisolide to 1p en BID PRN.  If symptoms return or worsen with reduced allergy medication use, then may resume daily scheduled medication use as she is currently doing.  Contact me or follow up to be seen if symptoms worsen, change, an/dor develops adverse effects of medication.  Meds refilled per patient request.  Patient prefers to stay off allergy shots at this time.    Removed EpiPen and macrolide from [facility] per patient-entered request on EMR.    For acute URI, recommend to take an at-home Covid19 test; if negative, recommend to go to PCP and/or UC for evaluation and consideration for flu test and/or rapid Strep test.  Follow up with PCP for URI evaluation and management.    For h/o LPRD and dysphagia and sensation of throat swelling, differentials include but aren't limited to EOE, suboptimally controlled GERD, dysphagia from  an underlying neurologic or autoimmune disorder, and others.  For further evaluation, recommend consultation with Gastroenterology for consideration of EGD with esophageal biopsies and/or swallow study and/or other evaluations per their discretion if they felt such was appropriate.  Follow up with Dr. [name] of [facility] for h/o LPRD.               Return in about 6 months (around 09/01/2021), or if symptoms worsen or fail to improve, for Telehealth, In-Person.    Orders Placed This Encounter   ? GASTROENTEROLOGY   ? flunisolide 25 mcg (0.025 %) nasal spray   ? ketotifen (ZADITOR) 0.025 % (0.035 %) ophthalmic solution       25 minutes total time spent on this patient's encounter on this date including preparing to see the patient, performing a medically appropriate examination and/or evaluation, ordering medications/tests/procedures, documenting clinical information in the electronic health record, with counseling and coordination of care taking >50% of the visit.    Thank you for allowing [name] to participate in the care of this patient.  Please feel free to contact us if there are any questions or concerns about the patient.    Pattricia Boss, MD   Assistant Professor of Allergy & Clinical Immunology  Department of Internal Medicine   [facility]

## 2021-03-04 DIAGNOSIS — K219 Gastro-esophageal reflux disease without esophagitis: Secondary | ICD-10-CM

## 2021-03-04 DIAGNOSIS — R1310 Dysphagia, unspecified: Secondary | ICD-10-CM

## 2021-03-06 ENCOUNTER — Ambulatory Visit: Admit: 2021-03-06 | Discharge: 2021-03-06 | Payer: No Typology Code available for payment source

## 2021-03-06 ENCOUNTER — Encounter: Admit: 2021-03-06 | Discharge: 2021-03-06 | Payer: No Typology Code available for payment source

## 2021-03-06 DIAGNOSIS — E278 Other specified disorders of adrenal gland: Secondary | ICD-10-CM

## 2021-03-06 MED ORDER — IOHEXOL 350 MG IODINE/ML IV SOLN
100 mL | Freq: Once | INTRAVENOUS | 0 refills | Status: CP
Start: 2021-03-06 — End: ?
  Administered 2021-03-06: 19:00:00 100 mL via INTRAVENOUS

## 2021-03-06 MED ORDER — SODIUM CHLORIDE 0.9 % IJ SOLN
50 mL | Freq: Once | INTRAVENOUS | 0 refills | Status: CP
Start: 2021-03-06 — End: ?
  Administered 2021-03-06: 19:00:00 50 mL via INTRAVENOUS

## 2021-03-11 ENCOUNTER — Encounter: Admit: 2021-03-11 | Discharge: 2021-03-11 | Payer: No Typology Code available for payment source

## 2021-03-11 ENCOUNTER — Ambulatory Visit: Admit: 2021-03-11 | Discharge: 2021-03-11 | Payer: No Typology Code available for payment source

## 2021-03-11 DIAGNOSIS — E279 Disorder of adrenal gland, unspecified: Secondary | ICD-10-CM

## 2021-03-11 LAB — ESTRADIOL (E2): ESTRADIOL: <15 pg/mL

## 2021-03-11 LAB — ACTH: ACTH: 9 pg/mL (ref 7–63)

## 2021-03-11 LAB — TESTOSTERONE,TOTAL: TESTOSTERONE, TOTAL: <10 ng/dL (ref 6–86)

## 2021-03-12 ENCOUNTER — Encounter: Admit: 2021-03-12 | Discharge: 2021-03-12 | Payer: No Typology Code available for payment source

## 2021-03-12 ENCOUNTER — Ambulatory Visit: Admit: 2021-03-12 | Discharge: 2021-03-12 | Payer: No Typology Code available for payment source

## 2021-03-12 DIAGNOSIS — F4310 Post-traumatic stress disorder, unspecified: Secondary | ICD-10-CM

## 2021-03-12 DIAGNOSIS — E278 Other specified disorders of adrenal gland: Principal | ICD-10-CM

## 2021-03-12 DIAGNOSIS — E785 Hyperlipidemia, unspecified: Secondary | ICD-10-CM

## 2021-03-12 DIAGNOSIS — E039 Hypothyroidism, unspecified: Secondary | ICD-10-CM

## 2021-03-12 DIAGNOSIS — I499 Cardiac arrhythmia, unspecified: Secondary | ICD-10-CM

## 2021-03-12 DIAGNOSIS — Q615 Medullary cystic kidney: Secondary | ICD-10-CM

## 2021-03-12 DIAGNOSIS — E7800 Pure hypercholesterolemia, unspecified: Secondary | ICD-10-CM

## 2021-03-12 DIAGNOSIS — R000 Tachycardia, unspecified: Secondary | ICD-10-CM

## 2021-03-12 DIAGNOSIS — F32A Depressed: Secondary | ICD-10-CM

## 2021-03-12 DIAGNOSIS — Z8659 Personal history of other mental and behavioral disorders: Secondary | ICD-10-CM

## 2021-03-12 DIAGNOSIS — G4730 Sleep apnea, unspecified: Secondary | ICD-10-CM

## 2021-03-12 DIAGNOSIS — F99 Mental disorder, not otherwise specified: Secondary | ICD-10-CM

## 2021-03-12 DIAGNOSIS — N390 Urinary tract infection, site not specified: Secondary | ICD-10-CM

## 2021-03-17 ENCOUNTER — Encounter: Admit: 2021-03-17 | Discharge: 2021-03-17 | Payer: No Typology Code available for payment source

## 2021-03-17 MED ORDER — ESTRADIOL 0.075 MG/24 HR TD PTSW
MEDICATED_PATCH | 0 refills | 43.00000 days | Status: AC
Start: 2021-03-17 — End: ?

## 2021-03-20 ENCOUNTER — Encounter: Admit: 2021-03-20 | Discharge: 2021-03-20 | Payer: No Typology Code available for payment source

## 2021-03-23 ENCOUNTER — Encounter: Admit: 2021-03-23 | Discharge: 2021-03-23 | Payer: No Typology Code available for payment source

## 2021-03-23 ENCOUNTER — Ambulatory Visit: Admit: 2021-03-23 | Discharge: 2021-03-23 | Payer: No Typology Code available for payment source

## 2021-03-23 DIAGNOSIS — R000 Tachycardia, unspecified: Secondary | ICD-10-CM

## 2021-03-23 DIAGNOSIS — F99 Mental disorder, not otherwise specified: Secondary | ICD-10-CM

## 2021-03-23 DIAGNOSIS — E039 Hypothyroidism, unspecified: Secondary | ICD-10-CM

## 2021-03-23 DIAGNOSIS — G4730 Sleep apnea, unspecified: Secondary | ICD-10-CM

## 2021-03-23 DIAGNOSIS — F32A Depressed: Secondary | ICD-10-CM

## 2021-03-23 DIAGNOSIS — E7800 Pure hypercholesterolemia, unspecified: Secondary | ICD-10-CM

## 2021-03-23 DIAGNOSIS — Q615 Medullary cystic kidney: Secondary | ICD-10-CM

## 2021-03-23 DIAGNOSIS — F4310 Post-traumatic stress disorder, unspecified: Secondary | ICD-10-CM

## 2021-03-23 DIAGNOSIS — E785 Hyperlipidemia, unspecified: Secondary | ICD-10-CM

## 2021-03-23 DIAGNOSIS — N390 Urinary tract infection, site not specified: Secondary | ICD-10-CM

## 2021-03-23 DIAGNOSIS — I499 Cardiac arrhythmia, unspecified: Secondary | ICD-10-CM

## 2021-03-23 DIAGNOSIS — R6889 Other general symptoms and signs: Secondary | ICD-10-CM

## 2021-03-23 DIAGNOSIS — Z8659 Personal history of other mental and behavioral disorders: Secondary | ICD-10-CM

## 2021-03-23 MED ORDER — BENZONATATE 100 MG PO CAP
100 mg | ORAL_CAPSULE | ORAL | 0 refills | 9.00000 days | Status: AC
Start: 2021-03-23 — End: ?

## 2021-03-23 MED ORDER — ALBUTEROL SULFATE 90 MCG/ACTUATION IN HFAA
2 | RESPIRATORY_TRACT | 0 refills | Status: AC | PRN
Start: 2021-03-23 — End: ?

## 2021-03-23 NOTE — Patient Instructions
Viral Syndrome (Adult)  A viral illness may cause many symptoms such as fever. Other symptoms depend on the part of the body that the virus affects. If it settles in your nose, throat, and lungs, it may cause cough, sore throat, congestion, runny nose, headache, earache and other ear symptoms, or shortness of breath. If it settles in your stomach and intestinal tract, it may cause nausea, vomiting, cramping, and diarrhea. Sometimes it causes generalized symptoms like aching all over, feeling tired, loss of energy, or loss of appetite.   A viral illness often lasts anywhere from a few days to a few weeks. But sometimes it lasts longer. In some cases, a more serious infection can look like a viral syndrome in the first few days of the illness. You may need another exam and additional tests to know the difference. Watch for the warning signs listed below for when to get medical advice.   Home care  Follow these guidelines for taking care of yourself at home:  If symptoms are severe, rest at home for the first 2 to 3 days.  Stay away from cigarette smoke - both your smoke and the smoke from others.  You may use over-the-counter acetaminophen or ibuprofen for fever, muscle aching, and headache, unless another medicine was prescribed for this. Antibiotics aren't used to treat viral infections. If you have chronic liver or kidney disease or ever had a stomach ulcer or gastrointestinal bleeding, talk with your healthcare provider before using these medicines. No one who is younger than 18 and ill with a fever should take aspirin. It may cause severe disease or death.  Your appetite may be poor, so a light diet is fine. Prevent dehydration by drinking 8 to 12, 8-ounce glasses of fluids each day. This may include water; orange juice; lemonade; apple, grape, and cranberry juice; clear fruit drinks; electrolyte replacement and sports drinks; and decaffeinated teas and coffee. If you've been diagnosed with a kidney disease,  ask your healthcare provider how much and what types of fluids you should drink to prevent dehydration. If you have kidney disease, drinking too much fluid can cause it build up in your body and be dangerous to your health.  Over-the-counter remedies won't shorten the length of the illness. But they may be helpful for symptoms such as cough, sore throat, nasal and sinus congestion, or diarrhea. Don't use decongestants if you have high blood pressure.  Follow-up care  Follow up with your healthcare provider if you don't get better over the next week.   Call 911  Call 911 if any of these occur:   Convulsion  Feeling weak, dizzy, or like you are going to faint  Chest pain, or more than mild shortness of breath  When to get medical advice  Call your healthcare provider right away if any of these occur:  Cough with lots of colored sputum (mucus) or blood in your sputum  Chest pain, shortness of breath, wheezing, or trouble breathing  Severe headache; face, neck, or ear pain  Severe, constant pain in the lower right side of your belly (abdominal)  Continued vomiting (can't keep liquids down)  Frequent diarrhea (more than 5 times a day), or blood (red or black color) or mucus in diarrhea  Feeling weak, dizzy, or like you are going to faint  Extreme thirst  Fever of 100.4F (38C) or higher, or as directed by your provider  StayWell last reviewed this educational content on 05/10/2020     2000-2022 [employer]. All rights reserved. This information is not intended as a substitute for professional medical care. Always follow your healthcare professional's instructions.

## 2021-03-23 NOTE — Progress Notes
Date of Service: 03/23/2021    Holly Maldonado is a 60 y.o. female.  DOB: 1960-12-02  MRN: [id number]     Subjective:             Pt is here today for flu-like symptoms. She reports that she started feeling ill on 03/21/21. Fever in the night yesterday was 99.9. She took a COVID test at home today and it was negative.     Flu  This is a new problem. The current episode started in the past 7 days (03/21/21). The problem occurs constantly. The problem has been unchanged. Associated symptoms include chills, congestion, coughing, fatigue, a fever, headaches, nausea and a sore throat. Pertinent negatives include no abdominal pain, anorexia, change in bowel habit or vomiting. Nothing aggravates the symptoms. She has tried NSAIDs (alka seltzer plus) for the symptoms. The treatment provided moderate relief.            Review of Systems   Constitutional: Positive for chills, fatigue and fever.   HENT: Positive for congestion and sore throat.    Respiratory: Positive for cough.    Gastrointestinal: Positive for nausea. Negative for abdominal pain, anorexia, change in bowel habit and vomiting.   Neurological: Positive for headaches.         Objective:         ? aspirin EC 81 mg tablet Take 81 mg by mouth daily. Every other day.   ? AUVI-Q 0.3 mg/0.3 mL auto-injector Inject 0.3 mg (1 Pen) into thigh if needed for anaphylactic reaction. May repeat in 5-15 minutes if needed.   ? Cholecalciferol (Vitamin D3) 1,000 unit cap Take 5,000 Units by mouth.   ? cyproheptadine (PERIACTIN) 4 mg tablet Take one tablet by mouth at bedtime daily.   ? docosahexaenoic acid/epa (FISH OIL PO) Take 3 capsules by mouth daily.   ? esketamine (SPRAVATO) 56 mg (28 mg x 2) nasal spray Insert four sprays into nose as directed twice weekly. Wait 5 minutes between use of each device.  Indications: additional treatment for major depressive disorder   ? esketamine (SPRAVATO) 84 mg (28 mg x 3) nasal spray Insert six sprays into nose as directed twice weekly. Wait 5 minutes between use of each device.  Indications: additional treatment for major depressive disorder   ? estradioL (VIVELLE-DOT) 0.075 mg/24 hr patch APPLY 1 PATCH TOPICALLY TWICE A WEEK   ? ezetimibe (ZETIA) 10 mg tablet Take one tablet by mouth daily.   ? flecainide (TAMBOCOR) 100 mg tablet Take one tablet by mouth twice daily. (Patient taking differently: Take 50 mg by mouth twice daily.)   ? flunisolide 25 mcg (0.025 %) nasal spray Apply one spray to each nostril as directed twice daily.   ? fluoxetine (PROZAC) 40 mg capsule Take two capsules by mouth daily for 90 days. Indications: bulimia, major depressive disorder, posttraumatic stress syndrome   ? hydrOXYzine (ATARAX) 10 mg tablet Take one tablet by mouth twice daily as needed for Anxiety.   ? ketotifen (ZADITOR) 0.025 % (0.035 %) ophthalmic solution Place one drop into or around eye(s) twice daily as needed.   ? lamoTRIgine (LAMICTAL) 25 mg tablet Take three tablets by mouth daily.   ? levothyroxine (SYNTHROID) 100 mcg tablet Take one tablet by mouth daily 30 minutes before breakfast.   ? magnesium oxide (MAG-OX) 400 mg (241.3 mg magnesium) tablet Take 400 mg by mouth daily.   ? olopatadine (PATANASE) 0.6 % nasal spray Apply one spray to two sprays to  each nostril as directed twice daily as needed.   ? rOPINIRole (REQUIP) 0.5 mg tablet Take 0.5 mg by mouth at bedtime daily.   ? rosuvastatin (CRESTOR) 10 mg tablet Take one tablet by mouth daily.   ? zinc sulfate 220 mg (50 mg elemental zinc) capsule Take 220 mg by mouth daily.     Vitals:    03/23/21 1139   BP: 130/80   Pulse: 86   Temp: 36.8 ?C (98.3 ?F)   Resp: 18   SpO2: 96%   TempSrc: Oral   Weight: 93.4 kg (206 lb)   Height: 162.6 cm (5' 4)     Body mass index is 35.36 kg/m?Marland Kitchen     Physical Exam  Vitals and nursing note reviewed.   Constitutional:       Appearance: She is well-developed.   HENT:      Head: Normocephalic and atraumatic.      Right Ear: Ear canal normal. A middle ear effusion is present.      Left Ear: Tympanic membrane and ear canal normal.      Nose: Congestion present.      Right Sinus: No maxillary sinus tenderness or frontal sinus tenderness.      Left Sinus: No maxillary sinus tenderness or frontal sinus tenderness.      Mouth/Throat:      Mouth: Mucous membranes are moist.      Pharynx: Uvula midline. Posterior oropharyngeal erythema present. No pharyngeal swelling, oropharyngeal exudate or uvula swelling.      Tonsils: No tonsillar exudate.   Eyes:      Conjunctiva/sclera: Conjunctivae normal.      Pupils: Pupils are equal, round, and reactive to light.   Cardiovascular:      Rate and Rhythm: Normal rate and regular rhythm.      Heart sounds: Normal heart sounds.   Pulmonary:      Effort: Pulmonary effort is normal. No respiratory distress.      Breath sounds: Normal breath sounds.   Neurological:      Mental Status: She is alert and oriented to person, place, and time.   Psychiatric:         Mood and Affect: Mood normal.         Behavior: Behavior normal.         Thought Content: Thought content normal.         Judgment: Judgment normal.       Results for orders placed or performed in visit on 03/23/21 (from the past 336 hour(s))   POC RAPID INFLUENZA ASSAY W/OPTIC   Result Value Ref Range    Influenza A Negative Negative, Invalid    Influenza B Negative Negative, Invalid    POC Flu Quality Control Pass         Assessment and Plan:  Holly Maldonado was seen today for generalized body aches, chest burn and cough.    Diagnoses and all orders for this visit:    Flu-like symptoms  -     POC RAPID INFLUENZA ASSAY W/OPTIC    Other orders  -     albuterol sulfate (PROAIR HFA) 90 mcg/actuation HFA aerosol inhaler; Inhale two puffs by mouth into the lungs every 6 hours as needed. Shake well before use.  -     benzonatate (TESSALON PERLES) 100 mg capsule; Take one capsule by mouth every 8 hours.      Patient Instructions     Viral Syndrome (Adult)  A viral illness may  cause many symptoms such as fever. Other symptoms depend on the part of the body that the virus affects. If it settles in your nose, throat, and lungs, it may cause cough, sore throat, congestion, runny nose, headache, earache and other ear symptoms, or shortness of breath. If it settles in your stomach and intestinal tract, it may cause nausea, vomiting, cramping, and diarrhea. Sometimes it causes generalized symptoms like aching all over, feeling tired, loss of energy, or loss of appetite.   A viral illness often lasts anywhere from a few days to a few weeks. But sometimes it lasts longer. In some cases, a more serious infection can look like a viral syndrome in the first few days of the illness. You may need another exam and additional tests to know the difference. Watch for the warning signs listed below for when to get medical advice.   Home care  Follow these guidelines for taking care of yourself at home:  ? If symptoms are severe, rest at home for the first 2 to 3 days.  ? Stay away from cigarette smoke - both your smoke and the smoke from others.  ? You may use over-the-counter acetaminophen or ibuprofen for fever, muscle aching, and headache, unless another medicine was prescribed for this. Antibiotics aren't used to treat viral infections. If you have chronic liver or kidney disease or ever had a stomach ulcer or gastrointestinal bleeding, talk with your healthcare provider before using these medicines. No one who is younger than 46 and ill with a fever should take aspirin. It may cause severe disease or death.  ? Your appetite may be poor, so a light diet is fine. Prevent dehydration by drinking 8 to 12, 8-ounce glasses of fluids each day. This may include water; orange juice; lemonade; apple, grape, and cranberry juice; clear fruit drinks; electrolyte replacement and sports drinks; and decaffeinated teas and coffee. If you've been diagnosed with a kidney disease, ask your healthcare provider how much and what types of fluids you should drink to prevent dehydration. If you have kidney disease, drinking too much fluid can cause it build up in your body and be dangerous to your health.  ? Over-the-counter remedies won't shorten the length of the illness. But they may be helpful for symptoms such as cough, sore throat, nasal and sinus congestion, or diarrhea. Don't use decongestants if you have high blood pressure.  Follow-up care  Follow up with your healthcare provider if you don't get better over the next week.   Call 911  Call 911 if any of these occur:   ? Convulsion  ? Feeling weak, dizzy, or like you are going to faint  ? Chest pain, or more than mild shortness of breath  When to get medical advice  Call your healthcare provider right away if any of these occur:  ? Cough with lots of colored sputum (mucus) or blood in your sputum  ? Chest pain, shortness of breath, wheezing, or trouble breathing  ? Severe headache; face, neck, or ear pain  ? Severe, constant pain in the lower right side of your belly (abdominal)  ? Continued vomiting (can?t keep liquids down)  ? Frequent diarrhea (more than 5 times a day), or blood (red or black color) or mucus in diarrhea  ? Feeling weak, dizzy, or like you are going to faint  ? Extreme thirst  ? Fever of 100.4?F (38?C) or higher, or as directed by your provider  [employer]Well last reviewed this educational content on  05/10/2020  ? 2000-2022 [employer] [place]. All rights reserved. This information is not intended as a substitute for professional medical care. Always follow your healthcare professional's instructions.

## 2021-03-24 ENCOUNTER — Encounter: Admit: 2021-03-24 | Discharge: 2021-03-24 | Payer: No Typology Code available for payment source

## 2021-03-24 NOTE — Telephone Encounter
Called pt and LVM inquiring where she had her sleep study completed.  Asked pt if she had a new one and if she has not had one in the last several years, we can order an updated on when she comes in.Emmaline Kluver, RN

## 2021-03-25 ENCOUNTER — Encounter: Admit: 2021-03-25 | Discharge: 2021-03-25 | Payer: No Typology Code available for payment source

## 2021-03-25 ENCOUNTER — Ambulatory Visit: Admit: 2021-03-25 | Discharge: 2021-03-25 | Payer: No Typology Code available for payment source

## 2021-03-25 DIAGNOSIS — I499 Cardiac arrhythmia, unspecified: Secondary | ICD-10-CM

## 2021-03-25 DIAGNOSIS — F99 Mental disorder, not otherwise specified: Secondary | ICD-10-CM

## 2021-03-25 DIAGNOSIS — E7800 Pure hypercholesterolemia, unspecified: Secondary | ICD-10-CM

## 2021-03-25 DIAGNOSIS — R070 Pain in throat: Secondary | ICD-10-CM

## 2021-03-25 DIAGNOSIS — R000 Tachycardia, unspecified: Secondary | ICD-10-CM

## 2021-03-25 DIAGNOSIS — E039 Hypothyroidism, unspecified: Secondary | ICD-10-CM

## 2021-03-25 DIAGNOSIS — N390 Urinary tract infection, site not specified: Secondary | ICD-10-CM

## 2021-03-25 DIAGNOSIS — G4730 Sleep apnea, unspecified: Secondary | ICD-10-CM

## 2021-03-25 DIAGNOSIS — Q615 Medullary cystic kidney: Secondary | ICD-10-CM

## 2021-03-25 DIAGNOSIS — F32A Depressed: Secondary | ICD-10-CM

## 2021-03-25 DIAGNOSIS — F4310 Post-traumatic stress disorder, unspecified: Secondary | ICD-10-CM

## 2021-03-25 DIAGNOSIS — E785 Hyperlipidemia, unspecified: Secondary | ICD-10-CM

## 2021-03-25 DIAGNOSIS — K21 Gastroesophageal reflux disease with esophagitis without hemorrhage: Secondary | ICD-10-CM

## 2021-03-25 DIAGNOSIS — Z8659 Personal history of other mental and behavioral disorders: Secondary | ICD-10-CM

## 2021-03-25 DIAGNOSIS — R053 Chronic cough: Secondary | ICD-10-CM

## 2021-03-25 MED ORDER — OMEPRAZOLE 40 MG PO CPDR
40 mg | ORAL_CAPSULE | Freq: Every day | ORAL | 1 refills | Status: AC
Start: 2021-03-25 — End: ?

## 2021-03-25 NOTE — Progress Notes
Date of Service: 03/25/2021    Holly Maldonado is a 60 y.o. female.  DOB: 06/25/1960  MRN: [id number]     Subjective:             History of Present Illness  Chief Complaint   Patient presents with   ? Chest Pain     chest hurts hard to breathe hurts to swallow from throat to stomach   Patient reports to clinic with extended history of a burning sensation in her chest.  It hurts to swallow from her throat down to her stomach.  She also has a dry chronic cough and reflux symptoms.  Patient has been treated in the past for this but she is trying to control it with diet.  Her allergist has tried to get her set up with GI for an evaluation but has not been successful at this point.       Review of Systems   Constitutional: Negative.    HENT: Negative.    Respiratory:        Chronic dry cough.   Cardiovascular: Negative.    Gastrointestinal:        Pain from the pharynx down to the stomach with swallowing.  Heartburn/reflux symptoms.   Genitourinary: Negative.    Musculoskeletal: Negative.    Skin: Negative.          Objective:         ? albuterol sulfate (PROAIR HFA) 90 mcg/actuation HFA aerosol inhaler Inhale two puffs by mouth into the lungs every 6 hours as needed. Shake well before use.   ? aspirin EC 81 mg tablet Take 81 mg by mouth daily. Every other day.   ? AUVI-Q 0.3 mg/0.3 mL auto-injector Inject 0.3 mg (1 Pen) into thigh if needed for anaphylactic reaction. May repeat in 5-15 minutes if needed.   ? benzonatate (TESSALON PERLES) 100 mg capsule Take one capsule by mouth every 8 hours.   ? Cholecalciferol (Vitamin D3) 1,000 unit cap Take 5,000 Units by mouth.   ? cyproheptadine (PERIACTIN) 4 mg tablet Take one tablet by mouth at bedtime daily.   ? docosahexaenoic acid/epa (FISH OIL PO) Take 3 capsules by mouth daily.   ? esketamine (SPRAVATO) 56 mg (28 mg x 2) nasal spray Insert four sprays into nose as directed twice weekly. Wait 5 minutes between use of each device.  Indications: additional treatment for major depressive disorder   ? esketamine (SPRAVATO) 84 mg (28 mg x 3) nasal spray Insert six sprays into nose as directed twice weekly. Wait 5 minutes between use of each device.  Indications: additional treatment for major depressive disorder   ? estradioL (VIVELLE-DOT) 0.075 mg/24 hr patch APPLY 1 PATCH TOPICALLY TWICE A WEEK   ? ezetimibe (ZETIA) 10 mg tablet Take one tablet by mouth daily.   ? flecainide (TAMBOCOR) 100 mg tablet Take one tablet by mouth twice daily. (Patient taking differently: Take 50 mg by mouth twice daily.)   ? flunisolide 25 mcg (0.025 %) nasal spray Apply one spray to each nostril as directed twice daily.   ? fluoxetine (PROZAC) 40 mg capsule Take two capsules by mouth daily for 90 days. Indications: bulimia, major depressive disorder, posttraumatic stress syndrome   ? hydrOXYzine (ATARAX) 10 mg tablet Take one tablet by mouth twice daily as needed for Anxiety.   ? ketotifen (ZADITOR) 0.025 % (0.035 %) ophthalmic solution Place one drop into or around eye(s) twice daily as needed.   ? lamoTRIgine (LAMICTAL) 25  mg tablet Take three tablets by mouth daily.   ? levothyroxine (SYNTHROID) 100 mcg tablet Take one tablet by mouth daily 30 minutes before breakfast.   ? magnesium oxide (MAG-OX) 400 mg (241.3 mg magnesium) tablet Take 400 mg by mouth daily.   ? olopatadine (PATANASE) 0.6 % nasal spray Apply one spray to two sprays to each nostril as directed twice daily as needed.   ? omeprazole DR (PRILOSEC) 40 mg capsule Take one capsule by mouth daily before breakfast.   ? rOPINIRole (REQUIP) 0.5 mg tablet Take 0.5 mg by mouth at bedtime daily.   ? rosuvastatin (CRESTOR) 10 mg tablet Take one tablet by mouth daily.   ? zinc sulfate 220 mg (50 mg elemental zinc) capsule Take 220 mg by mouth daily.     Vitals:    03/25/21 1011   BP: 128/78   BP Source: Arm, Left Upper   Pulse: 74   Temp: 36.7 ?C (98 ?F)   Resp: 16   SpO2: 96%   PainSc: Eight   Weight: 95.1 kg (209 lb 9.6 oz)     Body mass index is 35.98 kg/m?Marland Kitchen     Physical Exam  Vitals reviewed.   Constitutional:       Appearance: Normal appearance.   HENT:      Head: Normocephalic and atraumatic.      Right Ear: Tympanic membrane, ear canal and external ear normal.      Left Ear: Tympanic membrane, ear canal and external ear normal.      Mouth/Throat:      Mouth: Mucous membranes are moist.      Pharynx: Oropharynx is clear.   Eyes:      Conjunctiva/sclera: Conjunctivae normal.   Cardiovascular:      Rate and Rhythm: Normal rate and regular rhythm.      Pulses: Normal pulses.      Heart sounds: Normal heart sounds.   Pulmonary:      Effort: Pulmonary effort is normal.      Breath sounds: Normal breath sounds.   Abdominal:      General: Abdomen is flat.      Palpations: Abdomen is soft.   Musculoskeletal:      Cervical back: Normal range of motion and neck supple.   Skin:     General: Skin is warm and dry.   Neurological:      Mental Status: She is alert.   Psychiatric:         Mood and Affect: Mood normal.     Strep screen: Negative         Assessment and Plan:  [name] was seen today for chest pain.    Diagnoses and all orders for this visit:    Gastroesophageal reflux disease with esophagitis without hemorrhage    Other orders  -     omeprazole DR (PRILOSEC) 40 mg capsule; Take one capsule by mouth daily before breakfast.    Patient is also to avoid acid promoting foods such as coffee and alcoholic beverages.  Patient has a follow-up visit with her PCP at this end of this month.  She is to keep this appointment.  Patient needs an evaluation by GI and almost certainly needs an EGD for evaluation of this condition.

## 2021-03-26 ENCOUNTER — Encounter: Admit: 2021-03-26 | Discharge: 2021-03-26 | Payer: No Typology Code available for payment source

## 2021-03-26 ENCOUNTER — Ambulatory Visit: Admit: 2021-03-26 | Discharge: 2021-03-26 | Payer: No Typology Code available for payment source

## 2021-03-26 DIAGNOSIS — E7800 Pure hypercholesterolemia, unspecified: Secondary | ICD-10-CM

## 2021-03-26 DIAGNOSIS — Z8659 Personal history of other mental and behavioral disorders: Secondary | ICD-10-CM

## 2021-03-26 DIAGNOSIS — R1319 Other dysphagia: Secondary | ICD-10-CM

## 2021-03-26 DIAGNOSIS — Z6835 Body mass index (BMI) 35.0-35.9, adult: Secondary | ICD-10-CM

## 2021-03-26 DIAGNOSIS — F99 Mental disorder, not otherwise specified: Secondary | ICD-10-CM

## 2021-03-26 DIAGNOSIS — E785 Hyperlipidemia, unspecified: Secondary | ICD-10-CM

## 2021-03-26 DIAGNOSIS — Q615 Medullary cystic kidney: Secondary | ICD-10-CM

## 2021-03-26 DIAGNOSIS — G4730 Sleep apnea, unspecified: Secondary | ICD-10-CM

## 2021-03-26 DIAGNOSIS — R12 Heartburn: Secondary | ICD-10-CM

## 2021-03-26 DIAGNOSIS — N390 Urinary tract infection, site not specified: Secondary | ICD-10-CM

## 2021-03-26 DIAGNOSIS — G4733 Obstructive sleep apnea (adult) (pediatric): Secondary | ICD-10-CM

## 2021-03-26 DIAGNOSIS — F32A Depressed: Secondary | ICD-10-CM

## 2021-03-26 DIAGNOSIS — I499 Cardiac arrhythmia, unspecified: Secondary | ICD-10-CM

## 2021-03-26 DIAGNOSIS — R000 Tachycardia, unspecified: Secondary | ICD-10-CM

## 2021-03-26 DIAGNOSIS — Z7689 Persons encountering health services in other specified circumstances: Secondary | ICD-10-CM

## 2021-03-26 DIAGNOSIS — D126 Benign neoplasm of colon, unspecified: Secondary | ICD-10-CM

## 2021-03-26 DIAGNOSIS — F4310 Post-traumatic stress disorder, unspecified: Secondary | ICD-10-CM

## 2021-03-26 DIAGNOSIS — K219 Gastro-esophageal reflux disease without esophagitis: Secondary | ICD-10-CM

## 2021-03-26 DIAGNOSIS — E039 Hypothyroidism, unspecified: Secondary | ICD-10-CM

## 2021-03-26 DIAGNOSIS — Z789 Other specified health status: Secondary | ICD-10-CM

## 2021-03-26 DIAGNOSIS — R0989 Other specified symptoms and signs involving the circulatory and respiratory systems: Secondary | ICD-10-CM

## 2021-03-26 NOTE — Progress Notes
Date of Service: 03/26/2021    Subjective:             Holly Maldonado is a 60 y.o. female.    History of Present Illness    I had a new patient appointment with Holly Maldonado.  She is a nice 60 year old female patient.  Her main complaint is difficulty swallowing and chest discomfort.  She states she has had symptoms for more than a year.  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  She also follows with allergy clinic for seasonal allergies and hayfever.  The patient had a colonoscopy in August 2020 and 1 3 mm polyp removed.  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  7-year surveillance was recommended.  Patient also had some hemorrhoids.  She describes feeling swollen in her neck with some tightness and difficulty swallowing.  She also has chest discomfort and burning and dry cough.  Sometimes she can feel food going down as she swallows.  She has tried Nexium for 14 days a few times and felt like it helped when she took it.  After discontinuing it her symptoms recur returned.  She carries some over-the-counter acid pills which are famotidine 20 mg tablets.  She takes these as needed with some improvement in her symptoms.  She has never been on chronic antacid medications.  She has never had an upper endoscopy.  She denies regurgitation or food bolus impaction in her esophagus.  She does not have volume regurgitation or reflux.  She does not have a history of asthma.  She denies unintentional weight loss or gain recently.  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  Her mother died of leukemia.  There is no known family history of gastrointestinal cancers.  She has not been to the emergency department with food impactions.  The food always goes down eventually.  She sometimes needs to drink more water.  She was given omeprazole 40 mg daily by urgent care yesterday.  She has not yet started it.  She had 2 laparoscopies for endometriosis at age 28 and 81 and she had a C-section and sinus surgeries.       Review of Systems   Constitutional: Negative.    HENT: Positive for trouble swallowing.    Eyes: Negative.    Respiratory: Positive for cough and choking.    Cardiovascular: Negative.    Gastrointestinal: Negative.    Endocrine: Negative.    Genitourinary: Negative.    Musculoskeletal: Negative.    Skin: Negative.    Allergic/Immunologic: Negative.    Neurological: Negative.    Hematological: Negative.    Psychiatric/Behavioral: Negative.    All other systems reviewed and are negative.    Objective:         ? albuterol sulfate (PROAIR HFA) 90 mcg/actuation HFA aerosol inhaler Inhale two puffs by mouth into the lungs every 6 hours as needed. Shake well before use.   ? aspirin EC 81 mg tablet Take 81 mg by mouth daily. Every other day.   ? AUVI-Q 0.3 mg/0.3 mL auto-injector Inject 0.3 mg (1 Pen) into thigh if needed for anaphylactic reaction. May repeat in 5-15 minutes if needed.   ? benzonatate (TESSALON PERLES) 100 mg capsule Take one capsule by mouth every 8 hours.   ? Cholecalciferol (Vitamin D3) 1,000 unit cap Take 5,000 Units by mouth.   ? cyproheptadine (PERIACTIN)  4 mg tablet Take one tablet by mouth at bedtime daily.   ? docosahexaenoic acid/epa (FISH OIL PO) Take 3 capsules by mouth daily.   ? esketamine (SPRAVATO) 56 mg (28 mg x 2) nasal spray Insert four sprays into nose as directed twice weekly. Wait 5 minutes between use of each device.  Indications: additional treatment for major depressive disorder   ? esketamine (SPRAVATO) 84 mg (28 mg x 3) nasal spray Insert six sprays into nose as directed twice weekly. Wait 5 minutes between use of each device.  Indications: additional treatment for major depressive disorder   ? estradioL (VIVELLE-DOT) 0.075 mg/24 hr patch APPLY 1 PATCH TOPICALLY TWICE A WEEK   ? ezetimibe (ZETIA) 10 mg tablet Take one tablet by mouth daily.   ? flecainide (TAMBOCOR) 100 mg tablet Take one tablet by mouth twice daily. (Patient taking differently: Take 50 mg by mouth twice daily.)   ? flunisolide 25 mcg (0.025 %) nasal spray Apply one spray to each nostril as directed twice daily.   ? fluoxetine (PROZAC) 40 mg capsule Take two capsules by mouth daily for 90 days. Indications: bulimia, major depressive disorder, posttraumatic stress syndrome   ? hydrOXYzine (ATARAX) 10 mg tablet Take one tablet by mouth twice daily as needed for Anxiety.   ? ketotifen (ZADITOR) 0.025 % (0.035 %) ophthalmic solution Place one drop into or around eye(s) twice daily as needed.   ? lamoTRIgine (LAMICTAL) 25 mg tablet Take three tablets by mouth daily.   ? levothyroxine (SYNTHROID) 100 mcg tablet Take one tablet by mouth daily 30 minutes before breakfast.   ? magnesium oxide (MAG-OX) 400 mg (241.3 mg magnesium) tablet Take 400 mg by mouth daily.   ? olopatadine (PATANASE) 0.6 % nasal spray Apply one spray to two sprays to each nostril as directed twice daily as needed.   ? omeprazole DR (PRILOSEC) 40 mg capsule Take one capsule by mouth daily before breakfast.   ? rOPINIRole (REQUIP) 0.5 mg tablet Take 0.5 mg by mouth at bedtime daily.   ? rosuvastatin (CRESTOR) 10 mg tablet Take one tablet by mouth daily.   ? zinc sulfate 220 mg (50 mg elemental zinc) capsule Take 220 mg by mouth daily.     Vitals:    03/26/21 0810   BP: 126/78   BP Source: Arm, Right Upper   Pulse: 77   Resp: 16   PainSc: Zero   Weight: 94.8 kg (209 lb 1.6 oz)   Height: 162.6 cm (5' 4)     Body mass index is 35.89 kg/m?Marland Kitchen     Physical Exam  Pt alert and oriented, vital signs reviewed, patient afebrile  No jaundice, scleral icterus  No rashes  HEENT normal, PERRLA  Lungs clear bilaterally  Heart Regular  Abdomen soft, non-tender, non-distended, no rebound or guarding, normal bowel sounds  No edema, adequate pulses bilaterally  Neurologic exam is non-focal  No lymphadenopathy       Assessment and Plan:           Holly Maldonado was seen today for esophageal reflux and dysphagia.    Diagnoses and all orders for this visit:    Esophageal dysphagia    Globus pharyngeus    Heartburn    Establishing care with new doctor, encounter for    Colon adenoma    Mrs. Ames has some heartburn and difficulty swallowing.  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  She has never had upper endoscopy.  Her symptoms may all be the sequela of acid reflux.  I have recommended upper endoscopy to look for erosive esophagitis or hiatal hernia.  I would plan on taking biopsies to evaluate for eosinophilic esophagitis at the same time.  If we can get the EGD done soon I have recommended that she take her over-the-counter famotidine until the upper endoscopy so we can see if there is any erosive esophagitis.  If the EGD is going to be a while from now, I have suggested she start the omeprazole 40 mg daily to see if this decreases her symptoms.  Depending on the findings at time of upper endoscopy, the patient might benefit from a swallow motion series and or esophageal manometry to further evaluate her symptoms.  I do note that the patient will be due for surveillance colonoscopy in 2027.  I will schedule Mrs. Demby to return to GI clinic for continued follow-up.    Patient Instructions     1. Schedule EGD at [facility] (biopsy for EoE).  [facility] Scheduler [phone]  2. If EGD is soon, take OTC famotidine 20 mg twice daily  3. IF EGD is going to be >two weeks, go ahead and start the Omeprazole 40 mg daily - take 30 minutes before breakfast  4. Further testing might include a swallow motion series and esophageal manometry depending on scoipe results and how you respond to medications  5. Colonoscopy in 2027 due to polyp     If you have questions or concerns that need to be addressed before your next scheduled office visit, please call my nurse at [phone] or send me a MyChart patient message.    A total of 30 minutes were spent today on this encounter.  This includes preparing for the visit, reviewing medical records, obtaining relevant history, speaking to the patient, interpreting test results, ordering medications and tests and documenting the visit in the electronic health record.

## 2021-03-26 NOTE — Progress Notes
Date of Service: 03/26/2021    Subjective:             Holly Maldonado is a 60 y.o. female.    History of Present Illness    Holly Maldonado was referred by Dr. Andreas Newport for [name] discussion.  She reports that she was getting her sleep care at [name] hospital with Dr. Mal [name], but they are no longer taking her insurance ([employer]).  Her last visit with me was in November 2021 and her initial Holly Maldonado evaluation was with me in December 2019.  She has NOT had DISE, because my kids and family got in the way.  She has not had a sleep study since 07/23/2016.  At that time her BMI was 26 and her AHI was 23.7.  She had 0 central or mixed apnea events at that time.    She has tried CPAP but currently is not treated for her disease.  Her biggest problem with CPAP is fight the mask and machine all night.  She best tolerated nasal pillow (history PTSD and maxillary fracture), but had air leak and couldn't sleep with chin strap or mouth taped shut.  She has a pending [place] sleep appointment in early Jan, 2023.    She had such severe scarring after tonsillectomy (age 7) that some doctors think she still has her tonsils.  She also reports near dying due to bleeding at that time and needed a blood transfusion.    Medical History:   Diagnosis Date   ? Arrhythmia     multiple PVCs from EKG 08/14/12   ? depression    ? Dyslipidemia    ? Endometriosis    ? Esophageal reflux    ? H/O eating disorder    ? Hematuria     microscopic   ? High cholesterol    ? Hypothyroid    ? Medullary sponge kidney    ? Mental disorder    ? Nephrocalcinosis    ? PTSD (post-traumatic stress disorder)    ? Recurrent UTI    ? Sleep apnea    ? Tachycardia      Surgical History:   Procedure Laterality Date   ? HX TUBAL LIGATION  2010   ? COLONOSCOPY DIAGNOSTIC WITH SPECIMEN COLLECTION BY BRUSHING/ WASHING - FLEXIBLE N/A 12/15/2018    Performed by Veneta Penton, MD at [facility] OR   ? ELECTROCARDIOGRAM     ? HX BLADDER SUSPENSION     ? HX CESAREAN SECTION     ? HX HYSTERECTOMY     ? HX SINUS SURGERY     ? HX TONSILLECTOMY     ? LAPAROSCOPY       Family History   Problem Relation Age of Onset   ? Coronary Artery Disease Father    ? Diabetes Type II Mother    ? Cancer Mother         leukemia   ? Diabetes Mother    ? Heart Attack Mother    ? Coronary Artery Disease Maternal Grandmother    ? Circulatory problem Maternal Grandmother    ? Coronary Artery Disease Maternal Grandfather    ? Heart Attack Maternal Grandfather    ? Heart Surgery Maternal Grandfather    ? Coronary Artery Disease Paternal Grandmother    ? Coronary Artery Disease Paternal Grandfather    ? None Reported Brother    ? None Reported Son    ? None Reported Daughter    ? None  Reported Daughter    ? None Reported Daughter    ? None Reported Son    ? Glaucoma Neg Hx    ? Macular Degen Neg Hx    ? Strabismus Neg Hx    ? Retinal Detachment Neg Hx    ? Blindness Neg Hx    ? Cataract Neg Hx    ? Amblyopia Neg Hx      Social History     Tobacco Use   Smoking Status Former   ? Packs/day: 1.00   ? Types: Cigarettes   ? Quit date: 08/22/1992   ? Years since quitting: 28.6   Smokeless Tobacco Never     Social History     Substance and Sexual Activity   Drug Use No       PMH, SH, FH, allergies and medications above have been reviewed.       Review of Systems   Constitutional: Negative.    HENT: Positive for congestion.    Eyes: Negative.    Respiratory: Positive for apnea and cough.    Cardiovascular: Negative.    Gastrointestinal: Negative.    Endocrine: Negative.    Genitourinary: Negative.    Musculoskeletal: Negative.    Skin: Negative.    Allergic/Immunologic: Negative.    Neurological: Negative.    Hematological: Negative.    Psychiatric/Behavioral: Positive for sleep disturbance.         Objective:         ? albuterol sulfate (PROAIR HFA) 90 mcg/actuation HFA aerosol inhaler Inhale two puffs by mouth into the lungs every 6 hours as needed. Shake well before use.   ? aspirin EC 81 mg tablet Take 81 mg by mouth daily. Every other day.   ? AUVI-Q 0.3 mg/0.3 mL auto-injector Inject 0.3 mg (1 Pen) into thigh if needed for anaphylactic reaction. May repeat in 5-15 minutes if needed.   ? benzonatate (TESSALON PERLES) 100 mg capsule Take one capsule by mouth every 8 hours.   ? Cholecalciferol (Vitamin D3) 1,000 unit cap Take 5,000 Units by mouth.   ? cyproheptadine (PERIACTIN) 4 mg tablet Take one tablet by mouth at bedtime daily.   ? docosahexaenoic acid/epa (FISH OIL PO) Take 3 capsules by mouth daily.   ? esketamine (SPRAVATO) 56 mg (28 mg x 2) nasal spray Insert four sprays into nose as directed twice weekly. Wait 5 minutes between use of each device.  Indications: additional treatment for major depressive disorder   ? esketamine (SPRAVATO) 84 mg (28 mg x 3) nasal spray Insert six sprays into nose as directed twice weekly. Wait 5 minutes between use of each device.  Indications: additional treatment for major depressive disorder   ? estradioL (VIVELLE-DOT) 0.075 mg/24 hr patch APPLY 1 PATCH TOPICALLY TWICE A WEEK   ? ezetimibe (ZETIA) 10 mg tablet Take one tablet by mouth daily.   ? flecainide (TAMBOCOR) 100 mg tablet Take one tablet by mouth twice daily. (Patient taking differently: Take 50 mg by mouth twice daily.)   ? flunisolide 25 mcg (0.025 %) nasal spray Apply one spray to each nostril as directed twice daily.   ? fluoxetine (PROZAC) 40 mg capsule Take two capsules by mouth daily for 90 days. Indications: bulimia, major depressive disorder, posttraumatic stress syndrome   ? hydrOXYzine (ATARAX) 10 mg tablet Take one tablet by mouth twice daily as needed for Anxiety.   ? ketotifen (ZADITOR) 0.025 % (0.035 %) ophthalmic solution Place one drop into or around eye(s) twice daily as  needed.   ? lamoTRIgine (LAMICTAL) 25 mg tablet Take three tablets by mouth daily.   ? levothyroxine (SYNTHROID) 100 mcg tablet Take one tablet by mouth daily 30 minutes before breakfast.   ? magnesium oxide (MAG-OX) 400 mg (241.3 mg magnesium) tablet Take 400 mg by mouth daily.   ? olopatadine (PATANASE) 0.6 % nasal spray Apply one spray to two sprays to each nostril as directed twice daily as needed.   ? omeprazole DR (PRILOSEC) 40 mg capsule Take one capsule by mouth daily before breakfast.   ? rOPINIRole (REQUIP) 0.5 mg tablet Take 0.5 mg by mouth at bedtime daily.   ? rosuvastatin (CRESTOR) 10 mg tablet Take one tablet by mouth daily.   ? zinc sulfate 220 mg (50 mg elemental zinc) capsule Take 220 mg by mouth daily.     Vitals:    03/26/21 1258   BP: 126/78   Pulse: 77   Temp: 36.9 ?C (98.4 ?F)   PainSc: Zero   Weight: 94.8 kg (209 lb)   Height: 162.6 cm (5' 4)     Body mass index is 35.87 kg/m?Marland Kitchen     Physical Exam  General: Well-developed, well-nourished   Communication and Voice: Clear pitch and clarity   Hearing: Hearing adequate for verbal communication bilaterally   Inspection: Normocephalic and atraumatic without mass or lesion   Palpation: Facial skeleton intact without bony stepoffs.    Facial Strength: Facial motility symmetric and full bilaterally   Pinna: External ear intact and fully developed   External canal: Canal is patent with intact skin   Tympanic Membrane: Normal bilaterally   External nose: No scar or anatomic deformity   Internal Nose: Septum intact.  MMM.  Turbinates 2+  Oral cavity, Lips, Teeth, and Gums: Mucosa and teeth intact and viable, No lesions, masses or ulcers   Oropharynx: No erythema or exudate, no masses or ulcerations, scarring (minimal palatopharyngeus) from prior tonsillectomy.  Larynx: Normal voice, no stridor or stertor.   Neck, Trachea, Lymphatics: Midline trachea without mass or lesion, no lymphadenopathy   Thyroid: No mass or nodularity   Eyes: No nystagmus with equal extraocular motion bilaterally   Neuro/Psych/Balance: Patient oriented and appropriate in interaction; Appropriate mood and affect; Gait is intact with no imbalance; Cranial nerves I-XII are intact   Respiratory effort: Equal inspiration and expiration, no respiratory distress   Peripheral Vascular: Warm extremities with equal distal pulses    PSG data and report requested and reviewed prior to visit, summarized in HPI.    Differential Diagnosis:  Deviated nasal septum, lateral pharyngeal wall redundancy, uvular hypertrophy, lingual tonsil hypertrophy, epiglottic prolapse/laryngomalasia, deviated nasal septum, macroglossia, poor neurologic tone, adenoid hypertrophy, allergic rhinitis,  obstructive or mixed or central sleep apnea, turbinate hypertrophy       Assessment and Plan:  1. OSA (obstructive sleep apnea)        2. Intolerance of continuous positive airway pressure (CPAP) ventilation        3. BMI 35.0-35.9,adult          Interval weight loss highly recommended, goal BMI less than 32, ideally less than 25.  The patient will drink more water, chew slowly, and refrain from eating when not hungry.  Consider enrolling in the Naturally Slim/[facility]ond[facility] or Noom program for structured advice and detailed recommendations.      Weight loss was encouraged as medical literature shows that with each 1 point reduction in BMI there is a 9% improved probability of surgical success with upper airway  stimulation surgery according to the [facility] registry.    Pros and cons to oral appliance therapy and surgical intervention, including UPPP, hyoid myotomy with suspension and Inspire, were reviewed at length.  The patient is interested in surgery.  Risks, benefits, rationale, and alternatives to Drug Induced Sleep Endoscopy was discussed with the patient.  She understands that this is a necessary step to determine candidacy for upper airway surgery or Hypoglossal Nerve Stimulation therapy (which will be discussed further if DISE confirms that the patient is a good candidate).  The patient's goal is to eliminate need for CPAP therapy.  They understand that the primary risk with this surgery is epistaxis or medical complication from brief general anesthetic.  Ideal HNS candidates have poor CPAP tolerance, a BMI less than 32 (but occasionally benefit up to 35), an AHI of 15-65+ (with less than 25% mixed or central apneas), and lack of circumferential airway collapse at the velopharynx level on DISE.  She will also be referred to [facility] website for further information regarding the device:     [email or web address]    The patient is eager to proceed with testing and understands the importance of using CPAP until post surgery PSG (3 months post op with device titration) deems it unnecessary.  Risks of untreated OSA, including but not limited to stroke, MI, death (MVA/work accident), atrial fibrillation, and diabetes mellitus were all discussed and treatment encouraged  They also understand the importance of not driving when fatigued and using extra caution if driving at night.  The patient understands that they may be limited in getting an MRI in the future.    Based on DISE outcome, we will further counsel the patient about surgery treatment options and expected outcomes with each.    She will also need a new diagnostic PSG ([facility] or [facility] prior to clinic visit in Jan) as her last was 2018.    Thanks for allowing me to participate in the care of this pleasant patient.

## 2021-03-27 MED ORDER — HYDROXYZINE HCL 10 MG PO TAB
10 mg | ORAL_TABLET | Freq: Two times a day (BID) | ORAL | 0 refills | PRN
Start: 2021-03-27 — End: ?

## 2021-04-09 ENCOUNTER — Encounter: Admit: 2021-04-09 | Discharge: 2021-04-09 | Payer: No Typology Code available for payment source

## 2021-04-09 NOTE — Progress Notes
The Prior Authorization for spravato has been submitted for [employer] via Cover My Meds.  Will continue to follow.    [place] Patient Advocate  [phone]

## 2021-04-10 ENCOUNTER — Encounter: Admit: 2021-04-10 | Discharge: 2021-04-10 | Payer: No Typology Code available for payment source

## 2021-04-10 NOTE — Progress Notes
The Prior Authorization for spravato was approved for LENORIA NARINE from 04/10/2021 to 10/09/2021.  The copay is $0.  The PA authorization number is [id number].        [place] Patient Advocate  [phone]

## 2021-04-13 ENCOUNTER — Encounter: Admit: 2021-04-13 | Discharge: 2021-04-13 | Payer: No Typology Code available for payment source

## 2021-04-21 ENCOUNTER — Encounter: Admit: 2021-04-21 | Discharge: 2021-04-21 | Payer: No Typology Code available for payment source

## 2021-04-28 ENCOUNTER — Encounter: Admit: 2021-04-28 | Discharge: 2021-04-28 | Payer: No Typology Code available for payment source

## 2021-04-28 DIAGNOSIS — K219 Gastro-esophageal reflux disease without esophagitis: Secondary | ICD-10-CM

## 2021-04-28 DIAGNOSIS — R000 Tachycardia, unspecified: Secondary | ICD-10-CM

## 2021-04-28 DIAGNOSIS — E039 Hypothyroidism, unspecified: Secondary | ICD-10-CM

## 2021-04-28 DIAGNOSIS — G4733 Obstructive sleep apnea (adult) (pediatric): Secondary | ICD-10-CM

## 2021-04-28 DIAGNOSIS — E785 Hyperlipidemia, unspecified: Secondary | ICD-10-CM

## 2021-04-28 DIAGNOSIS — I499 Cardiac arrhythmia, unspecified: Secondary | ICD-10-CM

## 2021-04-28 DIAGNOSIS — N390 Urinary tract infection, site not specified: Secondary | ICD-10-CM

## 2021-04-28 DIAGNOSIS — F32A Depressed: Secondary | ICD-10-CM

## 2021-04-28 DIAGNOSIS — G4730 Sleep apnea, unspecified: Secondary | ICD-10-CM

## 2021-04-28 DIAGNOSIS — Z789 Other specified health status: Secondary | ICD-10-CM

## 2021-04-28 DIAGNOSIS — Q615 Medullary cystic kidney: Secondary | ICD-10-CM

## 2021-04-28 DIAGNOSIS — F99 Mental disorder, not otherwise specified: Secondary | ICD-10-CM

## 2021-04-28 DIAGNOSIS — E7800 Pure hypercholesterolemia, unspecified: Secondary | ICD-10-CM

## 2021-04-28 DIAGNOSIS — Z8659 Personal history of other mental and behavioral disorders: Secondary | ICD-10-CM

## 2021-04-28 DIAGNOSIS — F4310 Post-traumatic stress disorder, unspecified: Secondary | ICD-10-CM

## 2021-04-28 MED ORDER — GLYCOPYRROLATE 0.2 MG/ML IJ SOLN
.2 mg | Freq: Once | INTRAVENOUS | 0 refills
Start: 2021-04-28 — End: ?

## 2021-04-29 ENCOUNTER — Ambulatory Visit: Admit: 2021-04-29 | Discharge: 2021-04-29 | Payer: No Typology Code available for payment source

## 2021-04-29 ENCOUNTER — Encounter: Admit: 2021-04-29 | Discharge: 2021-04-29 | Payer: No Typology Code available for payment source

## 2021-04-29 DIAGNOSIS — G4733 Obstructive sleep apnea (adult) (pediatric): Secondary | ICD-10-CM

## 2021-04-29 DIAGNOSIS — Z789 Other specified health status: Secondary | ICD-10-CM

## 2021-05-05 ENCOUNTER — Encounter: Admit: 2021-05-05 | Discharge: 2021-05-05 | Payer: No Typology Code available for payment source

## 2021-05-06 ENCOUNTER — Encounter: Admit: 2021-05-06 | Discharge: 2021-05-06 | Payer: No Typology Code available for payment source

## 2021-05-06 ENCOUNTER — Ambulatory Visit: Admit: 2021-05-06 | Discharge: 2021-05-06 | Payer: No Typology Code available for payment source

## 2021-05-06 DIAGNOSIS — K219 Gastro-esophageal reflux disease without esophagitis: Secondary | ICD-10-CM

## 2021-05-06 DIAGNOSIS — F4310 Post-traumatic stress disorder, unspecified: Secondary | ICD-10-CM

## 2021-05-06 DIAGNOSIS — Q615 Medullary cystic kidney: Secondary | ICD-10-CM

## 2021-05-06 DIAGNOSIS — G4730 Sleep apnea, unspecified: Secondary | ICD-10-CM

## 2021-05-06 DIAGNOSIS — E7800 Pure hypercholesterolemia, unspecified: Secondary | ICD-10-CM

## 2021-05-06 DIAGNOSIS — R000 Tachycardia, unspecified: Secondary | ICD-10-CM

## 2021-05-06 DIAGNOSIS — F99 Mental disorder, not otherwise specified: Secondary | ICD-10-CM

## 2021-05-06 DIAGNOSIS — E039 Hypothyroidism, unspecified: Secondary | ICD-10-CM

## 2021-05-06 DIAGNOSIS — Z8659 Personal history of other mental and behavioral disorders: Secondary | ICD-10-CM

## 2021-05-06 DIAGNOSIS — F32A Depressed: Secondary | ICD-10-CM

## 2021-05-06 DIAGNOSIS — N390 Urinary tract infection, site not specified: Secondary | ICD-10-CM

## 2021-05-06 DIAGNOSIS — I499 Cardiac arrhythmia, unspecified: Secondary | ICD-10-CM

## 2021-05-06 DIAGNOSIS — E785 Hyperlipidemia, unspecified: Secondary | ICD-10-CM

## 2021-05-06 MED ORDER — GLYCOPYRROLATE 0.2 MG/ML IJ SOLN
INTRAVENOUS | 0 refills | Status: DC
Start: 2021-05-06 — End: 2021-05-06

## 2021-05-06 MED ORDER — PROPOFOL INJ 10 MG/ML IV VIAL
INTRAVENOUS | 0 refills | Status: DC
Start: 2021-05-06 — End: 2021-05-06

## 2021-05-06 MED ORDER — LIDOCAINE (PF) 20 MG/ML (2 %) IJ SOLN
INTRAVENOUS | 0 refills | Status: DC
Start: 2021-05-06 — End: 2021-05-06

## 2021-05-06 MED ORDER — PROPOFOL 10 MG/ML IV EMUL 20 ML (INFUSION)(AM)(OR)
INTRAVENOUS | 0 refills | Status: DC
Start: 2021-05-06 — End: 2021-05-06

## 2021-05-07 ENCOUNTER — Encounter: Admit: 2021-05-07 | Discharge: 2021-05-07 | Payer: No Typology Code available for payment source

## 2021-05-07 DIAGNOSIS — R000 Tachycardia, unspecified: Secondary | ICD-10-CM

## 2021-05-07 DIAGNOSIS — F99 Mental disorder, not otherwise specified: Secondary | ICD-10-CM

## 2021-05-07 DIAGNOSIS — Z8659 Personal history of other mental and behavioral disorders: Secondary | ICD-10-CM

## 2021-05-07 DIAGNOSIS — E7800 Pure hypercholesterolemia, unspecified: Secondary | ICD-10-CM

## 2021-05-07 DIAGNOSIS — F4310 Post-traumatic stress disorder, unspecified: Secondary | ICD-10-CM

## 2021-05-07 DIAGNOSIS — G4730 Sleep apnea, unspecified: Secondary | ICD-10-CM

## 2021-05-07 DIAGNOSIS — Q615 Medullary cystic kidney: Secondary | ICD-10-CM

## 2021-05-07 DIAGNOSIS — K219 Gastro-esophageal reflux disease without esophagitis: Secondary | ICD-10-CM

## 2021-05-07 DIAGNOSIS — E039 Hypothyroidism, unspecified: Secondary | ICD-10-CM

## 2021-05-07 DIAGNOSIS — E785 Hyperlipidemia, unspecified: Secondary | ICD-10-CM

## 2021-05-07 DIAGNOSIS — N390 Urinary tract infection, site not specified: Secondary | ICD-10-CM

## 2021-05-07 DIAGNOSIS — F32A Depressed: Secondary | ICD-10-CM

## 2021-05-07 DIAGNOSIS — I499 Cardiac arrhythmia, unspecified: Secondary | ICD-10-CM

## 2021-05-08 ENCOUNTER — Encounter: Admit: 2021-05-08 | Discharge: 2021-05-08 | Payer: No Typology Code available for payment source

## 2021-05-09 ENCOUNTER — Encounter: Admit: 2021-05-09 | Discharge: 2021-05-09 | Payer: No Typology Code available for payment source

## 2021-05-12 ENCOUNTER — Encounter: Admit: 2021-05-12 | Discharge: 2021-05-12 | Payer: No Typology Code available for payment source

## 2021-05-12 ENCOUNTER — Ambulatory Visit: Admit: 2021-05-12 | Discharge: 2021-05-12 | Payer: No Typology Code available for payment source

## 2021-05-12 DIAGNOSIS — E785 Hyperlipidemia, unspecified: Secondary | ICD-10-CM

## 2021-05-12 DIAGNOSIS — F32A Depressed: Secondary | ICD-10-CM

## 2021-05-12 DIAGNOSIS — E7800 Pure hypercholesterolemia, unspecified: Secondary | ICD-10-CM

## 2021-05-12 DIAGNOSIS — E039 Hypothyroidism, unspecified: Secondary | ICD-10-CM

## 2021-05-12 DIAGNOSIS — K219 Gastro-esophageal reflux disease without esophagitis: Secondary | ICD-10-CM

## 2021-05-12 DIAGNOSIS — F99 Mental disorder, not otherwise specified: Secondary | ICD-10-CM

## 2021-05-12 DIAGNOSIS — I499 Cardiac arrhythmia, unspecified: Secondary | ICD-10-CM

## 2021-05-12 DIAGNOSIS — G4730 Sleep apnea, unspecified: Secondary | ICD-10-CM

## 2021-05-12 DIAGNOSIS — N390 Urinary tract infection, site not specified: Secondary | ICD-10-CM

## 2021-05-12 DIAGNOSIS — G4733 Obstructive sleep apnea (adult) (pediatric): Secondary | ICD-10-CM

## 2021-05-12 DIAGNOSIS — R000 Tachycardia, unspecified: Secondary | ICD-10-CM

## 2021-05-12 DIAGNOSIS — Z6835 Body mass index (BMI) 35.0-35.9, adult: Secondary | ICD-10-CM

## 2021-05-12 DIAGNOSIS — Q615 Medullary cystic kidney: Secondary | ICD-10-CM

## 2021-05-12 DIAGNOSIS — J302 Other seasonal allergic rhinitis: Secondary | ICD-10-CM

## 2021-05-12 DIAGNOSIS — Z8659 Personal history of other mental and behavioral disorders: Secondary | ICD-10-CM

## 2021-05-12 DIAGNOSIS — F4310 Post-traumatic stress disorder, unspecified: Secondary | ICD-10-CM

## 2021-05-12 NOTE — Assessment & Plan Note
Patient has history of moderate obstructive sleep apnea.  Patient reportedly did not tolerate CPAP therapy.  Patient was evaluated by Dr. Junious Silk for inspire placement she is scheduled for DICE in February 2023.  Patient qualifies for hypoglossal nerve stimulation implantation.  She meets the criteria since her AHI is 22.9/hr.  Her BMI is 35.21, and she failed CPAP therapy.  We discussed with patient if she qualifies for inspire implantation, we will follow-up with her for activation and further management.  In case if she did not qualify for inspire implantation, we will follow-up with her and help with her CPAP use.

## 2021-05-12 NOTE — Patient Instructions
My nurse is Brenisha Tsui, RN . She can be reached at [phone].    Please contact my nurse with any questions regarding your appointment.    If you need to schedule or reschedule an appointment, please contact [phone].    For refills on medications, please have your pharmacy fax a refill authorization request form to our office at [phone]. Please allow at least 3 business days for refill requests.    For urgent issues after business hours/weekends/holidays call [phone] and request for the pulmonary fellow to be paged.

## 2021-05-12 NOTE — Progress Notes
Date of Service: 05/12/2021    Subjective:             Holly Maldonado is a 61 y.o. female.    History of Present Illness  Patient with past medical history of OSA on CPAP for 15-18 year. Patient is here to establish care.     Sleep studies order by Dr. [name], which showed moderate sleep apnea. She followed Dr. Ellwood Handler at [facility] last 02/2020.    Patient is on ResMed started on 2018 and last use was 11/2020. Reports she does not tolerate CPAP. She tried different mask with chin strap did didn't help.    Patient was seen by Dr. Ewing Schlein 11/17 for inspire evaluation.     The patient goes to bed about 10:00 PM. Denies trouble falling in sleep (sleep latency 5 mins) and denies trouble staying sleep, which is associated without multiple awakenings. She wakes up around 8:00 AM. She feels tired upon awakening. she reports snoring and possible observed apneas.   Often gets tired while watching TV, reading, and in low stimulus environments.  Reports sleepiness while driving.  Denies motor vehicle accidents secondary to excessive sleepiness.  Memory and concentration are ok.  Morning headaches sometimes present. She denies any hypnagogic or hypnopompic hallucinations.  No cataplexy symptoms and no sleep paralysis are noted.  she estimates a 20-pound weight gain over the last 6 months.    Restless legs absent, absent Leg kicking, sleep taking, sleep waking.    Takes daytime naps 2-3 hour naps. Naps are not refreshing.    Personal History:   Drinks 1 cup of coffee.   Alcohol: Denies   Smoking: Denies   Medications: Atarax 10 mg 2-3 times a week for sleep and anxiety      DIAGNOSTIC STUDIES:  The patient completed the patient health questionnaire. The Epworth Sleepiness Scale (ESS) was 21.    Home Sleep Study: 04/12/2021  AHI 22.9 Spo@ nadr: 84%       Review of Systems   Constitutional: Negative.    HENT: Negative.    Eyes: Negative.    Respiratory: Negative.    Cardiovascular: Negative.    Gastrointestinal: Negative.    Genitourinary: Negative.    Musculoskeletal: Negative.    Skin: Negative.    Neurological: Negative.    Psychiatric/Behavioral: Positive for sleep disturbance.         Objective:         ? albuterol sulfate (PROAIR HFA) 90 mcg/actuation HFA aerosol inhaler Inhale two puffs by mouth into the lungs every 6 hours as needed. Shake well before use.   ? aspirin EC 81 mg tablet Take 81 mg by mouth daily. Every other day.   ? AUVI-Q 0.3 mg/0.3 mL auto-injector Inject 0.3 mg (1 Pen) into thigh if needed for anaphylactic reaction. May repeat in 5-15 minutes if needed.   ? benzonatate (TESSALON PERLES) 100 mg capsule Take one capsule by mouth every 8 hours.   ? Cholecalciferol (Vitamin D3) 1,000 unit cap Take 5,000 Units by mouth.   ? docosahexaenoic acid/epa (FISH OIL PO) Take 3 capsules by mouth daily.   ? esketamine (SPRAVATO) 56 mg (28 mg x 2) nasal spray Insert four sprays into nose as directed twice weekly. Wait 5 minutes between use of each device.  Indications: additional treatment for major depressive disorder   ? esketamine (SPRAVATO) 84 mg (28 mg x 3) nasal spray Insert six sprays into nose as directed twice weekly. Wait 5 minutes  between use of each device.  Indications: additional treatment for major depressive disorder   ? estradioL (VIVELLE-DOT) 0.075 mg/24 hr patch APPLY 1 PATCH TOPICALLY TWICE A WEEK   ? ezetimibe (ZETIA) 10 mg tablet Take one tablet by mouth daily.   ? flecainide (TAMBOCOR) 100 mg tablet Take one tablet by mouth twice daily. (Patient taking differently: Take 50 mg by mouth twice daily.)   ? flunisolide 25 mcg (0.025 %) nasal spray Apply one spray to each nostril as directed twice daily.   ? fluoxetine (PROZAC) 40 mg capsule Take two capsules by mouth daily for 90 days. Indications: bulimia, major depressive disorder, posttraumatic stress syndrome   ? hydrOXYzine (ATARAX) 10 mg tablet Take one tablet by mouth twice daily as needed for Anxiety.   ? ketotifen (ZADITOR) 0.025 % (0.035 %) ophthalmic solution Place one drop into or around eye(s) twice daily as needed.   ? lamoTRIgine (LAMICTAL) 25 mg tablet Take three tablets by mouth daily.   ? levothyroxine (SYNTHROID) 100 mcg tablet Take one tablet by mouth daily 30 minutes before breakfast.   ? magnesium oxide (MAG-OX) 400 mg (241.3 mg magnesium) tablet Take 400 mg by mouth daily.   ? olopatadine (PATANASE) 0.6 % nasal spray Apply one spray to two sprays to each nostril as directed twice daily as needed.   ? omeprazole DR (PRILOSEC) 40 mg capsule Take one capsule by mouth daily before breakfast.   ? rosuvastatin (CRESTOR) 10 mg tablet Take one tablet by mouth daily.   ? zinc sulfate 220 mg (50 mg elemental zinc) capsule Take 220 mg by mouth daily.     Vitals:    05/12/21 1254   BP: 128/78   BP Source: Arm, Left Upper   Pulse: 81   Temp: 36.7 ?C (98.1 ?F)   Resp: 16   SpO2: 97%   TempSrc: Oral   PainSc: Zero   Weight: 93 kg (205 lb 1.6 oz)   Height: 162.6 cm (5' 4)     Body mass index is 35.21 kg/m?Marland Kitchen     Physical Exam  Vitals and nursing note reviewed.   Constitutional:       Appearance: Normal appearance.   HENT:      Head: Normocephalic and atraumatic.      Mouth/Throat:      Comments: Mallampati level 3-4  Eyes:      Extraocular Movements: Extraocular movements intact.   Cardiovascular:      Rate and Rhythm: Normal rate.      Pulses: Normal pulses.   Pulmonary:      Effort: Pulmonary effort is normal. No respiratory distress.   Abdominal:      Palpations: Abdomen is soft.   Musculoskeletal:         General: No swelling. Normal range of motion.      Cervical back: Neck supple.   Skin:     General: Skin is dry.   Neurological:      Mental Status: Mental status is at baseline.   Psychiatric:         Mood and Affect: Mood normal.         Behavior: Behavior normal.              Assessment and Plan:    Problem   Bmi 35.0-35.9,Adult   Osa (Obstructive Sleep Apnea)    07/2016 Moderate-severe, Mob1 in REM sleep when OSAS most severe OSA (obstructive sleep apnea)  Patient has history of moderate obstructive sleep apnea.  Patient reportedly did not tolerate CPAP therapy.  Patient was evaluated by Dr. [name] for inspire placement she is scheduled for DICE in February 2023.  Patient qualifies for hypoglossal nerve stimulation implantation.  She meets the criteria since her AHI is 22.9/hr.  Her BMI is 35.21, and she failed CPAP therapy.  We discussed with patient if she qualifies for inspire implantation, we will follow-up with her for activation and further management.  In case if she did not qualify for inspire implantation, we will follow-up with her and help with her CPAP use.    BMI 35.0-35.9,adult  The benefits of weight loss in the setting of sleep apnea were discussed. Discussed with patient to continue weight loss as additional treatment for sleep disorder breathing as loss of 10% of body mass can lead to a reduction of AHI by 25-30%.                Garlon Hatchet, MD

## 2021-05-12 NOTE — Assessment & Plan Note
The benefits of weight loss in the setting of sleep apnea were discussed. Discussed with patient to continue weight loss as additional treatment for sleep disorder breathing as loss of 10% of body mass can lead to a reduction of AHI by 25-30%.

## 2021-05-23 ENCOUNTER — Encounter: Admit: 2021-05-23 | Discharge: 2021-05-23 | Payer: No Typology Code available for payment source

## 2021-05-23 MED ORDER — HYDROXYZINE HCL 10 MG PO TAB
ORAL_TABLET | Freq: Two times a day (BID) | 0 refills | PRN
Start: 2021-05-23 — End: ?

## 2021-05-23 MED ORDER — FLUOXETINE 40 MG PO CAP
ORAL_CAPSULE | Freq: Every day | 0 refills
Start: 2021-05-23 — End: ?

## 2021-05-23 MED ORDER — LAMOTRIGINE 25 MG PO TAB
ORAL_TABLET | Freq: Every day | 0 refills
Start: 2021-05-23 — End: ?

## 2021-05-29 ENCOUNTER — Ambulatory Visit: Admit: 2021-05-29 | Discharge: 2021-05-29 | Payer: No Typology Code available for payment source

## 2021-05-29 ENCOUNTER — Encounter: Admit: 2021-05-29 | Discharge: 2021-05-29 | Payer: No Typology Code available for payment source

## 2021-05-29 DIAGNOSIS — F32A Depressed: Secondary | ICD-10-CM

## 2021-05-29 DIAGNOSIS — R8290 Unspecified abnormal findings in urine: Secondary | ICD-10-CM

## 2021-05-29 DIAGNOSIS — F331 Major depressive disorder, recurrent, moderate: Secondary | ICD-10-CM

## 2021-05-29 DIAGNOSIS — F4312 Post-traumatic stress disorder, chronic: Secondary | ICD-10-CM

## 2021-05-29 DIAGNOSIS — F99 Mental disorder, not otherwise specified: Secondary | ICD-10-CM

## 2021-05-29 DIAGNOSIS — N200 Calculus of kidney: Secondary | ICD-10-CM

## 2021-05-29 DIAGNOSIS — E785 Hyperlipidemia, unspecified: Secondary | ICD-10-CM

## 2021-05-29 DIAGNOSIS — G4730 Sleep apnea, unspecified: Secondary | ICD-10-CM

## 2021-05-29 DIAGNOSIS — Q615 Medullary cystic kidney: Secondary | ICD-10-CM

## 2021-05-29 DIAGNOSIS — R000 Tachycardia, unspecified: Secondary | ICD-10-CM

## 2021-05-29 DIAGNOSIS — Z8659 Personal history of other mental and behavioral disorders: Secondary | ICD-10-CM

## 2021-05-29 DIAGNOSIS — F502 Bulimia nervosa: Secondary | ICD-10-CM

## 2021-05-29 DIAGNOSIS — J302 Other seasonal allergic rhinitis: Secondary | ICD-10-CM

## 2021-05-29 DIAGNOSIS — G4733 Obstructive sleep apnea (adult) (pediatric): Secondary | ICD-10-CM

## 2021-05-29 DIAGNOSIS — E7800 Pure hypercholesterolemia, unspecified: Secondary | ICD-10-CM

## 2021-05-29 DIAGNOSIS — I499 Cardiac arrhythmia, unspecified: Secondary | ICD-10-CM

## 2021-05-29 DIAGNOSIS — E039 Hypothyroidism, unspecified: Secondary | ICD-10-CM

## 2021-05-29 DIAGNOSIS — F4310 Post-traumatic stress disorder, unspecified: Secondary | ICD-10-CM

## 2021-05-29 DIAGNOSIS — N390 Urinary tract infection, site not specified: Secondary | ICD-10-CM

## 2021-05-29 DIAGNOSIS — K219 Gastro-esophageal reflux disease without esophagitis: Secondary | ICD-10-CM

## 2021-05-29 MED ORDER — LAMOTRIGINE 25 MG PO TAB
25 mg | ORAL_TABLET | Freq: Every day | ORAL | 0 refills | Status: AC
Start: 2021-05-29 — End: ?

## 2021-05-29 MED ORDER — FLUOXETINE 40 MG PO CAP
80 mg | ORAL_CAPSULE | Freq: Every day | ORAL | 0 refills | Status: AC
Start: 2021-05-29 — End: ?

## 2021-05-29 MED ORDER — CHLORTHALIDONE 25 MG PO TAB
12.5 mg | ORAL_TABLET | Freq: Every day | ORAL | 2 refills | Status: AC
Start: 2021-05-29 — End: ?

## 2021-05-29 MED ORDER — LAMOTRIGINE 100 MG PO TAB
100 mg | ORAL_TABLET | Freq: Every day | ORAL | 0 refills | Status: AC
Start: 2021-05-29 — End: ?

## 2021-05-29 MED ORDER — HYDROXYZINE HCL 10 MG PO TAB
10-20 mg | ORAL_TABLET | Freq: Every evening | ORAL | 0 refills | 30.00000 days | Status: AC | PRN
Start: 2021-05-29 — End: ?

## 2021-05-29 NOTE — Progress Notes
ATTENDING NOTE  I discussed Holly Maldonado with [name], DO and concur with the assessment and treatment plan. Patient is 61 y.o. female with MDD, PTSD and Bulimia Nervosa. Psychiatric symptoms well controlled at today's encounter. Denies SI/HI and AVH and no other safety concerns. Pt reports no medication side effects.    PLAN:  The following medications will be continued for the above symptoms:  1. Continue Prozac 80mg  PO Daily  2. Continue Hydroxyzine 10-20mg  PO QHS PRN  3. Increase Lamictal to 125mg  PO QHS  4. No labs needed    ? albuterol sulfate (PROAIR HFA) 90 mcg/actuation HFA aerosol inhaler Inhale two puffs by mouth into the lungs every 6 hours as needed. Shake well before use.   ? aspirin EC 81 mg tablet Take 81 mg by mouth daily. Every other day.   ? AUVI-Q 0.3 mg/0.3 mL auto-injector Inject 0.3 mg (1 Pen) into thigh if needed for anaphylactic reaction. May repeat in 5-15 minutes if needed.   ? benzonatate (TESSALON PERLES) 100 mg capsule Take one capsule by mouth every 8 hours.   ? chlorthalidone (HYGROTON) 25 mg tablet Take one-half tablet by mouth daily.   ? Cholecalciferol (Vitamin D3) 1,000 unit cap Take 5,000 Units by mouth.   ? docosahexaenoic acid/epa (FISH OIL PO) Take 3 capsules by mouth daily.   ? estradioL (VIVELLE-DOT) 0.075 mg/24 hr patch APPLY 1 PATCH TOPICALLY TWICE A WEEK   ? ezetimibe (ZETIA) 10 mg tablet Take one tablet by mouth daily.   ? flecainide (TAMBOCOR) 100 mg tablet Take one tablet by mouth twice daily. (Patient taking differently: Take 50 mg by mouth twice daily.)   ? flunisolide 25 mcg (0.025 %) nasal spray Apply one spray to each nostril as directed twice daily.   ? FLUoxetine (PROZAC) 40 mg capsule Take 2 capsules by mouth once daily   ? hydrOXYzine (ATARAX) 10 mg tablet Take 1 tablet by mouth twice daily as needed for anxiety   ? ketotifen (ZADITOR) 0.025 % (0.035 %) ophthalmic solution Place one drop into or around eye(s) twice daily as needed.   ? lamoTRIgine (LAMICTAL) 100 mg tablet Take one tablet by mouth daily.   ? levothyroxine (SYNTHROID) 100 mcg tablet Take one tablet by mouth daily 30 minutes before breakfast.   ? magnesium oxide (MAG-OX) 400 mg (241.3 mg magnesium) tablet Take 400 mg by mouth daily.   ? olopatadine (PATANASE) 0.6 % nasal spray Apply one spray to two sprays to each nostril as directed twice daily as needed.   ? omeprazole DR (PRILOSEC) 40 mg capsule Take one capsule by mouth daily before breakfast.   ? rosuvastatin (CRESTOR) 10 mg tablet Take one tablet by mouth daily.   ? zinc sulfate 220 mg (50 mg elemental zinc) capsule Take 220 mg by mouth daily.       Rae Mar, MD  05/29/2021
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
## 2021-05-29 NOTE — Patient Instructions
Continue psychotropic medications as prescribed unless changes listed below:   Increase lamotrigine     Return to clinic in 3 months.  Please call the clinic to reschedule or cancel appointments if somethings changes.     Our clinic is dedicated to making sure your prescriptions are filled in a timely manner during regular business hours (8am-5pm).  If you need a medication refill, please call your pharmacy to request a refill.  Please make sure to request refills at least 72 hours in advance of your last dose.  Your pharmacy will reach out to [name] electronically, which is the preferred method.  Please try to refrain from paging the on-call psychiatrist regarding medication refills (including controlled substances), as you may not receive a refill or a full refill at that time.      For nonemergent concerns, you may reach out to the clinic via [place]. You should receive a response within 72 business hours from our clinic staff/providers.    [facility] provides its patients with 24/7 care. If you are concerned about an impending psychiatric emergency (i.e. if you have any concerns about risk of harm to yourself, risk of harm to others or feel unsure about your ability to care for yourself), you may reach out to the overnight psychiatrist on-call by calling the main [facility] operator line outside of normal business hours ([phone]). If you are unsure if you are experiencing a psychiatric emergency, please ask the operator to page the on-call psychiatrist for clarification.     If you require an immediate response from a health care professional, please do not call the on-call psychiatrist and call 911 directly.     In the event of a safety concern or suicidal thoughts, call 911 or go to the nearest emergency room. [facility] [phone] (Talk). Crisis Text Hotline (text [phone]).     It was good seeing you!   Dr. Elvina Mattes, Psychiatry Resident

## 2021-05-29 NOTE — Patient Instructions
To help prevent recurrence of kidney stones, please follow the following instructions:    High fluid intake (> 3000 mL/ day; 3 liters/ day)  At least ten 10 oz glasses/ day  Sodium restriction  Avoid salty foods & using the salt shaker  2000 - 3000 mg sodium (Na)/ day  Moderate Calcium Intake  600 - 1100 calcium (Ca) mg/ day  Oxalate Restriction  Avoid nuts, spinach, chocolate, tea, potatoes, rhubarb, vitamin C supplements  Helpful resource:  www.ohf.org/docs/Oxalate2008.pdf  Avoid excessive animal protein intake  3 - 7 ounces/ day  Increase potassium-rich citrus product intake  Citric fruits & fruit juices (ex: oranges, lemons, limes, grapefuit)  Caution: grapefruit & grapefruit juice may interfere with the metabolism of many prescription medications, so discuss your medication list with you primary care provider prior to starting a grapefruit regimen.)

## 2021-06-08 ENCOUNTER — Encounter: Admit: 2021-06-08 | Discharge: 2021-06-08 | Payer: No Typology Code available for payment source

## 2021-06-08 DIAGNOSIS — K200 Eosinophilic esophagitis: Secondary | ICD-10-CM

## 2021-06-08 MED ORDER — OMEPRAZOLE 40 MG PO CPDR
ORAL_CAPSULE | Freq: Every day | 0 refills
Start: 2021-06-08 — End: ?

## 2021-06-08 MED ORDER — OMEPRAZOLE 40 MG PO CPDR
40 mg | ORAL_CAPSULE | Freq: Every day | ORAL | 5 refills | Status: AC
Start: 2021-06-08 — End: ?

## 2021-06-12 ENCOUNTER — Encounter: Admit: 2021-06-12 | Discharge: 2021-06-12 | Payer: No Typology Code available for payment source

## 2021-06-12 DIAGNOSIS — R000 Tachycardia, unspecified: Secondary | ICD-10-CM

## 2021-06-12 DIAGNOSIS — E785 Hyperlipidemia, unspecified: Secondary | ICD-10-CM

## 2021-06-12 DIAGNOSIS — Z8659 Personal history of other mental and behavioral disorders: Secondary | ICD-10-CM

## 2021-06-12 DIAGNOSIS — I499 Cardiac arrhythmia, unspecified: Secondary | ICD-10-CM

## 2021-06-12 DIAGNOSIS — N390 Urinary tract infection, site not specified: Secondary | ICD-10-CM

## 2021-06-12 DIAGNOSIS — F4310 Post-traumatic stress disorder, unspecified: Secondary | ICD-10-CM

## 2021-06-12 DIAGNOSIS — F99 Mental disorder, not otherwise specified: Secondary | ICD-10-CM

## 2021-06-12 DIAGNOSIS — F32A Depressed: Secondary | ICD-10-CM

## 2021-06-12 DIAGNOSIS — Q615 Medullary cystic kidney: Secondary | ICD-10-CM

## 2021-06-12 DIAGNOSIS — E7800 Pure hypercholesterolemia, unspecified: Secondary | ICD-10-CM

## 2021-06-12 DIAGNOSIS — E039 Hypothyroidism, unspecified: Secondary | ICD-10-CM

## 2021-06-12 DIAGNOSIS — J302 Other seasonal allergic rhinitis: Secondary | ICD-10-CM

## 2021-06-12 DIAGNOSIS — G4730 Sleep apnea, unspecified: Secondary | ICD-10-CM

## 2021-06-12 DIAGNOSIS — K219 Gastro-esophageal reflux disease without esophagitis: Secondary | ICD-10-CM

## 2021-06-12 DIAGNOSIS — G4733 Obstructive sleep apnea (adult) (pediatric): Secondary | ICD-10-CM

## 2021-06-15 ENCOUNTER — Encounter: Admit: 2021-06-15 | Discharge: 2021-06-15 | Payer: No Typology Code available for payment source

## 2021-06-15 ENCOUNTER — Ambulatory Visit: Admit: 2021-06-15 | Discharge: 2021-06-15 | Payer: No Typology Code available for payment source

## 2021-06-15 ENCOUNTER — Inpatient Hospital Stay: Admit: 2021-06-15 | Discharge: 2021-06-15 | Payer: No Typology Code available for payment source

## 2021-06-15 DIAGNOSIS — F32A Depressed: Secondary | ICD-10-CM

## 2021-06-15 DIAGNOSIS — E278 Other specified disorders of adrenal gland: Secondary | ICD-10-CM

## 2021-06-15 DIAGNOSIS — F4310 Post-traumatic stress disorder, unspecified: Secondary | ICD-10-CM

## 2021-06-15 DIAGNOSIS — E7800 Pure hypercholesterolemia, unspecified: Secondary | ICD-10-CM

## 2021-06-15 DIAGNOSIS — E785 Hyperlipidemia, unspecified: Secondary | ICD-10-CM

## 2021-06-15 DIAGNOSIS — Z8659 Personal history of other mental and behavioral disorders: Secondary | ICD-10-CM

## 2021-06-15 DIAGNOSIS — F99 Mental disorder, not otherwise specified: Secondary | ICD-10-CM

## 2021-06-15 DIAGNOSIS — K219 Gastro-esophageal reflux disease without esophagitis: Secondary | ICD-10-CM

## 2021-06-15 DIAGNOSIS — R000 Tachycardia, unspecified: Secondary | ICD-10-CM

## 2021-06-15 DIAGNOSIS — E039 Hypothyroidism, unspecified: Secondary | ICD-10-CM

## 2021-06-15 DIAGNOSIS — G4730 Sleep apnea, unspecified: Secondary | ICD-10-CM

## 2021-06-15 DIAGNOSIS — G4733 Obstructive sleep apnea (adult) (pediatric): Secondary | ICD-10-CM

## 2021-06-15 DIAGNOSIS — N390 Urinary tract infection, site not specified: Secondary | ICD-10-CM

## 2021-06-15 DIAGNOSIS — I499 Cardiac arrhythmia, unspecified: Secondary | ICD-10-CM

## 2021-06-15 DIAGNOSIS — J302 Other seasonal allergic rhinitis: Secondary | ICD-10-CM

## 2021-06-15 DIAGNOSIS — Q615 Medullary cystic kidney: Secondary | ICD-10-CM

## 2021-06-15 MED ORDER — SODIUM CHLORIDE 0.9 % IJ SOLN
25 mL | Freq: Once | INTRAVENOUS | 0 refills | Status: CP
Start: 2021-06-15 — End: ?
  Administered 2021-06-15: 20:00:00 25 mL via INTRAVENOUS

## 2021-06-15 MED ORDER — GADOBENATE DIMEGLUMINE 529 MG/ML (0.1MMOL/0.2ML) IV SOLN
18 mL | Freq: Once | INTRAVENOUS | 0 refills | Status: CP
Start: 2021-06-15 — End: ?
  Administered 2021-06-15: 20:00:00 18 mL via INTRAVENOUS

## 2021-06-18 ENCOUNTER — Ambulatory Visit: Admit: 2021-06-18 | Discharge: 2021-06-18 | Payer: No Typology Code available for payment source

## 2021-06-18 ENCOUNTER — Encounter: Admit: 2021-06-18 | Discharge: 2021-06-18 | Payer: No Typology Code available for payment source

## 2021-06-18 DIAGNOSIS — E785 Hyperlipidemia, unspecified: Secondary | ICD-10-CM

## 2021-06-18 DIAGNOSIS — G4733 Obstructive sleep apnea (adult) (pediatric): Secondary | ICD-10-CM

## 2021-06-18 DIAGNOSIS — R000 Tachycardia, unspecified: Secondary | ICD-10-CM

## 2021-06-18 DIAGNOSIS — Z8659 Personal history of other mental and behavioral disorders: Secondary | ICD-10-CM

## 2021-06-18 DIAGNOSIS — F4310 Post-traumatic stress disorder, unspecified: Secondary | ICD-10-CM

## 2021-06-18 DIAGNOSIS — Q615 Medullary cystic kidney: Secondary | ICD-10-CM

## 2021-06-18 DIAGNOSIS — I499 Cardiac arrhythmia, unspecified: Secondary | ICD-10-CM

## 2021-06-18 DIAGNOSIS — J302 Other seasonal allergic rhinitis: Secondary | ICD-10-CM

## 2021-06-18 DIAGNOSIS — F99 Mental disorder, not otherwise specified: Secondary | ICD-10-CM

## 2021-06-18 DIAGNOSIS — N390 Urinary tract infection, site not specified: Secondary | ICD-10-CM

## 2021-06-18 DIAGNOSIS — K219 Gastro-esophageal reflux disease without esophagitis: Secondary | ICD-10-CM

## 2021-06-18 DIAGNOSIS — F32A Depressed: Secondary | ICD-10-CM

## 2021-06-18 DIAGNOSIS — E278 Other specified disorders of adrenal gland: Secondary | ICD-10-CM

## 2021-06-18 DIAGNOSIS — E7800 Pure hypercholesterolemia, unspecified: Secondary | ICD-10-CM

## 2021-06-18 DIAGNOSIS — G4730 Sleep apnea, unspecified: Secondary | ICD-10-CM

## 2021-06-18 DIAGNOSIS — E039 Hypothyroidism, unspecified: Secondary | ICD-10-CM

## 2021-06-18 DIAGNOSIS — Z01818 Encounter for other preprocedural examination: Secondary | ICD-10-CM

## 2021-06-18 LAB — BASIC METABOLIC PANEL
ANION GAP: 9 pg (ref 3–12)
BLD UREA NITROGEN: 20 mg/dL (ref 7–25)
CALCIUM: 9.8 mg/dL (ref 8.5–10.6)
CHLORIDE: 101 MMOL/L (ref 98–110)
CO2: 28 MMOL/L (ref 21–30)
CREATININE: 0.8 mg/dL (ref 0.4–1.00)
EGFR: >60 mL/min (ref >60)
GLUCOSE,PANEL: 103 mg/dL — ABNORMAL HIGH (ref 70–100)
POTASSIUM: 3.6 MMOL/L (ref 3.5–5.1)

## 2021-06-18 LAB — CBC
RBC COUNT: 4.6 M/UL (ref 4.0–5.0)
WBC COUNT: 5.1 K/UL (ref 4.5–11.0)

## 2021-06-18 MED ORDER — LIDOCAINE (PF) 20 MG/ML (2 %) IJ SOLN
INTRAVENOUS | 0 refills | Status: DC
Start: 2021-06-18 — End: 2021-06-18

## 2021-06-18 MED ORDER — PROPOFOL INJ 10 MG/ML IV VIAL
INTRAVENOUS | 0 refills | Status: DC
Start: 2021-06-18 — End: 2021-06-18

## 2021-06-18 NOTE — Anesthesia Post-Procedure Evaluation
Post-Anesthesia Evaluation    Name: Holly Maldonado      MRN: [id number]     DOB: 1960/10/28     Age: 61 y.o.     Sex: female   __________________________________________________________________________     Procedure Information     Anesthesia Start Date/Time: 06/18/21 1408    Procedure: DRUG-INDUCED SLEEP ENDOSCOPY WITH DYNAMIC EVALUATION OF VELUM/ PHARYNX/ TONGUE BASE/ AND LARYNX FOR EVALUATION OF SLEEP-DISORDERED BREATHING -FLEXIBLE -DIAGNOSTIC    Location: [facility] / [place] OR    Surgeons: Arta Silence, MD          Post-Anesthesia Vitals  BP: 139/85 (02/09 1435)  Temp: 36.7 C (98.1 F) (02/09 1421)  Pulse: 82 (02/09 1435)  Respirations: 14 PER MINUTE (02/09 1435)  SpO2: 96 % (02/09 1435)  SpO2 Pulse: 82 (02/09 1435)  O2 Device: None (Room air) (02/09 1435)  Height: 162.6 cm (5\' 4" ) (02/09 1354)   Vitals Value Taken Time   BP 139/85 06/18/21 1435   Temp 36.7 C (98.1 F) 06/18/21 1421   Pulse 82 06/18/21 1435   Respirations 14 PER MINUTE 06/18/21 1435   SpO2 96 % 06/18/21 1435   O2 Device None (Room air) 06/18/21 1435   ABP     ART BP           Post Anesthesia Evaluation Note    Evaluation location: Pre/Post  Patient participation: recovered; patient participated in evaluation  Level of consciousness: alert    Pain score: 0  Pain management: adequate    Hydration: normovolemia  Temperature: 36.0C - 38.4C  Airway patency: adequate    Perioperative Events       Post-op nausea and vomiting: no PONV    Postoperative Status  Cardiovascular status: hemodynamically stable  Respiratory status: spontaneous ventilation        Perioperative Events  There were no known notable events for this encounter.

## 2021-06-18 NOTE — Pre-Anesthesia Patient Instructions
GENERAL INFORMATION    Before you come to the hospital  If you are having an outpatient procedure, you will need to arrange for a responsible ride/person to accompany you home due to sedation or anesthesia with your procedure. A responsible person is a person who has the ability to identify a change in the patient's status and notify medical personnel.  This is typically a family member or friend.  Public transportation is permitted if you have a responsible person to accompany you.  An [employer], taxi or other public transportation driver is not considered a responsible person to accompany you home.  Bath/Shower Instructions  Take a bath or shower using the special soap given to you in PAC. Use half the bottle the night before, and the other half the morning of your procedure. Use clean towels with each bath or shower.  Put on clean clothes after bath or shower.  Avoid using lotion and oils.  If you are having surgery above the waist, wear a shirt that fastens up the front.  Sleep on clean sheets if bath or shower is done the night before procedure.  Leave money, credit cards, jewelry, and any other valuables at home. [facility] is not responsible for the loss or breakage of personal items.  Remove nail polish, makeup and all jewelry (including piercings) before coming to the hospital.  The morning of your procedure:  brush your teeth and tongue  do not smoke, vape, chew or use any tobacco products  do not shave the area where you will have surgery    What to bring to the hospital  ID/ Insurance Card  Medical Device card  Official documents for legal guardianship   Copy of your Living Will, Advanced Directives, and/or Durable Power of Attorney.  If you have these documents, please bring them to the admissions office on the day of your surgery to be scanned into your records.  Small bag with a few personal belongings  CPAP/BiPAP machine (including all supplies)  Walker, cane, or motorized scooter  Cases for glasses/hearing aids/contact lens (bring solutions for contacts)  Dress in clean, loose, comfortable clothing     Preparing to get your medications at discharge  Your surgeon may prescribe you medications to take after your procedure.  If you would like the convenience of having your medications filled here at [facility] please do one of the following:  Go to [facility] pharmacy after your [facility] appointment to put a credit card on file.  Call [facility] pharmacy at [phone] (Monday-Friday 7am-9pm or Saturday and Sunday 9am-5pm) to put a credit card on file.  Bring a credit card or cash on the day of your procedure- please leave with a family member rather than bringing it into the preop area.     Eating or drinking before surgery  Do not eat or drink anything after 11:00 p.m. the day before your procedure (including gum, mints, candy, or chewing tobacco) OR follow the specific instructions you were given by your Surgeon.  You may have WATER ONLY up to 2 hours before arriving at the hospital.     Other instructions  Notify your surgeon if:  you become ill with a cough, fever, sore throat, nausea, vomiting or flu-like symptoms  you have any open wounds/sores that are red, painful, draining, or are new since you last saw the doctor  you need to cancel your procedure    On the day of your procedure, notify us at [facility]: [phone]  if you need to cancel your procedure  if you are going to be late    Arrival at the hospital  [facility]  [address]  [place], [place] [address]    Park in the [employer], located directly across from the main entrance to the hospital.  Enter through the ground floor main hospital entrance and check in at the Information Desk in the lobby.  They will validate your parking ticket and direct you to the next location.    You will receive a call with your surgery arrival time between 2:30pm and 4:30pm the last business day before your procedure.  If you do not receive a call, please call [phone] before 4:30pm or [phone] after 4:30pm.  Phone carriers that use spam blockers will sometimes block our phone numbers. If your phone contact number is a mobile phone, please adjust your settings to make sure you receive our call.  In your phone settings, turn OFF the setting ?silence unknown callers.?  Please add these phone numbers to your contacts [phone] & [phone]           For the safety of all patients, visitors and staff as we work to contain COVID-19, we must restrict patient visitors.    Current Visitor Policy (12/08/20):    Our current, and ongoing, visitor rules in surgery and procedural areas are:    2 visitors per patient will be allowed to accompany the patient and wait in the Waiting Room.     Patients in inpatient and pediatric units, Emergency Department, ambulatory clinics and lab appointments may only have two visitors.       For inpatient stays, patients may have 2 visitors at a time at their bedside. The two visitors can change throughout the day, but no more than two at a time may be bedside.  The policy applies to [facility]s [place], [address], [place] and [place] campuses and clinics.    Exceptions include:  No visitors allowed for patients with active COVID-19 infections.  Children younger than age 79 are allowed to visit inpatients.  Two parents/guardians are allowed for surgical or procedural patients younger than 61 years old.  Adult inpatients in semiprivate rooms may have visitors, but visits should be coordinated so only two total visitors are in a room at a time due to space limitations.    Visitors must be free of fever and symptoms to be in our facilities. We ask visitors to follow these guidelines:  Wear a mask at all times, unless under the age of 2, have trouble breathing or are unconscious, incapacitated or otherwise unable to remove the cover without assistance.  Go directly to the nursing station in the unit you are visiting and do not linger in public areas.  Check in at the nursing station before going to the patient's room.  Maintain a physical distance of six feet from all others.  Follow elevator restrictions to four riding at a time - peak times are 6:30-7:30 a.m., noon and 6:30-7:30 p.m.  Be aware cafeteria peak times are 11 a.m. - 1 p.m.  Wash your hands frequently and cover your coughs and sneezes.    Coronavirus (COVID19) Information  If you get sick with fever (100.71F/38C or higher), cough, or have trouble breathing:  Call your primary care physician for questions or health needs.  Notify your surgeon if you are COVID+ positive.  For up to date information on the Coronavirus, visit the CDC website at http://trevino.com/.

## 2021-06-18 NOTE — Anesthesia Pre-Procedure Evaluation
MOAnesthesia Pre-Procedure Evaluation    Name: Holly Maldonado      MRN: [id number]     DOB: [birth year]     Age: 61 y.o.     Sex: female   _________________________________________________________________________     Procedure Info:   Procedure Information     Anesthesia Start Date/Time: 06/18/21 1408    Procedure: DRUG-INDUCED SLEEP ENDOSCOPY WITH DYNAMIC EVALUATION OF VELUM/ PHARYNX/ TONGUE BASE/ AND LARYNX FOR EVALUATION OF SLEEP-DISORDERED BREATHING -?FLEXIBLE -DIAGNOSTIC    Location: [facility] / [id number]    Surgeons: Lurline Idol, MD        Physical Assessment  Vital Signs (last filed in past 24 hours):  BP: 139/85 (02/09 1435)  Temp: 36.7 ?C (98.1 ?F) (02/09 1421)  Pulse: 82 (02/09 1435)  Respirations: 14 PER MINUTE (02/09 1435)  SpO2: 96 % (02/09 1435)  O2 Device: None (Room air) (02/09 1435)  Height: 162.6 cm (5' 4) (02/09 1354)  Weight: 88 kg (194 lb 0.1 oz) (02/09 1354)  Admission / Dosing Weight: 89.6 kg (197 lb 9.6 oz) (02/09 0900)  SpO2 Pulse: 82 (02/09 1435)      Patient History   Allergies   Allergen Reactions   ? Buspirone ANXIETY   ? Fetzima [Levomilnacipran] SEE COMMENTS     Per pt very depressed, couldn't stop crying.   ? Ipratropium SEE COMMENTS     Blurry vision with ipratropium nasal spray   ? Lurasidone SEE COMMENTS        Current Medications    Medication Directions   albuterol sulfate (PROAIR HFA) 90 mcg/actuation HFA aerosol inhaler Inhale two puffs by mouth into the lungs every 6 hours as needed. Shake well before use.   aspirin EC 81 mg tablet Take 81 mg by mouth at bedtime daily.   chlorthalidone (HYGROTON) 25 mg tablet Take one-half tablet by mouth daily.   docosahexaenoic acid/epa (FISH OIL PO) Take 3 capsules by mouth daily.   estradioL (VIVELLE-DOT) 0.075 mg/24 hr patch APPLY 1 PATCH TOPICALLY TWICE A WEEK   ezetimibe (ZETIA) 10 mg tablet Take one tablet by mouth daily.  Patient taking differently: Take 10 mg by mouth at bedtime daily.   fexofenadine HCl (ALLEGRA PO) Take 1 tablet by mouth twice daily.   flecainide (TAMBOCOR) 100 mg tablet Take one tablet by mouth twice daily.  Patient taking differently: Take 50 mg by mouth twice daily.   flunisolide 25 mcg (0.025 %) nasal spray Apply one spray to each nostril as directed twice daily.   FLUoxetine (PROZAC) 40 mg capsule Take two capsules by mouth daily.   hydrOXYzine (ATARAX) 10 mg tablet Take one tablet to two tablets by mouth at bedtime as needed for Anxiety.   ketotifen (ZADITOR) 0.025 % (0.035 %) ophthalmic solution Place one drop into or around eye(s) twice daily as needed.   lamoTRIgine (LAMICTAL) 100 mg tablet Take one tablet by mouth daily. Take in addition to lamotrigine 25mg  daily for total daily dose of lamotrigine 125mg  daily.   lamoTRIgine (LAMICTAL) 25 mg tablet Take one tablet by mouth daily. Take in addition to lamotrigine 100mg  daily for total daily dose of lamotrigine 125mg  daily.   levothyroxine (SYNTHROID) 100 mcg tablet Take one tablet by mouth daily 30 minutes before breakfast.   magnesium oxide (MAG-OX) 400 mg (241.3 mg magnesium) tablet Take 1,200 mg by mouth at bedtime daily. 1,200 mg = 3 tablets   omeprazole DR (PRILOSEC) 40 mg capsule Take one capsule by mouth  daily before breakfast.   other medication CITRUS BERGAMOT supplement. 1 tablet in the morning  Indications: for cholesterol   rosuvastatin (CRESTOR) 10 mg tablet Take one tablet by mouth daily.  Patient taking differently: Take 10 mg by mouth at bedtime daily.   TURMERIC PO Take 3 tablets by mouth daily.   zinc sulfate 220 mg (50 mg elemental zinc) capsule Take 220 mg by mouth daily.         Review of Systems/Medical History        PONV Screening: Non-smoker  No history of anesthetic complications  Family history of anesthetic complications ( mother had unknown reaction 40 yrs ago)      Airway - negative        Pulmonary       Not a current smoker (quit in 1994. 7 PYH)        Asthma (rare use of rescue inhaler with last use in Nov 2022) well controlled      No recent URI        Obstructive Sleep Apnea (Can not tolerate CPAP. Tested for Inspire on 06/18/21); noncompliant      Cardiovascular         Exercise tolerance: >4 METS (METS >9.25 per [facility])      Beta Blocker therapy: No      Beta blockers within 24 hours: n/a        Hypertension, well controlled      CAD: calcium score 0.      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        Electrolyte problem: medullary sponge kidney disease. seen by nephrology.      Neuro/Psych       No seizures      No CVA      Headaches (rare with tiniitus)        Psychiatric history (MDD, PTSD and Hx of Bulimia Nervosa )          Depression          ADHD          Anxiety      Musculoskeletal         Neck pain      Tmj      Endocrine/Other         Hypothyroidism (taking Synthroid)      History of blood transfusion (at age 64 after tonsilectomy)      Adrenal insufficiency: Adrenal mass on left side.      Obesity        Pituitary adenoma - stable    Constitution - negative   Physical Exam    Airway Findings      Mallampati: II      TM distance: >3 FB      Neck ROM: full      Mouth opening: good    Dental Findings: Negative      Cardiovascular Findings:       Rhythm: regular      Rate: normal      No murmur, no carotid bruit, no peripheral edema    Pulmonary Findings:       Breath sounds clear to auscultation. No decreased breath sounds.    Neurological  Findings:       Alert and oriented x 3    Normal mental status    Constitutional findings: Negative      No acute distress      Well-developed      Well-nourished       Diagnostic Tests      Hematology:   Lab Results   Component Value Date    HGB 13.6 06/18/2021    HCT 40.8 06/18/2021    PLTCT 255 06/18/2021    WBC 5.1 06/18/2021    NEUT 54 02/08/2017    ANC 2.30 02/08/2017    ALC 1.30 02/08/2017    MONA 10 02/08/2017 AMC 0.40 02/08/2017    EOSA 3 02/08/2017    ABC 0.10 02/08/2017    MCV 88.0 06/18/2021    MCH 29.3 06/18/2021    MCHC 33.3 06/18/2021    MPV 7.8 06/18/2021    RDW 13.9 06/18/2021         General Chemistry:   Lab Results   Component Value Date    NA 138 06/18/2021    K 3.6 06/18/2021    CL 101 06/18/2021    CO2 28 06/18/2021    GAP 9 06/18/2021    BUN 20 06/18/2021    CR 0.85 06/18/2021    GLU 103 06/18/2021    GLU 93 09/25/2019    CA 9.8 06/18/2021    ALBUMIN 4.4 04/30/2020    OBSCA 5.5 03/17/2010    MG 1.9 09/12/2020    TOTBILI 0.4 04/30/2020    PO4 4.0 03/17/2010      Coagulation:   Lab Results   Component Value Date    PTT 30.4 02/08/2017    INR 1.0 02/08/2017     Stress echo 09/14/19:  Baseline Echocardiogram:  LVEF=55%.  Normal left ventricular size and systolic function.  Exercise Stress ECG Summary:  Baseline ECG shows sinus rhythm.  No symptoms suggestive of angina.  No new diagnostic ST-segment changes during exercise or recovery.  No sustained ectopy or arrhythmias.  Good exercise capacity.  Patient exercised for 8 minutes & 11 seconds achieving 90% of maximum predicted heart rate for age & gender.    Duke Treadmill Score is 8.  Exercise Stress Echocardiogram Summary:  No new regional wall motion abnormalities.  Global left ventricular systolic function becomes hyperdynamic.  Left ventricular ejection fraction increases and left ventricular volume decreases.  Conclusion:   Low risk Duke Treadmill Score.   Negative exercise stress echo without evidence of inducible ischemia.  Low risk for cardiovascular complications.    EKG 08/14/19  sinus rhythm multiple PVC; left bundle inferior axis consistent with RVOT origin    Patient's Revised Cardiac Risk Index (RCRI) score: 0.9% risk of complications including but not limited to MI, pulmonary edema, VF, cardiac arrest, complete heart block, death.      Anesthesia Plan    ASA score: 3         Informed Consent  Use of blood products discussed with patient        Lab: CBC, BMP, T&S reviewed with WNL. DOS T&C  AVW:[id number]  Consult:None

## 2021-06-19 ENCOUNTER — Encounter: Admit: 2021-06-19 | Discharge: 2021-06-19 | Payer: No Typology Code available for payment source

## 2021-06-19 NOTE — Telephone Encounter
Information regarding surgery, post op instructions and location of surgery with maps mailed to pt

## 2021-06-22 ENCOUNTER — Encounter: Admit: 2021-06-22 | Discharge: 2021-06-22 | Payer: No Typology Code available for payment source

## 2021-06-24 ENCOUNTER — Encounter: Admit: 2021-06-24 | Discharge: 2021-06-24 | Payer: No Typology Code available for payment source

## 2021-06-26 ENCOUNTER — Encounter: Admit: 2021-06-26 | Discharge: 2021-06-26 | Payer: No Typology Code available for payment source

## 2021-06-26 MED ORDER — LAMOTRIGINE 25 MG PO TAB
ORAL_TABLET | 0 refills
Start: 2021-06-26 — End: ?

## 2021-06-29 ENCOUNTER — Encounter: Admit: 2021-06-29 | Discharge: 2021-06-29 | Payer: No Typology Code available for payment source

## 2021-07-01 ENCOUNTER — Encounter: Admit: 2021-07-01 | Discharge: 2021-07-01 | Payer: No Typology Code available for payment source

## 2021-07-01 MED ORDER — FLECAINIDE 100 MG PO TAB
100 mg | ORAL_TABLET | Freq: Two times a day (BID) | ORAL | 3 refills | 30.00000 days | Status: AC
Start: 2021-07-01 — End: ?

## 2021-07-01 MED ORDER — ROSUVASTATIN 10 MG PO TAB
10 mg | ORAL_TABLET | Freq: Every day | ORAL | 3 refills | 90.00000 days | Status: AC
Start: 2021-07-01 — End: ?

## 2021-07-01 MED ORDER — LEVOTHYROXINE 100 MCG PO TAB
100 ug | ORAL_TABLET | Freq: Every day | ORAL | 0 refills | 30.00000 days | Status: AC
Start: 2021-07-01 — End: ?

## 2021-07-01 MED ORDER — EZETIMIBE 10 MG PO TAB
10 mg | ORAL_TABLET | Freq: Every day | ORAL | 3 refills | Status: AC
Start: 2021-07-01 — End: ?

## 2021-07-05 ENCOUNTER — Encounter: Admit: 2021-07-05 | Discharge: 2021-07-05 | Payer: No Typology Code available for payment source

## 2021-07-06 ENCOUNTER — Encounter: Admit: 2021-07-06 | Discharge: 2021-07-06 | Payer: No Typology Code available for payment source

## 2021-07-06 DIAGNOSIS — I499 Cardiac arrhythmia, unspecified: Secondary | ICD-10-CM

## 2021-07-06 DIAGNOSIS — R000 Tachycardia, unspecified: Secondary | ICD-10-CM

## 2021-07-06 DIAGNOSIS — N390 Urinary tract infection, site not specified: Secondary | ICD-10-CM

## 2021-07-06 DIAGNOSIS — F4310 Post-traumatic stress disorder, unspecified: Secondary | ICD-10-CM

## 2021-07-06 DIAGNOSIS — D352 Benign neoplasm of pituitary gland: Secondary | ICD-10-CM

## 2021-07-06 DIAGNOSIS — J302 Other seasonal allergic rhinitis: Secondary | ICD-10-CM

## 2021-07-06 DIAGNOSIS — E7800 Pure hypercholesterolemia, unspecified: Secondary | ICD-10-CM

## 2021-07-06 DIAGNOSIS — K219 Gastro-esophageal reflux disease without esophagitis: Secondary | ICD-10-CM

## 2021-07-06 DIAGNOSIS — F32A Depressed: Secondary | ICD-10-CM

## 2021-07-06 DIAGNOSIS — E785 Hyperlipidemia, unspecified: Secondary | ICD-10-CM

## 2021-07-06 DIAGNOSIS — G4733 Obstructive sleep apnea (adult) (pediatric): Secondary | ICD-10-CM

## 2021-07-06 DIAGNOSIS — F99 Mental disorder, not otherwise specified: Secondary | ICD-10-CM

## 2021-07-06 DIAGNOSIS — Q615 Medullary cystic kidney: Secondary | ICD-10-CM

## 2021-07-06 DIAGNOSIS — E039 Hypothyroidism, unspecified: Secondary | ICD-10-CM

## 2021-07-06 DIAGNOSIS — Z8659 Personal history of other mental and behavioral disorders: Secondary | ICD-10-CM

## 2021-07-06 DIAGNOSIS — G4730 Sleep apnea, unspecified: Secondary | ICD-10-CM

## 2021-07-06 DIAGNOSIS — E278 Other specified disorders of adrenal gland: Secondary | ICD-10-CM

## 2021-07-07 ENCOUNTER — Encounter: Admit: 2021-07-07 | Discharge: 2021-07-07 | Payer: No Typology Code available for payment source

## 2021-07-07 DIAGNOSIS — R7989 Other specified abnormal findings of blood chemistry: Secondary | ICD-10-CM

## 2021-07-07 DIAGNOSIS — Z79890 Hormone replacement therapy (postmenopausal): Secondary | ICD-10-CM

## 2021-07-07 MED ORDER — ESTRADIOL 0.075 MG/24 HR TD PTSW
1 | MEDICATED_PATCH | TRANSDERMAL | 0 refills | 30.00000 days | Status: AC
Start: 2021-07-07 — End: ?

## 2021-07-07 NOTE — Telephone Encounter
Request received from [facility] for refill of vivelle dot. Sent mychart message to pt reminding her to make annual well woman exam before further refills. Sent month's supply to pharmacy.

## 2021-07-08 ENCOUNTER — Ambulatory Visit: Admit: 2021-07-08 | Discharge: 2021-07-08 | Payer: No Typology Code available for payment source

## 2021-07-08 ENCOUNTER — Encounter: Admit: 2021-07-08 | Discharge: 2021-07-08 | Payer: No Typology Code available for payment source

## 2021-07-08 DIAGNOSIS — Z8659 Personal history of other mental and behavioral disorders: Secondary | ICD-10-CM

## 2021-07-08 DIAGNOSIS — N390 Urinary tract infection, site not specified: Secondary | ICD-10-CM

## 2021-07-08 DIAGNOSIS — D352 Benign neoplasm of pituitary gland: Secondary | ICD-10-CM

## 2021-07-08 DIAGNOSIS — G4733 Obstructive sleep apnea (adult) (pediatric): Secondary | ICD-10-CM

## 2021-07-08 DIAGNOSIS — Z01419 Encounter for gynecological examination (general) (routine) without abnormal findings: Secondary | ICD-10-CM

## 2021-07-08 DIAGNOSIS — F4310 Post-traumatic stress disorder, unspecified: Secondary | ICD-10-CM

## 2021-07-08 DIAGNOSIS — E039 Hypothyroidism, unspecified: Secondary | ICD-10-CM

## 2021-07-08 DIAGNOSIS — F99 Mental disorder, not otherwise specified: Secondary | ICD-10-CM

## 2021-07-08 DIAGNOSIS — Z1239 Encounter for other screening for malignant neoplasm of breast: Secondary | ICD-10-CM

## 2021-07-08 DIAGNOSIS — F32A Depressed: Secondary | ICD-10-CM

## 2021-07-08 DIAGNOSIS — J302 Other seasonal allergic rhinitis: Secondary | ICD-10-CM

## 2021-07-08 DIAGNOSIS — E278 Other specified disorders of adrenal gland: Secondary | ICD-10-CM

## 2021-07-08 DIAGNOSIS — Q615 Medullary cystic kidney: Secondary | ICD-10-CM

## 2021-07-08 DIAGNOSIS — N200 Calculus of kidney: Secondary | ICD-10-CM

## 2021-07-08 DIAGNOSIS — Z79890 Hormone replacement therapy (postmenopausal): Secondary | ICD-10-CM

## 2021-07-08 DIAGNOSIS — G4730 Sleep apnea, unspecified: Secondary | ICD-10-CM

## 2021-07-08 DIAGNOSIS — I499 Cardiac arrhythmia, unspecified: Secondary | ICD-10-CM

## 2021-07-08 DIAGNOSIS — K219 Gastro-esophageal reflux disease without esophagitis: Secondary | ICD-10-CM

## 2021-07-08 DIAGNOSIS — E7800 Pure hypercholesterolemia, unspecified: Secondary | ICD-10-CM

## 2021-07-08 DIAGNOSIS — E785 Hyperlipidemia, unspecified: Secondary | ICD-10-CM

## 2021-07-08 DIAGNOSIS — R7989 Other specified abnormal findings of blood chemistry: Secondary | ICD-10-CM

## 2021-07-08 DIAGNOSIS — R000 Tachycardia, unspecified: Secondary | ICD-10-CM

## 2021-07-08 LAB — LIVER FUNCTION PANEL
ALBUMIN: 4.5 g/dL (ref 3.5–5.0)
ALK PHOSPHATASE: 97 U/L (ref 25–110)
ALT: 16 U/L (ref 7–56)
AST: 26 U/L (ref 7–40)
DIRECT BILIRUBIN: 0.1 mg/dL (ref <0.4)
TOTAL BILIRUBIN: 0.5 mg/dL (ref 0.3–1.2)
TOTAL PROTEIN: 7.3 g/dL (ref 6.0–8.0)

## 2021-07-08 LAB — BASIC METABOLIC PANEL
EGFR: >60 mL/min (ref >60)
POTASSIUM: 4.1 MMOL/L (ref 3.5–5.1)
SODIUM: 136 MMOL/L — ABNORMAL LOW (ref 137–147)

## 2021-07-08 LAB — TSH WITH FREE T4 REFLEX: TSH: 0.2 uU/mL — ABNORMAL LOW (ref 0.35–5.00)

## 2021-07-08 LAB — FREE T4-FREE THYROXINE: FREE T4: 0.8 ng/dL (ref 0.6–1.6)
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
## 2021-07-13 ENCOUNTER — Encounter: Admit: 2021-07-13 | Discharge: 2021-07-13 | Payer: No Typology Code available for payment source

## 2021-07-13 ENCOUNTER — Ambulatory Visit: Admit: 2021-07-13 | Discharge: 2021-07-13 | Payer: No Typology Code available for payment source

## 2021-07-13 DIAGNOSIS — Z6833 Body mass index (BMI) 33.0-33.9, adult: Secondary | ICD-10-CM

## 2021-07-13 DIAGNOSIS — F4310 Post-traumatic stress disorder, unspecified: Secondary | ICD-10-CM

## 2021-07-13 DIAGNOSIS — J302 Other seasonal allergic rhinitis: Secondary | ICD-10-CM

## 2021-07-13 DIAGNOSIS — Q615 Medullary cystic kidney: Secondary | ICD-10-CM

## 2021-07-13 DIAGNOSIS — G4730 Sleep apnea, unspecified: Secondary | ICD-10-CM

## 2021-07-13 DIAGNOSIS — K219 Gastro-esophageal reflux disease without esophagitis: Secondary | ICD-10-CM

## 2021-07-13 DIAGNOSIS — Z8659 Personal history of other mental and behavioral disorders: Secondary | ICD-10-CM

## 2021-07-13 DIAGNOSIS — E7800 Pure hypercholesterolemia, unspecified: Secondary | ICD-10-CM

## 2021-07-13 DIAGNOSIS — E278 Other specified disorders of adrenal gland: Secondary | ICD-10-CM

## 2021-07-13 DIAGNOSIS — R000 Tachycardia, unspecified: Secondary | ICD-10-CM

## 2021-07-13 DIAGNOSIS — F32A Depressed: Secondary | ICD-10-CM

## 2021-07-13 DIAGNOSIS — G4733 Obstructive sleep apnea (adult) (pediatric): Secondary | ICD-10-CM

## 2021-07-13 DIAGNOSIS — E785 Hyperlipidemia, unspecified: Secondary | ICD-10-CM

## 2021-07-13 DIAGNOSIS — N390 Urinary tract infection, site not specified: Secondary | ICD-10-CM

## 2021-07-13 DIAGNOSIS — I499 Cardiac arrhythmia, unspecified: Secondary | ICD-10-CM

## 2021-07-13 DIAGNOSIS — Z789 Other specified health status: Secondary | ICD-10-CM

## 2021-07-13 DIAGNOSIS — D352 Benign neoplasm of pituitary gland: Secondary | ICD-10-CM

## 2021-07-13 DIAGNOSIS — E039 Hypothyroidism, unspecified: Secondary | ICD-10-CM

## 2021-07-13 DIAGNOSIS — F99 Mental disorder, not otherwise specified: Secondary | ICD-10-CM

## 2021-07-13 NOTE — Progress Notes
Date of Service: 07/13/2021    Subjective:             Holly Maldonado is a 61 y.o. female.    History of Present Illness     61 year old female with history of moderate obstructive sleep apnea who presents for discussion of hypoglossal nerve stimulator surgery.  Patient was initially referred by Dr. Andreas Newport.  Drug-induced sleep endoscopy performed in February showed:  V:  Complete AP, no lateral  O:  No obstruction  T:   Complete AP, no lateral  E:  Partial AP    Patient had a at home sleep study performed December 2022 which showed an AHI of 22.9.  Current BMI is 33.9.  She has lost 10 pounds since last clinic visit and continues to lose weight. She has tried CPAP but is unable to tolerate her CPAP mask.  Her biggest problem with CPAP is fight the mask and machine all night. She reports frequent air leak and often unknowingly taking the mask off while sleeping. She pulls it off while she's sleeping and wears it for < 4 hrs a night. She best tolerated nasal pillow (history PTSD and maxillary fracture), but had air leak and couldn't sleep with chin strap or mouth taped shut.     Does not shoot shotguns. No history of prior neck or chest surgery.   Is having surgery for L adrenal gland removal at the end of March and was told to stop her asa81 5 days prior to surgery  PMH: takes Flecainide for PVCs & preventative asa81. No hx diabetes.   Past surgical hx: She had such severe scarring after tonsillectomy (age 45) that some doctors think she still has her tonsils.  She also reports near dying due to bleeding at that time and needed a blood transfusion. Has had numerous other surgeries since then - C-section, sinus surgery, laparoscopic endometrial surgery since then without any bleeding issues.          Medical History:   Diagnosis Date   ? Adrenal nodule (HCC)     Left   ? Arrhythmia     multiple PVCs from EKG 08/14/12   ? depression    ? Dyslipidemia    ? Endometriosis    ? Esophageal reflux    ? H/O eating disorder    ? Hematuria     microscopic   ? High cholesterol    ? Hypothyroid    ? Medullary sponge kidney    ? Mental disorder    ? Nephrocalcinosis    ? Obstructive sleep apnea 2003   ? Pituitary adenoma (HCC)     2014   ? PTSD (post-traumatic stress disorder)    ? Recurrent UTI    ? Seasonal allergic reaction 2021   ? Sleep apnea    ? Tachycardia      Surgical History:   Procedure Laterality Date   ? HX TUBAL LIGATION  2010   ? PR NEURECTOMY HAMSTRING MUSCLE Left 2018   ? COLONOSCOPY DIAGNOSTIC WITH SPECIMEN COLLECTION BY BRUSHING/ WASHING - FLEXIBLE N/A 12/15/2018    Performed by Veneta Penton, MD at [facility] OR   ? ESOPHAGOGASTRODUODENOSCOPY WITH BIOPSY - FLEXIBLE N/A 05/06/2021    Performed by Buckles, Vinnie Level, MD at [facility] OR   ? DRUG-INDUCED SLEEP ENDOSCOPY WITH DYNAMIC EVALUATION OF VELUM/ PHARYNX/ TONGUE BASE/ AND LARYNX FOR EVALUATION OF SLEEP-DISORDERED BREATHING -?FLEXIBLE -DIAGNOSTIC N/A 06/18/2021    Performed by Lurline Idol,  MD at [facility] OR   ? ELECTROCARDIOGRAM     ? HX BLADDER SUSPENSION     ? HX CESAREAN SECTION     ? HX HYSTERECTOMY     ? HX SINUS SURGERY     ? HX TONSILLECTOMY     ? LAPAROSCOPY       Family History   Problem Relation Age of Onset   ? Coronary Artery Disease Father    ? Diabetes Type II Mother    ? Cancer Mother         leukemia   ? Diabetes Mother    ? Heart Attack Mother    ? Coronary Artery Disease Maternal Grandmother    ? Circulatory problem Maternal Grandmother    ? Coronary Artery Disease Maternal Grandfather    ? Heart Attack Maternal Grandfather    ? Heart Surgery Maternal Grandfather    ? Coronary Artery Disease Paternal Grandmother    ? Coronary Artery Disease Paternal Grandfather    ? None Reported Brother    ? None Reported Son    ? None Reported Daughter    ? None Reported Daughter    ? None Reported Daughter    ? None Reported Son    ? Glaucoma Neg Hx    ? Macular Degen Neg Hx    ? Strabismus Neg Hx    ? Retinal Detachment Neg Hx    ? Blindness Neg Hx    ? Cataract Neg Hx    ? Amblyopia Neg Hx      Social History     Tobacco Use   Smoking Status Former   ? Packs/day: 1.00   ? Years: 7.00   ? Pack years: 7.00   ? Types: Cigarettes   ? Quit date: 08/22/1992   ? Years since quitting: 28.9   Smokeless Tobacco Never     Social History     Substance and Sexual Activity   Drug Use No       PMH, SH, FH, allergies and medications above have been reviewed.       Review of Systems   Constitutional: Negative.    HENT: Positive for congestion.    Eyes: Negative.    Respiratory: Positive for apnea and cough.    Cardiovascular: Negative.    Gastrointestinal: Negative.    Endocrine: Negative.    Genitourinary: Negative.    Musculoskeletal: Negative.    Skin: Negative.    Allergic/Immunologic: Negative.    Neurological: Negative.    Hematological: Negative.    Psychiatric/Behavioral: Positive for sleep disturbance.         Objective:         ? albuterol sulfate (PROAIR HFA) 90 mcg/actuation HFA aerosol inhaler Inhale two puffs by mouth into the lungs every 6 hours as needed. Shake well before use.   ? aspirin EC 81 mg tablet Take one tablet by mouth at bedtime daily.   ? chlorthalidone (HYGROTON) 25 mg tablet Take one-half tablet by mouth daily.   ? docosahexaenoic acid/epa (FISH OIL PO) Take 3 capsules by mouth daily.   ? estradioL (VIVELLE-DOT) 0.075 mg/24 hr patch Apply one patch to top of skin as directed twice weekly.   ? ezetimibe (ZETIA) 10 mg tablet Take one tablet by mouth daily.   ? fexofenadine HCl (ALLEGRA PO) Take 1 tablet by mouth twice daily.   ? flecainide (TAMBOCOR) 100 mg tablet Take one tablet by mouth twice daily.   ? flunisolide 25 mcg (0.025 %)  nasal spray Apply one spray to each nostril as directed twice daily.   ? FLUoxetine (PROZAC) 40 mg capsule Take two capsules by mouth daily.   ? hydrOXYzine (ATARAX) 10 mg tablet Take one tablet to two tablets by mouth at bedtime as needed for Anxiety.   ? ketotifen (ZADITOR) 0.025 % (0.035 %) ophthalmic solution Place one drop into or around eye(s) twice daily as needed.   ? lamoTRIgine (LAMICTAL) 100 mg tablet Take one tablet by mouth daily. Take in addition to lamotrigine 25mg  daily for total daily dose of lamotrigine 125mg  daily.   ? lamoTRIgine (LAMICTAL) 25 mg tablet Take one tablet by mouth daily. Take in addition to lamotrigine 100mg  daily for total daily dose of lamotrigine 125mg  daily.   ? levothyroxine (SYNTHROID) 100 mcg tablet Take one tablet by mouth daily 30 minutes before breakfast.   ? magnesium oxide (MAG-OX) 400 mg (241.3 mg magnesium) tablet Take three tablets by mouth at bedtime daily. 1,200 mg = 3 tablets   ? omeprazole DR (PRILOSEC) 40 mg capsule Take one capsule by mouth daily before breakfast.   ? other medication CITRUS BERGAMOT supplement. 1 tablet in the morning  Indications: for cholesterol   ? rosuvastatin (CRESTOR) 10 mg tablet Take one tablet by mouth daily.   ? TURMERIC PO Take 3 tablets by mouth daily.   ? zinc sulfate 220 mg (50 mg elemental zinc) capsule Take one capsule by mouth daily.     Vitals:    07/13/21 1011   BP: 109/76   Pulse: 78   Temp: 36.7 ?C (98.1 ?F)   PainSc: Zero   Weight: 89.8 kg (198 lb)   Height: 162.6 cm (5' 4)     Body mass index is 33.99 kg/m?Marland Kitchen     Physical Exam  General: Well-developed, well-nourished   Communication and Voice: Clear pitch and clarity   Hearing: Hearing adequate for verbal communication bilaterally   Inspection: Normocephalic and atraumatic without mass or lesion   Palpation: Facial skeleton intact without bony stepoffs.    Facial Strength: Facial motility symmetric and full bilaterally   Pinna: External ear intact and fully developed   External canal: Canal is patent with intact skin   Tympanic Membrane: Normal bilaterally   External nose: No scar or anatomic deformity   Internal Nose: Septum intact.  MMM.  Turbinates 2+  Oral cavity, Lips, Teeth, and Gums: Mucosa and teeth intact and viable, No lesions, masses or ulcers   Oropharynx: No erythema or exudate, no masses or ulcerations, scarring (minimal palatopharyngeus) from prior tonsillectomy.  Larynx: Normal voice, no stridor or stertor.   Neck, Trachea, Lymphatics: Midline trachea without mass or lesion, no lymphadenopathy   Thyroid: No mass or nodularity   Eyes: No nystagmus with equal extraocular motion bilaterally   Neuro/Psych/Balance: Patient oriented and appropriate in interaction; Appropriate mood and affect; Gait is intact with no imbalance; Cranial nerves I-XII are intact   Respiratory effort: Equal inspiration and expiration, no respiratory distress   Peripheral Vascular: Warm extremities with equal distal pulses    PSG data and report requested and reviewed prior to visit, summarized in HPI.    Differential Diagnosis:  Deviated nasal septum, lateral pharyngeal wall redundancy, uvular hypertrophy, lingual tonsil hypertrophy, epiglottic prolapse/laryngomalasia, deviated nasal septum, macroglossia, poor neurologic tone, adenoid hypertrophy, allergic rhinitis,  obstructive or mixed or central sleep apnea, turbinate hypertrophy       Assessment and Plan:  No diagnosis found.  1. OSA (obstructive sleep apnea)  2. Intolerance of continuous positive airway pressure (CPAP) ventilation        3. BMI 33.0-33.9,adult              61 year old female with history of moderate obstructive sleep apnea who presents for discussion of hypoglossal nerve stimulator surgery.     The patient has moderate OSA and would be a great candidate for hypoglossal nerve stimulation therapy since the AHI is between 15-65, and BMI is less than 32-35.   They do not have circumferential airway collapse at the velopharynx on DISE testing and don't tolerate CPAP well.  The patient understands risks to include but not be limited to infection (requiring device explantation), device failure/lead fracture, inability to get full body/shoulder/spine (regional) MRI, marginal mandibular branch facial nerve weakness (temporary or permanent), dysphagia/dysarthria (CN XII paresis, temporary or permanent), oral lesion, scarring, pneumothorax, and persistent OSA.  They also understand that the generator will likely need to be changed every 10-12 years, depending on the settings.  Post op activation will be 1 month after surgery with in lab PSG device dose titration at 3 months, and the patient will continue to attempt CPAP use until it is deemed unnecessary.    The 2 incision approach was discussed with the patient. Potential risks include a possible increased risk of pneumothorax (cadaver study but clinical exerience has not shown this from my my experience or the experience of my peers), but benefit may be less arm mobility restrictions post op, less OR time, less respiratory sense electrode revision risk, and one less incision/scar/potential infection site to heal.  The patient was consented verbally for this FDA approved approach today with written consent to follow on the day of surgery.      We discussed expected outcomes based on my series of patients as well as typical post op course and activity restrictions.  In my first 100 patients who were implanted with Inspire, 85% had a greater than 50% reduction in events AND an AHI less than 15, while 70% had an optimal AHI titration on their post op PSG to 5 events per hour or less.    Weight loss was encouraged as medical literature shows that with each 1 point reduction in BMI there is a 9% improved probability of surgical success with upper airway stimulation surgery according to the [facility] registry.    Gentry Roch, MD, PGY 2    ENT STAFF:    I personally performed or supervised the key portions of the E/M visit, discussed case with the resident and concur with documentation of history, physical exam, assessment, and treatment plan unless otherwise noted.  I personally performed or directly visualized any procedure performed.  Changes or additions are noted in bold.    Johnny Bridge, MD

## 2021-07-23 ENCOUNTER — Encounter: Admit: 2021-07-23 | Discharge: 2021-07-23 | Payer: No Typology Code available for payment source

## 2021-07-23 DIAGNOSIS — Q615 Medullary cystic kidney: Secondary | ICD-10-CM

## 2021-07-23 DIAGNOSIS — E785 Hyperlipidemia, unspecified: Secondary | ICD-10-CM

## 2021-07-23 DIAGNOSIS — R000 Tachycardia, unspecified: Secondary | ICD-10-CM

## 2021-07-23 DIAGNOSIS — G4733 Obstructive sleep apnea (adult) (pediatric): Secondary | ICD-10-CM

## 2021-07-23 DIAGNOSIS — F99 Mental disorder, not otherwise specified: Secondary | ICD-10-CM

## 2021-07-23 DIAGNOSIS — K219 Gastro-esophageal reflux disease without esophagitis: Secondary | ICD-10-CM

## 2021-07-23 DIAGNOSIS — G4730 Sleep apnea, unspecified: Secondary | ICD-10-CM

## 2021-07-23 DIAGNOSIS — F32A Depressed: Secondary | ICD-10-CM

## 2021-07-23 DIAGNOSIS — F4310 Post-traumatic stress disorder, unspecified: Secondary | ICD-10-CM

## 2021-07-23 DIAGNOSIS — J302 Other seasonal allergic rhinitis: Secondary | ICD-10-CM

## 2021-07-23 DIAGNOSIS — E7800 Pure hypercholesterolemia, unspecified: Secondary | ICD-10-CM

## 2021-07-23 DIAGNOSIS — N390 Urinary tract infection, site not specified: Secondary | ICD-10-CM

## 2021-07-23 DIAGNOSIS — E278 Other specified disorders of adrenal gland: Secondary | ICD-10-CM

## 2021-07-23 DIAGNOSIS — Z8659 Personal history of other mental and behavioral disorders: Secondary | ICD-10-CM

## 2021-07-23 DIAGNOSIS — E039 Hypothyroidism, unspecified: Secondary | ICD-10-CM

## 2021-07-23 DIAGNOSIS — I499 Cardiac arrhythmia, unspecified: Secondary | ICD-10-CM

## 2021-07-23 DIAGNOSIS — D352 Benign neoplasm of pituitary gland: Secondary | ICD-10-CM

## 2021-07-23 NOTE — Progress Notes
Chief Complaint   Patient presents with   ? Hypothyroidism   Pituitary microadenoma    Date of Service: 07/23/2021    Holly Maldonado is a 61 y.o. female. DOB: August 03, 1960   MRN#: [id number]    HPI:  Subjective:    History of Present Illness  Holly Maldonado is a 61 y.o. female   ?  Hypothyroidism: She is on levothyroxine 100 mcg daily.  Dose was reduced earlier 2022.  She has depression.  She is compliant with medicine and takes it on an empty stomach in morning.  ?  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 He has been followed for the prolactin. She appears to have had regression of pituitary adenoma in imagine 11/2017.  No adenoma was seen in 2020 on MRI.  Last prolactin level was normal.  She denies breast complaints, discharge.    Patient has a history of depression and has been  Lithium, Prozac, Lamictal, Cymbalta in the past.  ?  She has gained weight.  She tries to exercise.    Review of Systems   Constitutional: Positive for malaise/fatigue. Negative for fever.   Cardiovascular: Negative for chest pain.   Psychiatric/Behavioral: Positive for depression.       Medical History:   Diagnosis Date   ? Adrenal nodule (HCC)     Left   ? Arrhythmia     multiple PVCs from EKG 08/14/12   ? depression    ? Dyslipidemia    ? Endometriosis    ? Esophageal reflux    ? H/O eating disorder    ? Hematuria     microscopic   ? High cholesterol    ? Hypothyroid    ? Medullary sponge kidney    ? Mental disorder    ? Nephrocalcinosis    ? Obstructive sleep apnea 2003   ? Pituitary adenoma (HCC)     2014   ? PTSD (post-traumatic stress disorder)    ? Recurrent UTI    ? Seasonal allergic reaction 2021   ? Sleep apnea    ? Tachycardia      Surgical History:   Procedure Laterality Date   ? HX TUBAL LIGATION  2010   ? PR NEURECTOMY HAMSTRING MUSCLE Left 2018   ? COLONOSCOPY DIAGNOSTIC WITH SPECIMEN COLLECTION BY BRUSHING/ WASHING - FLEXIBLE N/A 12/15/2018    Performed by Veneta Penton, MD at [facility] OR   ? ESOPHAGOGASTRODUODENOSCOPY WITH BIOPSY - FLEXIBLE N/A 05/06/2021    Performed by Buckles, Vinnie Level, MD at [facility] OR   ? DRUG-INDUCED SLEEP ENDOSCOPY WITH DYNAMIC EVALUATION OF VELUM/ PHARYNX/ TONGUE BASE/ AND LARYNX FOR EVALUATION OF SLEEP-DISORDERED BREATHING -?FLEXIBLE -DIAGNOSTIC N/A 06/18/2021    Performed by Lurline Idol, MD at [facility] OR   ? ELECTROCARDIOGRAM     ? HX BLADDER SUSPENSION     ? HX CESAREAN SECTION     ? HX HYSTERECTOMY     ? HX SINUS SURGERY     ? HX TONSILLECTOMY     ? LAPAROSCOPY       Family History   Problem Relation Age of Onset   ? Coronary Artery Disease Father    ? Diabetes Type II Mother    ? Cancer Mother  leukemia   ? Diabetes Mother    ? Heart Attack Mother    ? Coronary Artery Disease Maternal Grandmother    ? Circulatory problem Maternal Grandmother    ? Coronary Artery Disease Maternal Grandfather    ? Heart Attack Maternal Grandfather    ? Heart Surgery Maternal Grandfather    ? Coronary Artery Disease Paternal Grandmother    ? Coronary Artery Disease Paternal Grandfather    ? None Reported Brother    ? None Reported Son    ? None Reported Daughter    ? None Reported Daughter    ? None Reported Daughter    ? None Reported Son    ? Glaucoma Neg Hx    ? Macular Degen Neg Hx    ? Strabismus Neg Hx    ? Retinal Detachment Neg Hx    ? Blindness Neg Hx    ? Cataract Neg Hx    ? Amblyopia Neg Hx      Social History     Socioeconomic History   ? Marital status: Divorced   ? Number of children: 5   Tobacco Use   ? Smoking status: Former     Packs/day: 1.00     Years: 7.00     Pack years: 7.00     Types: Cigarettes     Quit date: 08/22/1992     Years since quitting: 28.9   ? Smokeless tobacco: Never   Vaping Use   ? Vaping Use: Never used   Substance and Sexual Activity   ? Alcohol use: No   ? Drug use: No ? Sexual activity: Not Currently     Partners: Male     Birth control/protection: Post-menopausal         Objective:     ? albuterol sulfate (PROAIR HFA) 90 mcg/actuation HFA aerosol inhaler Inhale two puffs by mouth into the lungs every 6 hours as needed. Shake well before use.   ? aspirin EC 81 mg tablet Take one tablet by mouth at bedtime daily.   ? chlorthalidone (HYGROTON) 25 mg tablet Take one-half tablet by mouth daily.   ? docosahexaenoic acid/epa (FISH OIL PO) Take 3 capsules by mouth daily.   ? estradioL (VIVELLE-DOT) 0.075 mg/24 hr patch Apply one patch to top of skin as directed twice weekly.   ? ezetimibe (ZETIA) 10 mg tablet Take one tablet by mouth daily.   ? fexofenadine HCl (ALLEGRA PO) Take 1 tablet by mouth twice daily.   ? flecainide (TAMBOCOR) 100 mg tablet Take one tablet by mouth twice daily.   ? flunisolide 25 mcg (0.025 %) nasal spray Apply one spray to each nostril as directed twice daily.   ? FLUoxetine (PROZAC) 40 mg capsule Take two capsules by mouth daily.   ? hydrOXYzine (ATARAX) 10 mg tablet Take one tablet to two tablets by mouth at bedtime as needed for Anxiety.   ? ketotifen (ZADITOR) 0.025 % (0.035 %) ophthalmic solution Place one drop into or around eye(s) twice daily as needed.   ? lamoTRIgine (LAMICTAL) 100 mg tablet Take one tablet by mouth daily. Take in addition to lamotrigine 25mg  daily for total daily dose of lamotrigine 125mg  daily.   ? lamoTRIgine (LAMICTAL) 25 mg tablet Take one tablet by mouth daily. Take in addition to lamotrigine 100mg  daily for total daily dose of lamotrigine 125mg  daily.   ? levothyroxine (SYNTHROID) 100 mcg tablet Take one tablet by mouth daily 30 minutes before breakfast.   ? magnesium  oxide (MAG-OX) 400 mg (241.3 mg magnesium) tablet Take three tablets by mouth at bedtime daily. 1,200 mg = 3 tablets   ? omeprazole DR (PRILOSEC) 40 mg capsule Take one capsule by mouth daily before breakfast.   ? other medication CITRUS BERGAMOT supplement. 1 tablet in the morning  Indications: for cholesterol   ? rosuvastatin (CRESTOR) 10 mg tablet Take one tablet by mouth daily.   ? TURMERIC PO Take 3 tablets by mouth daily.   ? zinc sulfate 220 mg (50 mg elemental zinc) capsule Take one capsule by mouth daily.     Vitals:    07/23/21 0951   BP: 122/82   BP Source: Arm, Left Upper   Pulse: 71   Temp: 36.4 ?C (97.5 ?F)   Resp: 16   SpO2: 96%   PainSc: Zero   Weight: 88.8 kg (195 lb 12.8 oz)   Height: 162.6 cm (5' 4)     Body mass index is 33.61 kg/m?Marland Kitchen     Physical Exam  Vitals and nursing note reviewed.   Constitutional:       General: She is not in acute distress.     Appearance: Normal appearance. She is not ill-appearing, toxic-appearing or diaphoretic.   HENT:      Head: Normocephalic and atraumatic.      Nose: Nose normal.   Eyes:      Conjunctiva/sclera: Conjunctivae normal.   Neurological:      Mental Status: She is alert and oriented to person, place, and time.   Psychiatric:         Mood and Affect: Mood normal.               Lab   Thyroid Studies    Lab Results   Component Value Date/Time    TSH 0.23 (L) 07/08/2021 01:09 PM    No results found for: Elton Sin     11/2018 MRI  There is homogeneous enhancement of the pituitary gland, which   demonstrates a somewhat flattened configuration along the sellar floor   without sellar expansion. The posterior pituitary bright spot is normal in   appearance and location. No discrete sellar or suprasellar mass is   identified. The infundibulum, ?optic chiasm, and cavernous sinuses   demonstrate normal signal and morphology. ?   The ventricles and subarachnoid spaces are normal in size and   configuration. There are a few scattered foci of supratentorial white   matter FLAIR hyperintensity, in a bifrontal and subcortical predominant   distribution. There is no midline shift or mass effect. There is no area   of abnormal contrast enhancement. The vascular flow-voids are   unremarkable. Diffusion weighted imaging is not indicative of acute or   recent infarct.       Outside labs:  02/13/2017  TSH 0.37 (reference 0.40-4.5), free T4 1.1 (reference 0.8- 1.6)  Normal CMP, normal CBC  04/20/2018  Cholesterol 276, LDL 195, HDL 79  TSH 12.12    ?  05/23/2015 MRI brain there is relatively unchanged appearance of 6 mm area of non-enhancement noted within posterior aspect of the right lateral aspect of the pituitary gland concerning for underlying pituitary microadenoma  06/22/2017 bone alkaline phosphatase normal  10/10/2017 ACTH at 9:48 AM 16.66 (reference 7.2-63.3)  10/17/2017 prolactin 42.79 (reference 4.79-23.3) normal at 99.17, FSH and LH both normal 72.36 and 31.69 respectively  11/2017 MRI brain 0.5 x 0.4 x 0.7 cm focal area of nodular enhancement along the left temporal convexity extra-axial  space 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  Minimal partial empty sella without convincing evidence of pituitary microadenoma    Assessment and Plan:    Holly Maldonado was seen today for hypothyroidism.    Diagnoses and all orders for this visit:    Acquired hypothyroidism  -     THYROID STIMULATING HORMONE-TSH; Future; Expected date: 10/08/2021  -     FREE T4 (FREE THYROXINE) ONLY; Future; Expected date: 10/08/2021  -     THYROID STIMULATING HORMONE-TSH; Future; Expected date: 02/08/2022  -     FREE T4 (FREE THYROXINE) ONLY; Future; Expected date: 02/08/2022  -     PROLACTIN; Future; Expected date: 02/08/2022    Pituitary adenoma (HCC)  -     PROLACTIN; Future; Expected date: 02/08/2022        Assessment  ?  Hx Pititary microadenoma / Partial Empty Sella  05/23/2015 MRI brain there is relatively unchanged appearance of 6 mm area of non-enhancement noted within posterior aspect of the right lateral aspect of the pituitary gland concerning for underlying pituitary microadenoma  10/10/2017 ACTH at 9:48 AM 16.66 (reference 7.2-63.3)  10/17/2017 prolactin 42.79 (reference 4.79-23.3) normal at 99.17, FSH and LH both normal 72.36 and 31.69 respectively  11/2017 MRI brain 0.5 x 0.4 x 0.7 cm focal area of nodular enhancement along the left temporal convexity extra-axial space 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  Minimal partial empty sella without convincing evidence of pituitary microadenoma  2020: No microadenoma was seen.    Prolactinoma  Previous imaging demonstrates microadenoma  10/17/2017 prolactin 42.79 (reference 4.79-23.3)  Repeat prolactin level in 6 months.  Last prolactin was okay.    Uncontrolled hypothyroidism  Dx: 1997  On replacement.  TSH is slightly low.  Will monitor for now.  Continue levothyroxine 100 mcg daily for now.  ?      Electronically signed by Lucrezia Starch, MD 07/23/2021

## 2021-07-24 ENCOUNTER — Ambulatory Visit: Admit: 2021-07-23 | Discharge: 2021-07-24 | Payer: No Typology Code available for payment source

## 2021-07-24 DIAGNOSIS — E039 Hypothyroidism, unspecified: Secondary | ICD-10-CM

## 2021-07-24 DIAGNOSIS — D352 Benign neoplasm of pituitary gland: Secondary | ICD-10-CM

## 2021-07-26 ENCOUNTER — Encounter: Admit: 2021-07-26 | Discharge: 2021-07-26 | Payer: No Typology Code available for payment source

## 2021-07-31 ENCOUNTER — Encounter: Admit: 2021-07-31 | Discharge: 2021-07-31 | Payer: No Typology Code available for payment source

## 2021-07-31 ENCOUNTER — Inpatient Hospital Stay: Admit: 2021-07-31 | Discharge: 2021-07-31 | Payer: No Typology Code available for payment source

## 2021-07-31 MED ORDER — ONDANSETRON HCL (PF) 4 MG/2 ML IJ SOLN
INTRAVENOUS | 0 refills | Status: DC
Start: 2021-07-31 — End: 2021-07-31
  Administered 2021-07-31: 21:00:00 4 mg via INTRAVENOUS

## 2021-07-31 MED ORDER — HYDROMORPHONE (PF) 2 MG/ML IJ SYRG
INTRAVENOUS | 0 refills | Status: DC
Start: 2021-07-31 — End: 2021-07-31
  Administered 2021-07-31: 21:00:00 .5 mg via INTRAVENOUS

## 2021-07-31 MED ORDER — PROPOFOL INJ 10 MG/ML IV VIAL
INTRAVENOUS | 0 refills | Status: DC
Start: 2021-07-31 — End: 2021-07-31
  Administered 2021-07-31: 18:00:00 120 mg via INTRAVENOUS

## 2021-07-31 MED ORDER — DEXAMETHASONE SODIUM PHOSPHATE 4 MG/ML IJ SOLN
INTRAVENOUS | 0 refills | Status: DC
Start: 2021-07-31 — End: 2021-07-31
  Administered 2021-07-31: 19:00:00 4 mg via INTRAVENOUS

## 2021-07-31 MED ORDER — SUGAMMADEX 100 MG/ML IV SOLN
INTRAVENOUS | 0 refills | Status: DC
Start: 2021-07-31 — End: 2021-07-31
  Administered 2021-07-31: 22:00:00 176 mg via INTRAVENOUS

## 2021-07-31 MED ORDER — MIDAZOLAM 1 MG/ML IJ SOLN
INTRAVENOUS | 0 refills | Status: DC
Start: 2021-07-31 — End: 2021-07-31
  Administered 2021-07-31: 18:00:00 2 mg via INTRAVENOUS

## 2021-07-31 MED ORDER — PHENYLEPHRINE HCL IN 0.9% NACL 1 MG/10 ML (100 MCG/ML) IV SYRG
INTRAVENOUS | 0 refills | Status: DC
Start: 2021-07-31 — End: 2021-07-31
  Administered 2021-07-31 (×2): 100 ug via INTRAVENOUS
  Administered 2021-07-31: 19:00:00 200 ug via INTRAVENOUS
  Administered 2021-07-31 (×2): 100 ug via INTRAVENOUS

## 2021-07-31 MED ORDER — ARTIFICIAL TEARS (PF) SINGLE DOSE DROPS GROUP
OPHTHALMIC | 0 refills | Status: DC
Start: 2021-07-31 — End: 2021-07-31
  Administered 2021-07-31: 19:00:00 2 [drp] via OPHTHALMIC

## 2021-07-31 MED ORDER — ROCURONIUM 10 MG/ML IV SOLN
INTRAVENOUS | 0 refills | Status: DC
Start: 2021-07-31 — End: 2021-07-31
  Administered 2021-07-31: 19:00:00 20 mg via INTRAVENOUS
  Administered 2021-07-31: 21:00:00 10 mg via INTRAVENOUS
  Administered 2021-07-31: 18:00:00 70 mg via INTRAVENOUS
  Administered 2021-07-31: 20:00:00 10 mg via INTRAVENOUS

## 2021-07-31 MED ORDER — ELECTROLYTE-A IV SOLP
INTRAVENOUS | 0 refills | Status: DC
Start: 2021-07-31 — End: 2021-07-31
  Administered 2021-07-31 (×2): via INTRAVENOUS

## 2021-07-31 MED ORDER — SUGAMMADEX 100 MG/ML IV SOLN
INTRAVENOUS | 0 refills | Status: DC
Start: 2021-07-31 — End: 2021-07-31

## 2021-07-31 MED ORDER — CEFAZOLIN 1 GRAM IJ SOLR
INTRAVENOUS | 0 refills | Status: DC
Start: 2021-07-31 — End: 2021-07-31
  Administered 2021-07-31: 19:00:00 2 g via INTRAVENOUS

## 2021-07-31 MED ORDER — FENTANYL CITRATE (PF) 50 MCG/ML IJ SOLN
INTRAVENOUS | 0 refills | Status: DC
Start: 2021-07-31 — End: 2021-07-31
  Administered 2021-07-31: 18:00:00 100 ug via INTRAVENOUS

## 2021-07-31 MED ORDER — PHENYLEPHRINE 80MCG/ML IN NS IV DRIP (DBL CONC)
INTRAVENOUS | 0 refills | Status: DC
Start: 2021-07-31 — End: 2021-07-31
  Administered 2021-07-31 (×2): .2 ug/kg/min via INTRAVENOUS

## 2021-07-31 MED ORDER — LIDOCAINE (PF) 200 MG/10 ML (2 %) IJ SYRG
INTRAVENOUS | 0 refills | Status: DC
Start: 2021-07-31 — End: 2021-07-31
  Administered 2021-07-31: 18:00:00 80 mg via INTRAVENOUS

## 2021-07-31 MED ADMIN — HYDROMORPHONE (PF) 2 MG/ML IJ SYRG [163476]: 0.5 mg | INTRAVENOUS | @ 23:00:00 | Stop: 2021-08-01 | NDC 00409131203

## 2021-07-31 MED ADMIN — FENTANYL CITRATE (PF) 50 MCG/ML IJ SOLN [3037]: 25 ug | INTRAVENOUS | @ 23:00:00 | Stop: 2021-08-01 | NDC 00409909412

## 2021-07-31 MED ADMIN — LACTATED RINGERS IV SOLP [4318]: 1000 mL | INTRAVENOUS | @ 17:00:00 | Stop: 2021-08-01 | NDC 00338011704

## 2021-07-31 MED ADMIN — HALOPERIDOL LACTATE 5 MG/ML IJ SOLN [3584]: 1 mg | INTRAVENOUS | @ 23:00:00 | Stop: 2021-07-31 | NDC 63323047400

## 2021-07-31 MED ADMIN — FENTANYL CITRATE (PF) 50 MCG/ML IJ SOLN [3037]: 25 ug | INTRAVENOUS | @ 22:00:00 | Stop: 2021-08-01 | NDC 00409909412

## 2021-07-31 MED ADMIN — SODIUM CHLORIDE 0.9 % IV SOLP [27838]: 1000.000 mL | INTRAVENOUS | @ 23:00:00 | Stop: 2021-08-02 | NDC 00338004904

## 2021-07-31 MED ADMIN — BUPIVACAINE HCL 0.25 % (2.5 MG/ML) IJ SOLN [1222]: 50 mL | INTRAMUSCULAR | @ 21:00:00 | Stop: 2021-07-31 | NDC 63323046501

## 2021-07-31 MED ADMIN — ACETAMINOPHEN 500 MG PO TAB [102]: 1000 mg | ORAL | @ 23:00:00 | Stop: 2021-07-31 | NDC 00904672080

## 2021-07-31 MED ADMIN — OXYCODONE 5 MG PO TAB [10814]: 5 mg | ORAL | @ 22:00:00 | Stop: 2021-07-31 | NDC 00406055223

## 2021-08-01 ENCOUNTER — Encounter: Admit: 2021-08-01 | Discharge: 2021-08-01 | Payer: No Typology Code available for payment source

## 2021-08-01 MED ADMIN — OXYCODONE 5 MG PO TAB [10814]: 10 mg | ORAL | @ 08:00:00 | Stop: 2021-08-01 | NDC 00904696661

## 2021-08-01 MED ADMIN — POLYETHYLENE GLYCOL 3350 17 GRAM PO PWPK [25424]: 17 g | ORAL | @ 02:00:00 | NDC 00904693186

## 2021-08-01 MED ADMIN — FLECAINIDE 100 MG PO TAB [10041]: 100 mg | ORAL | @ 02:00:00 | NDC 00054001120

## 2021-08-01 MED ADMIN — ACETAMINOPHEN 325 MG PO TAB [101]: 650 mg | ORAL | @ 11:00:00 | Stop: 2021-08-01 | NDC 00904677361

## 2021-08-01 MED ADMIN — FLUOXETINE 20 MG PO CAP [10070]: 80 mg | ORAL | @ 14:00:00 | Stop: 2021-08-01 | NDC 00904578561

## 2021-08-01 MED ADMIN — OXYBUTYNIN CHLORIDE 10 MG PO TR24 [77806]: 10 mg | ORAL | @ 14:00:00 | Stop: 2021-08-01 | NDC 27241015604

## 2021-08-01 MED ADMIN — POLYETHYLENE GLYCOL 3350 17 GRAM PO PWPK [25424]: 17 g | ORAL | @ 14:00:00 | Stop: 2021-08-01 | NDC 00904693186

## 2021-08-01 MED ADMIN — KETOROLAC 30 MG/ML (1 ML) IJ SOLN [22473]: 15 mg | INTRAVENOUS | @ 15:00:00 | Stop: 2021-08-01 | NDC 72611072201

## 2021-08-01 MED ADMIN — CHLORTHALIDONE 25 MG PO TAB [1661]: 12.5 mg | ORAL | @ 14:00:00 | Stop: 2021-08-01 | NDC 00904690061

## 2021-08-01 MED ADMIN — FENTANYL CITRATE (PF) 50 MCG/ML IJ SOLN [3037]: 25 ug | INTRAVENOUS | @ 04:00:00 | NDC 00409909412

## 2021-08-01 MED ADMIN — EZETIMIBE 10 MG PO TAB [86887]: 10 mg | ORAL | @ 14:00:00 | Stop: 2021-08-01 | NDC 67877049030

## 2021-08-01 MED ADMIN — LAMOTRIGINE 25 MG PO TAB [79386]: 125 mg | ORAL | @ 14:00:00 | Stop: 2021-08-01 | NDC 00904700761

## 2021-08-01 MED ADMIN — CEFAZOLIN INJ 1GM IVP [210319]: 2 g | INTRAVENOUS | @ 18:00:00 | Stop: 2021-08-01 | NDC 60505614200

## 2021-08-01 MED ADMIN — ACETAMINOPHEN 325 MG PO TAB [101]: 650 mg | ORAL | @ 18:00:00 | Stop: 2021-08-01 | NDC 00904677361

## 2021-08-01 MED ADMIN — LEVOTHYROXINE 50 MCG PO TAB [4421]: 100 ug | ORAL | @ 11:00:00 | Stop: 2021-08-01 | NDC 00904695061

## 2021-08-01 MED ADMIN — ACETAMINOPHEN 325 MG PO TAB [101]: 650 mg | ORAL | @ 04:00:00 | NDC 00904677361

## 2021-08-01 MED ADMIN — MELATONIN 3 MG PO TAB [16830]: 3 mg | ORAL | @ 02:00:00 | NDC 50268052411

## 2021-08-01 MED ADMIN — CEFAZOLIN INJ 1GM IVP [210319]: 2 g | INTRAVENOUS | @ 11:00:00 | Stop: 2021-08-01 | NDC 60505614200

## 2021-08-01 MED ADMIN — OXYBUTYNIN CHLORIDE 10 MG PO TR24 [77806]: 10 mg | ORAL | @ 02:00:00 | NDC 27241015604

## 2021-08-01 MED ADMIN — PANTOPRAZOLE 40 MG PO TBEC [80436]: 40 mg | ORAL | @ 02:00:00 | NDC 65862056099

## 2021-08-01 MED ADMIN — SODIUM CHLORIDE 0.9 % IV SOLP [27838]: 1000.000 mL | INTRAVENOUS | @ 08:00:00 | Stop: 2021-08-01 | NDC 00338004904

## 2021-08-01 MED ADMIN — ENOXAPARIN 40 MG/0.4 ML SC SYRG [85052]: 40 mg | SUBCUTANEOUS | @ 02:00:00 | NDC 00781324602

## 2021-08-01 MED ADMIN — OXYCODONE 5 MG PO TAB [10814]: 10 mg | ORAL | @ 01:00:00 | NDC 00904696661

## 2021-08-01 MED ADMIN — SIMETHICONE 80 MG PO CHEW [7227]: 80 mg | ORAL | @ 14:00:00 | Stop: 2021-08-01 | NDC 00904720660

## 2021-08-01 MED ADMIN — SENNOSIDES-DOCUSATE SODIUM 8.6-50 MG PO TAB [40926]: 1 | ORAL | @ 02:00:00 | NDC 00536124801

## 2021-08-01 MED ADMIN — LAMOTRIGINE 100 MG PO TAB [82659]: 125 mg | ORAL | @ 14:00:00 | Stop: 2021-08-01 | NDC 00904700861

## 2021-08-01 MED ADMIN — FLECAINIDE 100 MG PO TAB [10041]: 100 mg | ORAL | @ 14:00:00 | Stop: 2021-08-01 | NDC 00054001120

## 2021-08-01 MED ADMIN — SENNOSIDES-DOCUSATE SODIUM 8.6-50 MG PO TAB [40926]: 1 | ORAL | @ 14:00:00 | Stop: 2021-08-01 | NDC 00536124801

## 2021-08-01 MED ADMIN — OXYCODONE 5 MG PO TAB [10814]: 10 mg | ORAL | @ 14:00:00 | Stop: 2021-08-01 | NDC 00406055223

## 2021-08-01 MED ADMIN — CEFAZOLIN INJ 1GM IVP [210319]: 2 g | INTRAVENOUS | @ 02:00:00 | Stop: 2021-08-02 | NDC 60505614200

## 2021-08-01 MED FILL — OXYCODONE 5 MG PO TAB: 5 mg | ORAL | 2 days supply | Qty: 12 | Fill #1 | Status: CP

## 2021-08-02 ENCOUNTER — Encounter: Admit: 2021-08-02 | Discharge: 2021-08-02 | Payer: No Typology Code available for payment source

## 2021-08-02 DIAGNOSIS — F32A Depressed: Secondary | ICD-10-CM

## 2021-08-02 DIAGNOSIS — F4310 Post-traumatic stress disorder, unspecified: Secondary | ICD-10-CM

## 2021-08-02 DIAGNOSIS — E278 Other specified disorders of adrenal gland: Secondary | ICD-10-CM

## 2021-08-02 DIAGNOSIS — G4733 Obstructive sleep apnea (adult) (pediatric): Secondary | ICD-10-CM

## 2021-08-02 DIAGNOSIS — E785 Hyperlipidemia, unspecified: Secondary | ICD-10-CM

## 2021-08-02 DIAGNOSIS — N390 Urinary tract infection, site not specified: Secondary | ICD-10-CM

## 2021-08-02 DIAGNOSIS — G4730 Sleep apnea, unspecified: Secondary | ICD-10-CM

## 2021-08-02 DIAGNOSIS — J302 Other seasonal allergic rhinitis: Secondary | ICD-10-CM

## 2021-08-02 DIAGNOSIS — D352 Benign neoplasm of pituitary gland: Secondary | ICD-10-CM

## 2021-08-02 DIAGNOSIS — E039 Hypothyroidism, unspecified: Secondary | ICD-10-CM

## 2021-08-02 DIAGNOSIS — F99 Mental disorder, not otherwise specified: Secondary | ICD-10-CM

## 2021-08-02 DIAGNOSIS — R000 Tachycardia, unspecified: Secondary | ICD-10-CM

## 2021-08-02 DIAGNOSIS — E7800 Pure hypercholesterolemia, unspecified: Secondary | ICD-10-CM

## 2021-08-02 DIAGNOSIS — I499 Cardiac arrhythmia, unspecified: Secondary | ICD-10-CM

## 2021-08-02 DIAGNOSIS — K219 Gastro-esophageal reflux disease without esophagitis: Secondary | ICD-10-CM

## 2021-08-02 DIAGNOSIS — Q615 Medullary cystic kidney: Secondary | ICD-10-CM

## 2021-08-02 DIAGNOSIS — Z8659 Personal history of other mental and behavioral disorders: Secondary | ICD-10-CM

## 2021-08-03 ENCOUNTER — Encounter: Admit: 2021-08-03 | Discharge: 2021-08-03 | Payer: No Typology Code available for payment source

## 2021-08-03 NOTE — Telephone Encounter
I spoke to Holly Maldonado about her reported lack of bowel function. Patient reports she has passed very little gas since surgery and reports she has not had BM since OR 3/24. Patient denies nausea and vomiting, does report some gas pains and that her abdomen is firm (not hard) and bloated. I recommended to patient that she increase fluid intake, increase ambulation, and increase bowel regimen. I suggested taking OTC senna 1-2 tabs BID, increasing miralax to BID, and to consider starting dulcolax suppositiors, and/or enemas, and/or milk of magnesia. Instructed patient on s/s to report to ER such as nausea and vomiting, a hard stomach, and uncontrolled abdominal pain. Asked patient to keep office up to date with her progress. Patient verbalized understanding and is agreeable to plan.

## 2021-08-04 ENCOUNTER — Encounter: Admit: 2021-08-04 | Discharge: 2021-08-04 | Payer: No Typology Code available for payment source

## 2021-08-05 ENCOUNTER — Encounter: Admit: 2021-08-05 | Discharge: 2021-08-05 | Payer: No Typology Code available for payment source

## 2021-08-10 ENCOUNTER — Encounter: Admit: 2021-08-10 | Discharge: 2021-08-10 | Payer: No Typology Code available for payment source

## 2021-08-10 MED ORDER — HYDROXYZINE HCL 10 MG PO TAB
10-20 mg | ORAL_TABLET | Freq: Every evening | ORAL | 0 refills | 30.00000 days | Status: AC | PRN
Start: 2021-08-10 — End: ?

## 2021-08-12 ENCOUNTER — Encounter: Admit: 2021-08-12 | Discharge: 2021-08-12 | Payer: No Typology Code available for payment source

## 2021-08-12 MED ORDER — ESTRADIOL 0.075 MG/24 HR TD PTSW
MEDICATED_PATCH | 0 refills
Start: 2021-08-12 — End: ?

## 2021-08-13 ENCOUNTER — Encounter: Admit: 2021-08-13 | Discharge: 2021-08-13 | Payer: No Typology Code available for payment source

## 2021-08-13 MED ORDER — ESTRADIOL 0.05 MG/24 HR TD PTSW
1 | MEDICATED_PATCH | TRANSDERMAL | 1 refills | 30.00000 days | Status: AC
Start: 2021-08-13 — End: ?

## 2021-08-13 NOTE — Progress Notes
Per Elisa's recent office visit note with patient, patient is going to try tapering down dose of vivelle dot. Refill request for the 0.075 mg patch that was sent today denied and vivelle dot 0.05 mg sent in to patients pharmacy.

## 2021-08-24 ENCOUNTER — Encounter: Admit: 2021-08-24 | Discharge: 2021-08-24 | Payer: No Typology Code available for payment source

## 2021-08-24 ENCOUNTER — Ambulatory Visit: Admit: 2021-08-24 | Discharge: 2021-08-25 | Payer: No Typology Code available for payment source

## 2021-08-24 DIAGNOSIS — E785 Hyperlipidemia, unspecified: Secondary | ICD-10-CM

## 2021-08-24 DIAGNOSIS — F32A Depressed: Secondary | ICD-10-CM

## 2021-08-24 DIAGNOSIS — E278 Other specified disorders of adrenal gland: Secondary | ICD-10-CM

## 2021-08-24 DIAGNOSIS — G4730 Sleep apnea, unspecified: Secondary | ICD-10-CM

## 2021-08-24 DIAGNOSIS — E7800 Pure hypercholesterolemia, unspecified: Secondary | ICD-10-CM

## 2021-08-24 DIAGNOSIS — Q615 Medullary cystic kidney: Secondary | ICD-10-CM

## 2021-08-24 DIAGNOSIS — J302 Other seasonal allergic rhinitis: Secondary | ICD-10-CM

## 2021-08-24 DIAGNOSIS — F4310 Post-traumatic stress disorder, unspecified: Secondary | ICD-10-CM

## 2021-08-24 DIAGNOSIS — F99 Mental disorder, not otherwise specified: Secondary | ICD-10-CM

## 2021-08-24 DIAGNOSIS — K219 Gastro-esophageal reflux disease without esophagitis: Secondary | ICD-10-CM

## 2021-08-24 DIAGNOSIS — I499 Cardiac arrhythmia, unspecified: Secondary | ICD-10-CM

## 2021-08-24 DIAGNOSIS — R000 Tachycardia, unspecified: Secondary | ICD-10-CM

## 2021-08-24 DIAGNOSIS — E039 Hypothyroidism, unspecified: Secondary | ICD-10-CM

## 2021-08-24 DIAGNOSIS — G4733 Obstructive sleep apnea (adult) (pediatric): Secondary | ICD-10-CM

## 2021-08-24 DIAGNOSIS — D352 Benign neoplasm of pituitary gland: Secondary | ICD-10-CM

## 2021-08-24 DIAGNOSIS — Z8659 Personal history of other mental and behavioral disorders: Secondary | ICD-10-CM

## 2021-08-24 DIAGNOSIS — N390 Urinary tract infection, site not specified: Secondary | ICD-10-CM

## 2021-08-28 ENCOUNTER — Encounter: Admit: 2021-08-28 | Discharge: 2021-08-28 | Payer: No Typology Code available for payment source

## 2021-08-28 ENCOUNTER — Ambulatory Visit: Admit: 2021-08-28 | Discharge: 2021-08-29 | Payer: No Typology Code available for payment source

## 2021-08-28 DIAGNOSIS — N390 Urinary tract infection, site not specified: Secondary | ICD-10-CM

## 2021-08-28 DIAGNOSIS — E7800 Pure hypercholesterolemia, unspecified: Secondary | ICD-10-CM

## 2021-08-28 DIAGNOSIS — R000 Tachycardia, unspecified: Secondary | ICD-10-CM

## 2021-08-28 DIAGNOSIS — F502 Bulimia nervosa: Secondary | ICD-10-CM

## 2021-08-28 DIAGNOSIS — Z8659 Personal history of other mental and behavioral disorders: Secondary | ICD-10-CM

## 2021-08-28 DIAGNOSIS — D352 Benign neoplasm of pituitary gland: Secondary | ICD-10-CM

## 2021-08-28 DIAGNOSIS — F32A Depressed: Secondary | ICD-10-CM

## 2021-08-28 DIAGNOSIS — F4312 Post-traumatic stress disorder, chronic: Secondary | ICD-10-CM

## 2021-08-28 DIAGNOSIS — E785 Hyperlipidemia, unspecified: Secondary | ICD-10-CM

## 2021-08-28 DIAGNOSIS — F3341 Major depressive disorder, recurrent, in partial remission: Secondary | ICD-10-CM

## 2021-08-28 DIAGNOSIS — I499 Cardiac arrhythmia, unspecified: Secondary | ICD-10-CM

## 2021-08-28 DIAGNOSIS — Q615 Medullary cystic kidney: Secondary | ICD-10-CM

## 2021-08-28 DIAGNOSIS — J302 Other seasonal allergic rhinitis: Secondary | ICD-10-CM

## 2021-08-28 DIAGNOSIS — G4733 Obstructive sleep apnea (adult) (pediatric): Secondary | ICD-10-CM

## 2021-08-28 DIAGNOSIS — E039 Hypothyroidism, unspecified: Secondary | ICD-10-CM

## 2021-08-28 DIAGNOSIS — E278 Other specified disorders of adrenal gland: Secondary | ICD-10-CM

## 2021-08-28 DIAGNOSIS — K219 Gastro-esophageal reflux disease without esophagitis: Secondary | ICD-10-CM

## 2021-08-28 DIAGNOSIS — G4730 Sleep apnea, unspecified: Secondary | ICD-10-CM

## 2021-08-28 DIAGNOSIS — F99 Mental disorder, not otherwise specified: Secondary | ICD-10-CM

## 2021-08-28 DIAGNOSIS — F4310 Post-traumatic stress disorder, unspecified: Secondary | ICD-10-CM

## 2021-08-28 MED ORDER — TRAZODONE 50 MG PO TAB
25-50 mg | ORAL_TABLET | Freq: Every evening | ORAL | 0 refills | Status: AC | PRN
Start: 2021-08-28 — End: ?

## 2021-08-28 MED ORDER — LAMOTRIGINE 100 MG PO TAB
150 mg | ORAL_TABLET | Freq: Every day | ORAL | 0 refills | Status: AC
Start: 2021-08-28 — End: ?

## 2021-08-28 MED ORDER — FLUOXETINE 40 MG PO CAP
80 mg | ORAL_CAPSULE | Freq: Every day | ORAL | 0 refills | Status: AC
Start: 2021-08-28 — End: ?
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
## 2021-08-28 NOTE — Patient Instructions
Continue psychotropic medications as prescribed unless changes listed below:   Start melatonin 3-5 mg at night for sleep   Start trazodone as needed for sleep   Increase lamictal   Stop hydroxyzine     Return to clinic in 3 months.  Please call the clinic to reschedule or cancel appointments if somethings changes.     Our clinic is dedicated to making sure your prescriptions are filled in a timely manner during regular business hours (8am-5pm).  If you need a medication refill, please call your pharmacy to request a refill.  Please make sure to request refills at least 72 hours in advance of your last dose.  Your pharmacy will reach out to [name] electronically, which is the preferred method.  Please try to refrain from paging the on-call psychiatrist regarding medication refills (including controlled substances), as you may not receive a refill or a full refill at that time.      For nonemergent concerns, you may reach out to the clinic via MyChart. You should receive a response within 72 business hours from our clinic staff/providers.    [facility] provides its patients with 24/7 care. If you are concerned about an impending psychiatric emergency (i.e. if you have any concerns about risk of harm to yourself, risk of harm to others or feel unsure about your ability to care for yourself), you may reach out to the overnight psychiatrist on-call by calling the main [facility] operator line outside of normal business hours [phone]). If you are unsure if you are experiencing a psychiatric emergency, please ask the operator to page the on-call psychiatrist for clarification.     If you require an immediate response from a health care professional, please do not call the on-call psychiatrist and call 911 directly.     In the event of a safety concern or suicidal thoughts, call 911 or go to the nearest emergency room. National Suicide Prevention Lifeline 319-535-4670 (Talk). Crisis Text Hotline (text 351 475 7174).     It was good seeing you! Dr. [name], Psychiatry Resident

## 2021-08-28 NOTE — Progress Notes
ATTENDING NOTE  I saw and evaluated Holly Maldonado, discussed with Holly Crandall, DO and concur with the assessment and treatment plan. Patient is 61 y.o. female with MDD and PTSD. Psychiatric symptoms well controlled at today's encounter other than guilt, lack of motivation and difficulty falling asleep. Denies SI/HI and AVH and no other safety concerns. Pt reports no medication side effects.    PLAN:  The following medication changes were made during this visit to better treat the above symptoms:  1. Increase Lamictal to 150mg  Qday for mood.   2. Contiue Prozac 80mg  Qday for mood.  3. D/C Hydroxyzine due to be ineffective.  4. Begin Trazodone 25-50mg  QHS for sleep.   5. Vitamin D level    ? acetaminophen (TYLENOL) 325 mg tablet Take two tablets by mouth every 6 hours as needed.   ? albuterol sulfate (PROAIR HFA) 90 mcg/actuation HFA aerosol inhaler Inhale two puffs by mouth into the lungs every 6 hours as needed. Shake well before use.   ? aspirin EC 81 mg tablet Take one tablet by mouth at bedtime daily.   ? chlorthalidone (HYGROTON) 25 mg tablet Take one-half tablet by mouth daily.   ? docosahexaenoic acid/epa (FISH OIL PO) Take 3 capsules by mouth daily.   ? estradioL (VIVELLE-DOT) 0.05 mg/24 hr patch Apply one patch to top of skin as directed twice weekly.   ? ezetimibe (ZETIA) 10 mg tablet Take one tablet by mouth daily.   ? fexofenadine HCl (ALLEGRA PO) Take 1 tablet by mouth twice daily as needed.   ? flecainide (TAMBOCOR) 100 mg tablet Take one tablet by mouth twice daily.   ? flunisolide 25 mcg (0.025 %) nasal spray Apply one spray to each nostril as directed twice daily. (Patient taking differently: Apply one spray to each nostril as directed twice daily as needed.)   ? FLUoxetine (PROZAC) 40 mg capsule Take two capsules by mouth daily.   ? hydrOXYzine (ATARAX) 10 mg tablet Take one tablet to two tablets by mouth at bedtime as needed for Anxiety.   ? ketotifen (ZADITOR) 0.025 % (0.035 %) ophthalmic solution Place one drop into or around eye(s) twice daily as needed.   ? lamoTRIgine (LAMICTAL) 100 mg tablet Take one tablet by mouth daily. Take in addition to lamotrigine 25mg  daily for total daily dose of lamotrigine 125mg  daily.   ? lamoTRIgine (LAMICTAL) 25 mg tablet Take one tablet by mouth daily. Take in addition to lamotrigine 100mg  daily for total daily dose of lamotrigine 125mg  daily.   ? levothyroxine (SYNTHROID) 100 mcg tablet Take one tablet by mouth daily 30 minutes before breakfast.   ? magnesium oxide (MAG-OX) 400 mg (241.3 mg magnesium) tablet Take three tablets by mouth at bedtime daily. 1,200 mg = 3 tablets   ? omeprazole DR (PRILOSEC) 40 mg capsule Take one capsule by mouth daily before breakfast.   ? other medication CITRUS BERGAMOT supplement. 1 tablet in the morning  Indications: for cholesterol   ? oxyCODONE (ROXICODONE) 5 mg tablet Take one tablet by mouth every 4 hours as needed.   ? polyethylene glycol 3350 (MIRALAX) 17 g packet Take one packet by mouth daily.   ? rosuvastatin (CRESTOR) 10 mg tablet Take one tablet by mouth daily.   ? senna/docusate (SENOKOT-S) 8.6/50 mg tablet Take one tablet by mouth twice daily.   ? TURMERIC PO Take 3 tablets by mouth daily.   ? zinc sulfate 220 mg (50 mg elemental zinc) capsule Take one capsule by mouth  daily.       Holly Pagan, DO  08/28/2021

## 2021-09-02 ENCOUNTER — Encounter: Admit: 2021-09-02 | Discharge: 2021-09-02 | Payer: No Typology Code available for payment source

## 2021-09-02 DIAGNOSIS — E7800 Pure hypercholesterolemia, unspecified: Secondary | ICD-10-CM

## 2021-09-02 DIAGNOSIS — E039 Hypothyroidism, unspecified: Secondary | ICD-10-CM

## 2021-09-02 DIAGNOSIS — E559 Vitamin D deficiency, unspecified: Secondary | ICD-10-CM

## 2021-09-02 DIAGNOSIS — E119 Type 2 diabetes mellitus without complications: Secondary | ICD-10-CM

## 2021-09-03 ENCOUNTER — Encounter: Admit: 2021-09-03 | Discharge: 2021-09-03 | Payer: No Typology Code available for payment source

## 2021-09-06 ENCOUNTER — Encounter: Admit: 2021-09-06 | Discharge: 2021-09-06 | Payer: No Typology Code available for payment source

## 2021-09-06 DIAGNOSIS — K200 Eosinophilic esophagitis: Secondary | ICD-10-CM

## 2021-09-06 MED ORDER — OMEPRAZOLE 40 MG PO CPDR
ORAL_CAPSULE | 1 refills
Start: 2021-09-06 — End: ?

## 2021-09-07 ENCOUNTER — Encounter: Admit: 2021-09-07 | Discharge: 2021-09-07 | Payer: No Typology Code available for payment source

## 2021-09-08 ENCOUNTER — Ambulatory Visit: Admit: 2021-09-08 | Discharge: 2021-09-08 | Payer: No Typology Code available for payment source

## 2021-09-08 ENCOUNTER — Encounter: Admit: 2021-09-08 | Discharge: 2021-09-08 | Payer: No Typology Code available for payment source

## 2021-09-08 DIAGNOSIS — Z789 Other specified health status: Secondary | ICD-10-CM

## 2021-09-08 DIAGNOSIS — G4733 Obstructive sleep apnea (adult) (pediatric): Secondary | ICD-10-CM

## 2021-09-10 ENCOUNTER — Encounter: Admit: 2021-09-10 | Discharge: 2021-09-10 | Payer: No Typology Code available for payment source

## 2021-09-10 ENCOUNTER — Ambulatory Visit: Admit: 2021-09-10 | Discharge: 2021-09-10 | Payer: No Typology Code available for payment source

## 2021-09-10 DIAGNOSIS — D352 Benign neoplasm of pituitary gland: Secondary | ICD-10-CM

## 2021-09-10 DIAGNOSIS — G4733 Obstructive sleep apnea (adult) (pediatric): Secondary | ICD-10-CM

## 2021-09-10 DIAGNOSIS — E039 Hypothyroidism, unspecified: Secondary | ICD-10-CM

## 2021-09-10 DIAGNOSIS — G4730 Sleep apnea, unspecified: Secondary | ICD-10-CM

## 2021-09-10 DIAGNOSIS — Q615 Medullary cystic kidney: Secondary | ICD-10-CM

## 2021-09-10 DIAGNOSIS — F99 Mental disorder, not otherwise specified: Secondary | ICD-10-CM

## 2021-09-10 DIAGNOSIS — N390 Urinary tract infection, site not specified: Secondary | ICD-10-CM

## 2021-09-10 DIAGNOSIS — F4310 Post-traumatic stress disorder, unspecified: Secondary | ICD-10-CM

## 2021-09-10 DIAGNOSIS — Z8659 Personal history of other mental and behavioral disorders: Secondary | ICD-10-CM

## 2021-09-10 DIAGNOSIS — K219 Gastro-esophageal reflux disease without esophagitis: Secondary | ICD-10-CM

## 2021-09-10 DIAGNOSIS — J302 Other seasonal allergic rhinitis: Secondary | ICD-10-CM

## 2021-09-10 DIAGNOSIS — F32A Depressed: Secondary | ICD-10-CM

## 2021-09-10 DIAGNOSIS — R931 Abnormal findings on diagnostic imaging of heart and coronary circulation: Secondary | ICD-10-CM

## 2021-09-10 DIAGNOSIS — R0989 Other specified symptoms and signs involving the circulatory and respiratory systems: Secondary | ICD-10-CM

## 2021-09-10 DIAGNOSIS — I493 Ventricular premature depolarization: Secondary | ICD-10-CM

## 2021-09-10 DIAGNOSIS — E7800 Pure hypercholesterolemia, unspecified: Secondary | ICD-10-CM

## 2021-09-10 DIAGNOSIS — E278 Other specified disorders of adrenal gland: Secondary | ICD-10-CM

## 2021-09-10 DIAGNOSIS — I499 Cardiac arrhythmia, unspecified: Secondary | ICD-10-CM

## 2021-09-10 DIAGNOSIS — R000 Tachycardia, unspecified: Secondary | ICD-10-CM

## 2021-09-10 DIAGNOSIS — E785 Hyperlipidemia, unspecified: Secondary | ICD-10-CM

## 2021-09-10 DIAGNOSIS — F502 Bulimia nervosa: Secondary | ICD-10-CM

## 2021-09-10 NOTE — Patient Instructions
Follow up with Dr. Artist Beach or EP physician in  2 years.    -schedules open ~4-5 months ahead of time.          Please consider signing up for mychart.    Thank you for allowing [name] to participate in your care!    In order to provide you the best care possible we ask that you follow up as below:    For NON-URGENT questions please contact us through your MyChart account.   For all medication refills please contact your pharmacy or send a request through [facility].     For all questions that may need to be addressed urgently please call the nursing triage line at [phone] Monday - Friday 8am-3pm only.  Messages left after this timeframe will be addressed the next business day. Please leave a detailed message with your name, date of birth, and reason for your call. This number is not to be used for emergencies.    To schedule an appointment with an electrophysiology (EP) provider, call [phone], for general cardiology call [phone].     Please allow 10-14 business days for the results of any testing to be reviewed. Please call our office if you have not heard from a nurse within this time frame.    Lab and test results:  As a part of the CARES act, starting 08/09/2019, some results will be released to you via mychart immediately and automatically.  You may see results before your provider sees them; however, your provider will review all these results and then they, or one of their team, will notify you of result information and recommendations.   Critical results will be addressed immediately, but otherwise, please allow [name] time to get back with you prior to you reaching out to [name] for questions.  This will usually take about 72 hours for labs and 5-7 days for procedure test results.

## 2021-09-14 ENCOUNTER — Encounter: Admit: 2021-09-14 | Discharge: 2021-09-14 | Payer: No Typology Code available for payment source

## 2021-09-14 ENCOUNTER — Ambulatory Visit: Admit: 2021-09-14 | Discharge: 2021-09-14 | Payer: No Typology Code available for payment source

## 2021-09-14 DIAGNOSIS — E119 Type 2 diabetes mellitus without complications: Secondary | ICD-10-CM

## 2021-09-14 DIAGNOSIS — E039 Hypothyroidism, unspecified: Secondary | ICD-10-CM

## 2021-09-14 DIAGNOSIS — F3341 Major depressive disorder, recurrent, in partial remission: Secondary | ICD-10-CM

## 2021-09-14 DIAGNOSIS — E559 Vitamin D deficiency, unspecified: Secondary | ICD-10-CM

## 2021-09-14 DIAGNOSIS — E7800 Pure hypercholesterolemia, unspecified: Secondary | ICD-10-CM

## 2021-09-14 LAB — COMPREHENSIVE METABOLIC PANEL
ALBUMIN: 4.6 g/dL (ref 3.5–5.0)
ALK PHOSPHATASE: 106 U/L (ref 25–110)
ALT: 9 U/L (ref 7–56)
ANION GAP: 13 — ABNORMAL HIGH (ref 3–12)
AST: 20 U/L (ref 7–40)
CO2: 25 MMOL/L (ref 21–30)
EGFR: >60 mL/min (ref >60)
POTASSIUM: 3.3 MMOL/L — ABNORMAL LOW (ref 3.5–5.1)
SODIUM: 140 MMOL/L (ref 137–147)

## 2021-09-14 LAB — LIPID PROFILE
CHOLESTEROL: 168 mg/dL (ref <200)
HDL: 63 mg/dL (ref >40)
NON HDL CHOLESTEROL: 105 mg/dL (ref 6.0–8.0)
TRIGLYCERIDES: 99 mg/dL (ref <150)
VLDL: 20 mg/dL (ref 8.5–10.6)

## 2021-09-14 LAB — CREATINE KINASE-CPK: CK TOTAL: 107 U/L (ref <100)

## 2021-09-14 LAB — HEMOGLOBIN A1C: HEMOGLOBIN A1C: 5.8 % — ABNORMAL HIGH (ref 4.0–5.7)

## 2021-09-14 LAB — 25-OH VITAMIN D (D2 + D3): VITAMIN D (25-OH) TOTAL: 44 ng/mL (ref 30–80)

## 2021-09-15 ENCOUNTER — Encounter: Admit: 2021-09-15 | Discharge: 2021-09-15 | Payer: No Typology Code available for payment source

## 2021-09-15 DIAGNOSIS — Z789 Other specified health status: Secondary | ICD-10-CM

## 2021-09-15 DIAGNOSIS — G4733 Obstructive sleep apnea (adult) (pediatric): Secondary | ICD-10-CM

## 2021-09-16 ENCOUNTER — Encounter: Admit: 2021-09-16 | Discharge: 2021-09-16 | Payer: No Typology Code available for payment source

## 2021-09-16 MED ORDER — NEXLIZET 180-10 MG PO TAB
1 | ORAL_TABLET | Freq: Every day | ORAL | 3 refills | 30.00000 days | Status: AC
Start: 2021-09-16 — End: ?

## 2021-09-17 ENCOUNTER — Encounter: Admit: 2021-09-17 | Discharge: 2021-09-17 | Payer: No Typology Code available for payment source

## 2021-09-17 ENCOUNTER — Ambulatory Visit: Admit: 2021-09-17 | Discharge: 2021-09-18 | Payer: No Typology Code available for payment source

## 2021-09-17 DIAGNOSIS — E785 Hyperlipidemia, unspecified: Secondary | ICD-10-CM

## 2021-09-17 DIAGNOSIS — D352 Benign neoplasm of pituitary gland: Secondary | ICD-10-CM

## 2021-09-17 DIAGNOSIS — E278 Other specified disorders of adrenal gland: Secondary | ICD-10-CM

## 2021-09-17 DIAGNOSIS — I499 Cardiac arrhythmia, unspecified: Secondary | ICD-10-CM

## 2021-09-17 DIAGNOSIS — R1319 Other dysphagia: Secondary | ICD-10-CM

## 2021-09-17 DIAGNOSIS — F99 Mental disorder, not otherwise specified: Secondary | ICD-10-CM

## 2021-09-17 DIAGNOSIS — G4733 Obstructive sleep apnea (adult) (pediatric): Secondary | ICD-10-CM

## 2021-09-17 DIAGNOSIS — E7800 Pure hypercholesterolemia, unspecified: Secondary | ICD-10-CM

## 2021-09-17 DIAGNOSIS — K5900 Constipation, unspecified: Secondary | ICD-10-CM

## 2021-09-17 DIAGNOSIS — N390 Urinary tract infection, site not specified: Secondary | ICD-10-CM

## 2021-09-17 DIAGNOSIS — Z8659 Personal history of other mental and behavioral disorders: Secondary | ICD-10-CM

## 2021-09-17 DIAGNOSIS — J302 Other seasonal allergic rhinitis: Secondary | ICD-10-CM

## 2021-09-17 DIAGNOSIS — F32A Depressed: Secondary | ICD-10-CM

## 2021-09-17 DIAGNOSIS — K219 Gastro-esophageal reflux disease without esophagitis: Secondary | ICD-10-CM

## 2021-09-17 DIAGNOSIS — Q615 Medullary cystic kidney: Secondary | ICD-10-CM

## 2021-09-17 DIAGNOSIS — G4730 Sleep apnea, unspecified: Secondary | ICD-10-CM

## 2021-09-17 DIAGNOSIS — R110 Nausea: Secondary | ICD-10-CM

## 2021-09-17 DIAGNOSIS — R000 Tachycardia, unspecified: Secondary | ICD-10-CM

## 2021-09-17 DIAGNOSIS — F4310 Post-traumatic stress disorder, unspecified: Secondary | ICD-10-CM

## 2021-09-17 DIAGNOSIS — D126 Benign neoplasm of colon, unspecified: Secondary | ICD-10-CM

## 2021-09-17 DIAGNOSIS — E039 Hypothyroidism, unspecified: Secondary | ICD-10-CM

## 2021-09-17 MED ORDER — ONDANSETRON 4 MG PO TBDI
4 mg | ORAL_TABLET | ORAL | 5 refills | 8.00000 days | Status: AC | PRN
Start: 2021-09-17 — End: ?

## 2021-09-17 NOTE — Progress Notes
Telehealth Visit Note    Date of Service: 09/17/2021    Subjective:      Obtained patient's verbal consent to treat them and their agreement to [facility] financial policy and NPP via this telehealth visit during the [facility]       Holly Maldonado is a 61 y.o. female.    History of Present Illness    I had a telehealth follow-up visit with Holly Maldonado.  She originally was referred to GI clinic for evaluation of dysphagia.  I scheduled her for upper endoscopy which was performed December 2022.  This was unremarkable.  Esophageal biopsies were negative for excess eosinophils.  She had a screening colonoscopy in 2020 which showed a 3 mm adenomatous polyp.  7-year surveillance was recommended, so this will be due in 2027.  The patient takes omeprazole 40 mg daily.  She states as long as she takes this, she does not have much trouble with dysphagia.  She has been having some issues with nausea and mild constipation.  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  Patient denies any fevers or chills.       Review of Systems   Constitutional: Positive for appetite change.   HENT: Negative.    Eyes: Negative.    Respiratory: Negative.    Cardiovascular: Negative.    Gastrointestinal: Positive for nausea.   Endocrine: Negative.    Genitourinary: Negative.    Musculoskeletal: Negative.    Skin: Negative.    Allergic/Immunologic: Negative.    Neurological: Negative.    Hematological: Negative.    Psychiatric/Behavioral: Negative.    All other systems reviewed and are negative.    Objective:         ? albuterol sulfate (PROAIR HFA) 90 mcg/actuation HFA aerosol inhaler Inhale two puffs by mouth into the lungs every 6 hours as needed. Shake well before use.   ? aspirin EC 81 mg tablet Take one tablet by mouth at bedtime daily.   ? bempedoic acid-ezetimibe (NEXLIZET) 180-10 mg tablet Take one tablet by mouth daily.   ? chlorthalidone (HYGROTON) 25 mg tablet Take one-half tablet by mouth daily.   ? docosahexaenoic acid/epa (FISH OIL PO) Take 3 capsules by mouth daily.   ? estradioL (VIVELLE-DOT) 0.05 mg/24 hr patch Apply one patch to top of skin as directed twice weekly.   ? ezetimibe (ZETIA) 10 mg tablet Take one tablet by mouth daily.   ? flecainide (TAMBOCOR) 100 mg tablet Take one-half tablet by mouth twice daily.   ? flunisolide 25 mcg (0.025 %) nasal spray Apply one spray to each nostril as directed twice daily. (Patient taking differently: Apply one spray to each nostril as directed twice daily as needed.)   ? FLUoxetine (PROZAC) 40 mg capsule Take two capsules by mouth daily.   ? ketotifen (ZADITOR) 0.025 % (0.035 %) ophthalmic solution Place one drop into or around eye(s) twice daily as needed.   ? lamoTRIgine (LAMICTAL) 100 mg tablet Take 1.5 tablets by mouth daily.   ? levothyroxine (SYNTHROID) 100 mcg tablet Take one tablet by mouth daily 30 minutes before breakfast.   ? magnesium oxide (MAG-OX) 400 mg (241.3 mg magnesium) tablet Take three tablets by mouth at bedtime daily. 1,200 mg = 3 tablets   ? omeprazole DR (PRILOSEC) 40 mg capsule TAKE 1 CAPSULE BY MOUTH EVERY DAY BEFORE BREAKFAST   ? other medication CITRUS BERGAMOT supplement. 1  tablet in the morning  Indications: for cholesterol   ? polyethylene glycol 3350 (MIRALAX) 17 g packet Take one packet by mouth daily.   ? rosuvastatin (CRESTOR) 10 mg tablet Take one tablet by mouth daily.   ? senna/docusate (SENOKOT-S) 8.6/50 mg tablet Take one tablet by mouth twice daily.   ? traZODone (DESYREL) 50 mg tablet Take one-half tablet to one tablet by mouth at bedtime as needed for Sleep.   ? TURMERIC PO Take 3 tablets by mouth daily.      Telehealth Patient Reported Vitals     Row Name 09/17/21 1110                Weight: 83.9 kg (185 lb)  pe pt        Height: 162.6 cm (5' 4)        Pain Score: Zero                  Telehealth Body Mass Index: 31.75 at 09/17/2021 11:11 AM    Physical Exam  Vital signs were reviewed  Patient is not in distress  Complete physical exam was not performed due to telehealth visit only        Assessment and Plan:          Holly Maldonado was seen today for follow up.    Diagnoses and all orders for this visit:    Esophageal dysphagia    Nausea  -     ondansetron (ZOFRAN ODT) 4 mg rapid dissolve tablet; Dissolve one tablet by mouth every 8 hours as needed for Nausea or Vomiting. Place on tongue to dissolve.    Gastroesophageal reflux disease without esophagitis    Constipation, unspecified constipation type    Colon adenoma    Mrs. Schaben was having some esophageal dysphagia.  EGD did not show any obstructing lesions or esophagitis.  Random esophageal biopsies were normal.  Her symptoms have largely resolved with omeprazole daily.  I think her symptoms were secondary to nonerosive reflux.  She does not want to proceed with any motility testing at this point in time unless her symptoms get worse again.  I think she has some mild constipation which may be contributing to nausea.  I recommended adding over-the-counter MiraLAX one half scoop daily.  The patient is up-to-date with colorectal cancer screening.  I have also given her prescription for Zofran to take as needed for nausea.  I will have the patient return to clinic for follow-up.    Patient Instructions   1. Continue Omeprazole 40 mg daily  2. Add Miralax OTC 1/2 scoop daily  3. Zofran as needed for nausea  4. Colonoscopy in 2027    If you have questions or concerns that need to be addressed before your next scheduled office visit, please call my nurse at [phone] or send me a MyChart patient message.    A total of 30 minutes were spent today on this encounter.  This includes preparing for the visit, reviewing medical records, obtaining relevant history, speaking to the patient, interpreting test results, ordering medications and tests and documenting the visit in the electronic health record.

## 2021-09-17 NOTE — Patient Instructions
Continue Omeprazole 40 mg daily  Add Miralax OTC 1/2 scoop daily  Zofran as needed for nausea  Colonoscopy in 2027    If you have questions or concerns that need to be addressed before your next scheduled office visit, please call my nurse at [phone] or send me a MyChart patient message.

## 2021-09-21 ENCOUNTER — Encounter: Admit: 2021-09-21 | Discharge: 2021-09-21 | Payer: No Typology Code available for payment source

## 2021-09-24 ENCOUNTER — Encounter: Admit: 2021-09-24 | Discharge: 2021-09-24 | Payer: No Typology Code available for payment source

## 2021-09-24 DIAGNOSIS — Z789 Other specified health status: Secondary | ICD-10-CM

## 2021-09-24 DIAGNOSIS — G4733 Obstructive sleep apnea (adult) (pediatric): Secondary | ICD-10-CM

## 2021-09-24 MED ORDER — LEVOTHYROXINE 100 MCG PO TAB
100 ug | ORAL_TABLET | Freq: Every day | ORAL | 3 refills | 30.00000 days | Status: AC
Start: 2021-09-24 — End: ?

## 2021-09-25 ENCOUNTER — Encounter: Admit: 2021-09-25 | Discharge: 2021-09-25 | Payer: No Typology Code available for payment source

## 2021-10-07 ENCOUNTER — Encounter: Admit: 2021-10-07 | Discharge: 2021-10-07 | Payer: No Typology Code available for payment source

## 2021-10-12 ENCOUNTER — Encounter: Admit: 2021-10-12 | Discharge: 2021-10-12 | Payer: No Typology Code available for payment source

## 2021-10-12 ENCOUNTER — Ambulatory Visit: Admit: 2021-10-12 | Discharge: 2021-10-12 | Payer: No Typology Code available for payment source

## 2021-10-12 DIAGNOSIS — E039 Hypothyroidism, unspecified: Secondary | ICD-10-CM

## 2021-10-12 LAB — FREE T4 (FREE THYROXINE) ONLY: FREE T4: 1.2 ng/dL (ref 0.6–1.6)

## 2021-10-12 LAB — THYROID STIMULATING HORMONE-TSH: TSH: 0.1 uU/mL — ABNORMAL LOW (ref 0.35–5.00)

## 2021-10-12 MED ORDER — LEVOTHYROXINE 88 MCG PO TAB
88 ug | ORAL_TABLET | Freq: Every day | ORAL | 1 refills | 30.00000 days | Status: AC
Start: 2021-10-12 — End: ?

## 2021-10-13 ENCOUNTER — Ambulatory Visit: Admit: 2021-10-13 | Discharge: 2021-10-13 | Payer: No Typology Code available for payment source

## 2021-10-13 ENCOUNTER — Encounter: Admit: 2021-10-13 | Discharge: 2021-10-13 | Payer: No Typology Code available for payment source

## 2021-10-13 DIAGNOSIS — D352 Benign neoplasm of pituitary gland: Secondary | ICD-10-CM

## 2021-10-13 DIAGNOSIS — Z8659 Personal history of other mental and behavioral disorders: Secondary | ICD-10-CM

## 2021-10-13 DIAGNOSIS — E039 Hypothyroidism, unspecified: Secondary | ICD-10-CM

## 2021-10-13 DIAGNOSIS — F99 Mental disorder, not otherwise specified: Secondary | ICD-10-CM

## 2021-10-13 DIAGNOSIS — N390 Urinary tract infection, site not specified: Secondary | ICD-10-CM

## 2021-10-13 DIAGNOSIS — K219 Gastro-esophageal reflux disease without esophagitis: Secondary | ICD-10-CM

## 2021-10-13 DIAGNOSIS — G4733 Obstructive sleep apnea (adult) (pediatric): Secondary | ICD-10-CM

## 2021-10-13 DIAGNOSIS — E785 Hyperlipidemia, unspecified: Secondary | ICD-10-CM

## 2021-10-13 DIAGNOSIS — F4310 Post-traumatic stress disorder, unspecified: Secondary | ICD-10-CM

## 2021-10-13 DIAGNOSIS — E7800 Pure hypercholesterolemia, unspecified: Secondary | ICD-10-CM

## 2021-10-13 DIAGNOSIS — Q615 Medullary cystic kidney: Secondary | ICD-10-CM

## 2021-10-13 DIAGNOSIS — F32A Depressed: Secondary | ICD-10-CM

## 2021-10-13 DIAGNOSIS — E278 Other specified disorders of adrenal gland: Secondary | ICD-10-CM

## 2021-10-13 DIAGNOSIS — R000 Tachycardia, unspecified: Secondary | ICD-10-CM

## 2021-10-13 DIAGNOSIS — I499 Cardiac arrhythmia, unspecified: Secondary | ICD-10-CM

## 2021-10-13 DIAGNOSIS — G4730 Sleep apnea, unspecified: Secondary | ICD-10-CM

## 2021-10-13 DIAGNOSIS — J302 Other seasonal allergic rhinitis: Secondary | ICD-10-CM

## 2021-10-13 MED ORDER — REMIFENTANYL 1000MCG IN NS 20ML (OR)
INTRAVENOUS | 0 refills | Status: DC
Start: 2021-10-13 — End: 2021-10-13
  Administered 2021-10-13 (×2): .1 ug/kg/min via INTRAVENOUS

## 2021-10-13 MED ORDER — SUCCINYLCHOLINE CHLORIDE 20 MG/ML IJ SOLN
INTRAVENOUS | 0 refills | Status: DC
Start: 2021-10-13 — End: 2021-10-13
  Administered 2021-10-13: 16:00:00 80 mg via INTRAVENOUS

## 2021-10-13 MED ORDER — FENTANYL CITRATE (PF) 50 MCG/ML IJ SOLN
INTRAVENOUS | 0 refills | Status: DC
Start: 2021-10-13 — End: 2021-10-13
  Administered 2021-10-13: 16:00:00 50 ug via INTRAVENOUS

## 2021-10-13 MED ORDER — CEFAZOLIN 1 GRAM IJ SOLR
INTRAVENOUS | 0 refills | Status: DC
Start: 2021-10-13 — End: 2021-10-13
  Administered 2021-10-13: 16:00:00 2 g via INTRAVENOUS

## 2021-10-13 MED ORDER — LACTATED RINGERS IV SOLP
INTRAVENOUS | 0 refills | Status: DC
Start: 2021-10-13 — End: 2021-10-13
  Administered 2021-10-13: 16:00:00 via INTRAVENOUS

## 2021-10-13 MED ORDER — ARTIFICIAL TEARS (PF) SINGLE DOSE DROPS GROUP
OPHTHALMIC | 0 refills | Status: DC
Start: 2021-10-13 — End: 2021-10-13
  Administered 2021-10-13: 16:00:00 2 [drp] via OPHTHALMIC

## 2021-10-13 MED ORDER — PHENYLEPHRINE HCL IN 0.9% NACL 1 MG/10 ML (100 MCG/ML) IV SYRG
INTRAVENOUS | 0 refills | Status: DC
Start: 2021-10-13 — End: 2021-10-13
  Administered 2021-10-13 (×5): 100 ug via INTRAVENOUS

## 2021-10-13 MED ORDER — PROPOFOL 10 MG/ML IV EMUL 100 ML (INFUSION)(AM)(OR)
INTRAVENOUS | 0 refills | Status: DC
Start: 2021-10-13 — End: 2021-10-13
  Administered 2021-10-13: 16:00:00 150 ug/kg/min via INTRAVENOUS
  Administered 2021-10-13: 16:00:00 175 ug/kg/min via INTRAVENOUS

## 2021-10-13 MED ORDER — LIDOCAINE (PF) 200 MG/10 ML (2 %) IJ SYRG
INTRAVENOUS | 0 refills | Status: DC
Start: 2021-10-13 — End: 2021-10-13
  Administered 2021-10-13: 16:00:00 60 mg via INTRAVENOUS

## 2021-10-13 MED ORDER — DEXAMETHASONE SODIUM PHOSPHATE 4 MG/ML IJ SOLN
INTRAVENOUS | 0 refills | Status: DC
Start: 2021-10-13 — End: 2021-10-13
  Administered 2021-10-13: 16:00:00 4 mg via INTRAVENOUS

## 2021-10-13 MED ORDER — PROPOFOL INJ 10 MG/ML IV VIAL
INTRAVENOUS | 0 refills | Status: DC
Start: 2021-10-13 — End: 2021-10-13
  Administered 2021-10-13: 16:00:00 150 mg via INTRAVENOUS
  Administered 2021-10-13: 16:00:00 100 mg via INTRAVENOUS
  Administered 2021-10-13: 16:00:00 50 mg via INTRAVENOUS

## 2021-10-13 MED ORDER — ONDANSETRON HCL (PF) 4 MG/2 ML IJ SOLN
INTRAVENOUS | 0 refills | Status: DC
Start: 2021-10-13 — End: 2021-10-13
  Administered 2021-10-13: 17:00:00 4 mg via INTRAVENOUS

## 2021-10-13 MED ORDER — MIDAZOLAM 1 MG/ML IJ SOLN
INTRAVENOUS | 0 refills | Status: DC
Start: 2021-10-13 — End: 2021-10-13
  Administered 2021-10-13: 16:00:00 2 mg via INTRAVENOUS

## 2021-10-13 MED ADMIN — EPINEPHRINE 1 MG/ML (1 ML) IJ SOLN [307529]: 10 mL | INTRAMUSCULAR | @ 16:00:00 | Stop: 2021-10-13 | NDC 42023015901

## 2021-10-13 MED ADMIN — LACTATED RINGERS IV SOLP [4318]: 1000.000 mL | INTRAVENOUS | @ 17:00:00 | Stop: 2021-10-13 | NDC 00338011704

## 2021-10-13 MED ADMIN — CLINDAMYCIN PHOSPHATE 150 MG/ML IJ SOLN [1743]: 900 mg | @ 16:00:00 | Stop: 2021-10-13 | NDC 00009090211

## 2021-10-13 MED ADMIN — SODIUM CHLORIDE 0.9 % IV SOLP [27838]: 10 mL | INTRAMUSCULAR | @ 16:00:00 | Stop: 2021-10-13 | NDC 00264999900

## 2021-10-13 MED ADMIN — LACTATED RINGERS IV SOLP [4318]: 1000 mL | INTRAVENOUS | @ 14:00:00 | Stop: 2021-10-13 | NDC 00338011704

## 2021-10-13 MED FILL — CEPHALEXIN 500 MG PO CAP: 500 mg | ORAL | 7 days supply | Qty: 28 | Fill #1 | Status: AC

## 2021-10-13 MED FILL — TRAMADOL 50 MG PO TAB: 50 mg | ORAL | 5 days supply | Qty: 20 | Fill #1 | Status: AC

## 2021-10-14 ENCOUNTER — Encounter: Admit: 2021-10-14 | Discharge: 2021-10-14 | Payer: No Typology Code available for payment source

## 2021-10-15 ENCOUNTER — Encounter: Admit: 2021-10-15 | Discharge: 2021-10-15 | Payer: No Typology Code available for payment source

## 2021-10-15 DIAGNOSIS — G4733 Obstructive sleep apnea (adult) (pediatric): Secondary | ICD-10-CM

## 2021-10-15 DIAGNOSIS — E278 Other specified disorders of adrenal gland: Secondary | ICD-10-CM

## 2021-10-15 DIAGNOSIS — G4730 Sleep apnea, unspecified: Secondary | ICD-10-CM

## 2021-10-15 DIAGNOSIS — E785 Hyperlipidemia, unspecified: Secondary | ICD-10-CM

## 2021-10-15 DIAGNOSIS — I499 Cardiac arrhythmia, unspecified: Secondary | ICD-10-CM

## 2021-10-15 DIAGNOSIS — F32A Depressed: Secondary | ICD-10-CM

## 2021-10-15 DIAGNOSIS — N390 Urinary tract infection, site not specified: Secondary | ICD-10-CM

## 2021-10-15 DIAGNOSIS — E7800 Pure hypercholesterolemia, unspecified: Secondary | ICD-10-CM

## 2021-10-15 DIAGNOSIS — Z8659 Personal history of other mental and behavioral disorders: Secondary | ICD-10-CM

## 2021-10-15 DIAGNOSIS — Q615 Medullary cystic kidney: Secondary | ICD-10-CM

## 2021-10-15 DIAGNOSIS — D352 Benign neoplasm of pituitary gland: Secondary | ICD-10-CM

## 2021-10-15 DIAGNOSIS — E039 Hypothyroidism, unspecified: Secondary | ICD-10-CM

## 2021-10-15 DIAGNOSIS — R000 Tachycardia, unspecified: Secondary | ICD-10-CM

## 2021-10-15 DIAGNOSIS — F4310 Post-traumatic stress disorder, unspecified: Secondary | ICD-10-CM

## 2021-10-15 DIAGNOSIS — K219 Gastro-esophageal reflux disease without esophagitis: Secondary | ICD-10-CM

## 2021-10-15 DIAGNOSIS — J302 Other seasonal allergic rhinitis: Secondary | ICD-10-CM

## 2021-10-15 DIAGNOSIS — F99 Mental disorder, not otherwise specified: Secondary | ICD-10-CM

## 2021-10-17 ENCOUNTER — Encounter: Admit: 2021-10-17 | Discharge: 2021-10-17 | Payer: No Typology Code available for payment source

## 2021-10-19 ENCOUNTER — Encounter: Admit: 2021-10-19 | Discharge: 2021-10-19 | Payer: No Typology Code available for payment source

## 2021-10-19 MED ORDER — ESTRADIOL 0.05 MG/24 HR TD PTSW
1 | MEDICATED_PATCH | TRANSDERMAL | 1 refills
Start: 2021-10-19 — End: ?

## 2021-10-22 ENCOUNTER — Encounter: Admit: 2021-10-22 | Discharge: 2021-10-22 | Payer: No Typology Code available for payment source

## 2021-10-22 ENCOUNTER — Ambulatory Visit: Admit: 2021-10-22 | Discharge: 2021-10-22 | Payer: No Typology Code available for payment source

## 2021-10-22 DIAGNOSIS — G4733 Obstructive sleep apnea (adult) (pediatric): Secondary | ICD-10-CM

## 2021-10-22 DIAGNOSIS — E278 Other specified disorders of adrenal gland: Secondary | ICD-10-CM

## 2021-10-22 DIAGNOSIS — F99 Mental disorder, not otherwise specified: Secondary | ICD-10-CM

## 2021-10-22 DIAGNOSIS — E785 Hyperlipidemia, unspecified: Secondary | ICD-10-CM

## 2021-10-22 DIAGNOSIS — J302 Other seasonal allergic rhinitis: Secondary | ICD-10-CM

## 2021-10-22 DIAGNOSIS — F32A Depressed: Secondary | ICD-10-CM

## 2021-10-22 DIAGNOSIS — R000 Tachycardia, unspecified: Secondary | ICD-10-CM

## 2021-10-22 DIAGNOSIS — E039 Hypothyroidism, unspecified: Secondary | ICD-10-CM

## 2021-10-22 DIAGNOSIS — N390 Urinary tract infection, site not specified: Secondary | ICD-10-CM

## 2021-10-22 DIAGNOSIS — K219 Gastro-esophageal reflux disease without esophagitis: Secondary | ICD-10-CM

## 2021-10-22 DIAGNOSIS — I499 Cardiac arrhythmia, unspecified: Secondary | ICD-10-CM

## 2021-10-22 DIAGNOSIS — G4730 Sleep apnea, unspecified: Secondary | ICD-10-CM

## 2021-10-22 DIAGNOSIS — F4310 Post-traumatic stress disorder, unspecified: Secondary | ICD-10-CM

## 2021-10-22 DIAGNOSIS — E7800 Pure hypercholesterolemia, unspecified: Secondary | ICD-10-CM

## 2021-10-22 DIAGNOSIS — R430 Anosmia: Secondary | ICD-10-CM

## 2021-10-22 DIAGNOSIS — Q615 Medullary cystic kidney: Secondary | ICD-10-CM

## 2021-10-22 DIAGNOSIS — D352 Benign neoplasm of pituitary gland: Secondary | ICD-10-CM

## 2021-10-22 DIAGNOSIS — Z8659 Personal history of other mental and behavioral disorders: Secondary | ICD-10-CM

## 2021-10-22 NOTE — Progress Notes
Date of Service: 10/22/2021    Subjective:             Holly Maldonado is a 60 y.o. female.    History of Present Illness    Holly Maldonado returns today after HGNS implant on the right side on 10/13/2021.  Her activation appointment with [facility] isn't until mid July.  No dysphagia, dysarthria or lower lip weakness concerns.    She also reports that after being diagnosed with COVID 19 in 2020 she has not been able to taste or smell.  She denies nasal congestion/obstruction or recurrent sinus infection symptoms.  No head trauma history.  No phantosomia.         Review of Systems   Constitutional: Negative.    HENT: Negative.    Eyes: Negative.    Respiratory: Negative.    Cardiovascular: Negative.    Gastrointestinal: Negative.    Endocrine: Negative.    Genitourinary: Negative.    Musculoskeletal: Negative.    Skin: Negative.    Allergic/Immunologic: Negative.    Neurological: Negative.    Hematological: Negative.    Psychiatric/Behavioral: Negative.          Objective:         ? acetaminophen (ACETAMINOPHEN EXTRA STRENGTH) 500 mg tablet Take one tablet by mouth every 4 hours as needed for Pain. Max of 4,000 mg of acetaminophen in 24 hours.   ? albuterol sulfate (PROAIR HFA) 90 mcg/actuation HFA aerosol inhaler Inhale two puffs by mouth into the lungs every 6 hours as needed. Shake well before use.   ? bempedoic acid-ezetimibe (NEXLIZET) 180-10 mg tablet Take one tablet by mouth daily.   ? chlorthalidone (HYGROTON) 25 mg tablet Take one-half tablet by mouth daily.   ? docosahexaenoic acid/epa (FISH OIL PO) Take 3 capsules by mouth daily.   ? estradioL (VIVELLE-DOT) 0.05 mg/24 hr patch APPLY ONE PATCH TO TOP OF SKIN AS DIRECTED TWICE WEEKLY.   ? ezetimibe (ZETIA) 10 mg tablet Take one tablet by mouth daily.   ? flecainide (TAMBOCOR) 100 mg tablet Take one-half tablet by mouth twice daily.   ? flunisolide 25 mcg (0.025 %) nasal spray Apply one spray to each nostril as directed twice daily.   ? FLUoxetine (PROZAC) 40 mg capsule Take two capsules by mouth daily.   ? ketotifen (ZADITOR) 0.025 % (0.035 %) ophthalmic solution Place one drop into or around eye(s) twice daily as needed.   ? lamoTRIgine (LAMICTAL) 100 mg tablet Take 1.5 tablets by mouth daily.   ? levothyroxine (SYNTHROID) 88 mcg tablet Take one tablet by mouth daily 30 minutes before breakfast.   ? magnesium oxide (MAG-OX) 400 mg (241.3 mg magnesium) tablet Take three tablets by mouth at bedtime daily. 1,200 mg = 3 tablets   ? omeprazole DR (PRILOSEC) 40 mg capsule TAKE 1 CAPSULE BY MOUTH EVERY DAY BEFORE BREAKFAST   ? ondansetron (ZOFRAN ODT) 4 mg rapid dissolve tablet Dissolve one tablet by mouth every 8 hours as needed for Nausea or Vomiting. Place on tongue to dissolve.   ? other medication CITRUS BERGAMOT supplement. 1 tablet in the morning  Indications: for cholesterol   ? polyethylene glycol 3350 (MIRALAX) 17 g packet Take one packet by mouth daily.   ? rosuvastatin (CRESTOR) 10 mg tablet Take one tablet by mouth daily.   ? senna/docusate (SENOKOT-S) 8.6/50 mg tablet Take one tablet by mouth twice daily.   ? traMADoL (ULTRAM) 50 mg tablet Take one tablet by mouth every 6 hours  as needed for Pain.   ? traZODone (DESYREL) 50 mg tablet Take one-half tablet to one tablet by mouth at bedtime as needed for Sleep.   ? TURMERIC PO Take 3 tablets by mouth daily.     Vitals:    10/22/21 0846   BP: 119/78   Pulse: 62   PainSc: Zero   Weight: 82.6 kg (182 lb)   Height: 162.6 cm (5' 4)     Body mass index is 31.24 kg/m?Marland Kitchen     Physical Exam  Incision c/d/i x 2.  Mild submental edema without hematoma.  Tongue midline with normal mobility.  CN VII intact    Nasal exam shows moist mucous membranes with 2+ turbinates and midline septum. No polyps or pus. Pros and cons to endoscopy discussed and defer/refuse.       Assessment and Plan:  1. OSA (obstructive sleep apnea)        2. Anosmia          Pros and cons to CT/MRI imaging discussed, defer at this time.  Call back if new symptoms or sense of smell fails to improve with therapy below.    Dr. Salvadore Farber in [place] recommends alpha lipoic acid supplements (400-600 mg daily) with blood sugar monitoring (defer to PCP) and daily smell challenge for best chance of recovery olfactory function.  Monitor weight because most with anosmia compensate for poor taste by consuming more calories.  Consider UPSIT testing (defer today) and neurology work up if movement or memory concerns arise as hyposmia can be a primary complaint for neurodegenerative diseases such as Parkinson's or Alzheimer's.  Recommend natural gas detectors for home safety.   Recommend OTC smell stimulus kit by [employer].    Device programming/turn on approximately 2 weeks.  PSG post op in 10 weeks after acclimating to the device at home.  Continue CPAP in interval until/unless deemed unnecessary on post op PSG device titration study.  Scar massage.  Wound/sun precautions.  Serial follow up with sleep medicine provider to ensure device function and adequate disease treatment.  F/u prn awake endoscopy if partial response at PSG activation and sleep medicine to ensure device use and adequate OSA treatment.

## 2021-10-26 ENCOUNTER — Encounter: Admit: 2021-10-26 | Discharge: 2021-10-26 | Payer: No Typology Code available for payment source

## 2021-11-17 ENCOUNTER — Encounter: Admit: 2021-11-17 | Discharge: 2021-11-17 | Payer: No Typology Code available for payment source

## 2021-11-17 MED ORDER — CHLORTHALIDONE 25 MG PO TAB
ORAL_TABLET | 1 refills
Start: 2021-11-17 — End: ?

## 2021-11-24 ENCOUNTER — Encounter: Admit: 2021-11-24 | Discharge: 2021-11-24 | Payer: No Typology Code available for payment source

## 2021-11-24 ENCOUNTER — Ambulatory Visit: Admit: 2021-11-24 | Discharge: 2021-11-24 | Payer: No Typology Code available for payment source

## 2021-11-24 DIAGNOSIS — E039 Hypothyroidism, unspecified: Secondary | ICD-10-CM

## 2021-11-24 LAB — THYROID STIMULATING HORMONE-TSH: TSH: 1.5 uU/mL (ref 0.35–5.00)

## 2021-11-26 ENCOUNTER — Encounter: Admit: 2021-11-26 | Discharge: 2021-11-26 | Payer: No Typology Code available for payment source

## 2021-11-26 NOTE — Telephone Encounter
Contacted pt to bring inspire remote for upcoming appointment.Patient will bring  inspire remote.

## 2021-11-26 NOTE — Telephone Encounter
An encounter has been created for documentation only (often for preparation of an upcoming appointment or for follow up on orders/imaging or records received) and patient does not need contact RN and did not miss a phone call or appointment.     Pre-visit planning for 11/27/21 appointment with [name]    RETURN Last seen in sleep clinic 05/12/2021 with [name] / [name] Implanted 10/13/2021 [name] activation 11/26/21    Osa (Obstructive Sleep Apnea)  ? 07/2016 Moderate-severe, Mob1 in REM sleep when OSAS most severe  ?  OSA (obstructive sleep apnea)  Patient has history of moderate obstructive sleep apnea.  Patient reportedly did not tolerate CPAP therapy.  Patient was evaluated by Dr. [name] for inspire placement she is scheduled for DICE in February 2023.  Patient qualifies for hypoglossal nerve stimulation implantation.  She meets the criteria since her AHI is 22.9/hr.  Her BMI is 35.21, and she failed CPAP therapy.  We discussed with patient if she qualifies for inspire implantation, we will follow-up with her for activation and further management.  In case if she did not qualify for inspire implantation, we will follow-up with her and help with her CPAP use.  ?  BMI 35.0-35.9,adult  The benefits of weight loss in the setting of sleep apnea were discussed. Discussed with patient to continue weight loss as additional treatment for sleep disorder breathing as loss of 10% of body mass can lead to a reduction of AHI by 25-30%    Last OV note Dr Tivis Ringer 10/22/21:     Pros and cons to CT/MRI imaging discussed, defer at this time.  Call back if new symptoms or sense of smell fails to improve with therapy below.  ?  Dr. Salvadore Farber in [place] recommends alpha lipoic acid supplements (400-600 mg daily) with blood sugar monitoring (defer to PCP) and daily smell challenge for best chance of recovery olfactory function.  Monitor weight because most with anosmia compensate for poor taste by consuming more calories.  Consider UPSIT testing (defer today) and neurology work up if movement or memory concerns arise as hyposmia can be a primary complaint for neurodegenerative diseases such as Parkinson's or Alzheimer's.  Recommend natural gas detectors for home safety.   Recommend OTC smell stimulus kit by [facility]

## 2021-11-27 ENCOUNTER — Encounter: Admit: 2021-11-27 | Discharge: 2021-11-27 | Payer: No Typology Code available for payment source

## 2021-11-27 ENCOUNTER — Ambulatory Visit: Admit: 2021-11-27 | Discharge: 2021-11-28 | Payer: No Typology Code available for payment source

## 2021-11-27 DIAGNOSIS — E785 Hyperlipidemia, unspecified: Secondary | ICD-10-CM

## 2021-11-27 DIAGNOSIS — G4730 Sleep apnea, unspecified: Secondary | ICD-10-CM

## 2021-11-27 DIAGNOSIS — E278 Other specified disorders of adrenal gland: Secondary | ICD-10-CM

## 2021-11-27 DIAGNOSIS — N390 Urinary tract infection, site not specified: Secondary | ICD-10-CM

## 2021-11-27 DIAGNOSIS — J302 Other seasonal allergic rhinitis: Secondary | ICD-10-CM

## 2021-11-27 DIAGNOSIS — G4733 Obstructive sleep apnea (adult) (pediatric): Secondary | ICD-10-CM

## 2021-11-27 DIAGNOSIS — D352 Benign neoplasm of pituitary gland: Secondary | ICD-10-CM

## 2021-11-27 DIAGNOSIS — F4310 Post-traumatic stress disorder, unspecified: Secondary | ICD-10-CM

## 2021-11-27 DIAGNOSIS — Q615 Medullary cystic kidney: Secondary | ICD-10-CM

## 2021-11-27 DIAGNOSIS — F32A Depressed: Secondary | ICD-10-CM

## 2021-11-27 DIAGNOSIS — Z8659 Personal history of other mental and behavioral disorders: Secondary | ICD-10-CM

## 2021-11-27 DIAGNOSIS — E7800 Pure hypercholesterolemia, unspecified: Secondary | ICD-10-CM

## 2021-11-27 DIAGNOSIS — K219 Gastro-esophageal reflux disease without esophagitis: Secondary | ICD-10-CM

## 2021-11-27 DIAGNOSIS — F99 Mental disorder, not otherwise specified: Secondary | ICD-10-CM

## 2021-11-27 DIAGNOSIS — E039 Hypothyroidism, unspecified: Secondary | ICD-10-CM

## 2021-11-27 DIAGNOSIS — I499 Cardiac arrhythmia, unspecified: Secondary | ICD-10-CM

## 2021-11-27 DIAGNOSIS — R000 Tachycardia, unspecified: Secondary | ICD-10-CM

## 2021-11-27 NOTE — Progress Notes
Date of Service: 11/27/2021    Subjective:             Holly Maldonado is a 61 y.o. female.  S/p inspire placement 10/13/2021, planned activation today.    History of Present Illness  Holly Maldonado presents to sleep clinic for HGNS activation today.  She is a pleasant 61 year old female with history of moderate obstructive sleep apnea by PSG and HST (last 12/22) and intolerant to cpap therapy.  She was evaluated for HGNS by Dr. Tivis Ringer, implanted on 10/13/2021.  Today she reports feeling tired all the time, ESS score 21 today.  She does admit to drowsy driving and this was highly discouraged today.  She is going to bed at 10-130 at night and waking at 0800 to 0830am.  She wakes tired.  She may awaken twice at night for the restroom.  She naps occasionally for maybe 30-45 mins and feels tired after.  She also reports some insomnia, using a sleep meditation app and some melatonin for this with mixed results.  Trazodone has been tried in the past and resulted in headaches and feeling yucky.  Her family history includes sleep apnea in a brother, mother died at age 24, unknown father history, children so far without any sleep issues.  She drinks only occasional caffeine via sodas, denies any alcohol, drugs or nicotine.    Since implantation, she had some tongue weakness and numbness that has fully resolved.  Her incisions were inspected today and well healed, some residual numbness around the chin incision still.  Her HGNS was activated today without incident and fully informed on the plan, etc.       Epworth Sleepiness Scale Score: 21      Objective:         ? acetaminophen (ACETAMINOPHEN EXTRA STRENGTH) 500 mg tablet Take one tablet by mouth every 4 hours as needed for Pain. Max of 4,000 mg of acetaminophen in 24 hours.   ? albuterol sulfate (PROAIR HFA) 90 mcg/actuation HFA aerosol inhaler Inhale two puffs by mouth into the lungs every 6 hours as needed. Shake well before use.   ? chlorthalidone (HYGROTON) 25 mg tablet TAKE 1/2 TABLET BY MOUTH ONCE DAILY   ? docosahexaenoic acid/epa (FISH OIL PO) Take 3 capsules by mouth daily.   ? ezetimibe (ZETIA) 10 mg tablet Take one tablet by mouth daily.   ? flecainide (TAMBOCOR) 100 mg tablet Take one-half tablet by mouth twice daily.   ? flunisolide 25 mcg (0.025 %) nasal spray Apply one spray to each nostril as directed twice daily.   ? FLUoxetine (PROZAC) 40 mg capsule Take two capsules by mouth daily.   ? ketotifen (ZADITOR) 0.025 % (0.035 %) ophthalmic solution Place one drop into or around eye(s) twice daily as needed.   ? lamoTRIgine (LAMICTAL) 100 mg tablet Take 1.5 tablets by mouth daily.   ? levothyroxine (SYNTHROID) 88 mcg tablet Take one tablet by mouth daily 30 minutes before breakfast.   ? magnesium oxide (MAG-OX) 400 mg (241.3 mg magnesium) tablet Take three tablets by mouth at bedtime daily. 1,200 mg = 3 tablets   ? omeprazole DR (PRILOSEC) 40 mg capsule TAKE 1 CAPSULE BY MOUTH EVERY DAY BEFORE BREAKFAST   ? other medication CITRUS BERGAMOT supplement. 1 tablet in the morning  Indications: for cholesterol   ? polyethylene glycol 3350 (MIRALAX) 17 g packet Take one packet by mouth as Needed.   ? rosuvastatin (CRESTOR) 10 mg tablet Take one tablet by  mouth daily.   ? TURMERIC PO Take 3 tablets by mouth daily.     Vitals:    11/27/21 0928   BP: 108/75   BP Source: Arm, Left Upper   Pulse: 69   Temp: 36.8 ?C (98.3 ?F)   Resp: 16   SpO2: 97%   TempSrc: Oral   PainSc: Zero   Weight: 82.6 kg (182 lb)   Height: 162.6 cm (5' 4.02)     Body mass index is 31.22 kg/m?Marland Kitchen     Physical Exam  Constitutional:       Appearance: Normal appearance. She is normal weight.   HENT:      Head: Normocephalic and atraumatic.   Cardiovascular:      Rate and Rhythm: Normal rate and regular rhythm.      Pulses: Normal pulses.      Heart sounds: Normal heart sounds.   Pulmonary:      Effort: Pulmonary effort is normal.      Breath sounds: Normal breath sounds.   Abdominal:      General: Bowel sounds are normal.      Palpations: Abdomen is soft.   Musculoskeletal:      Cervical back: Neck supple.   Neurological:      Mental Status: She is alert and oriented to person, place, and time.   Psychiatric:         Mood and Affect: Mood normal.         Behavior: Behavior normal.         Thought Content: Thought content normal.         Judgment: Judgment normal.       Inspire Activation Visit Evaluation, Programming, and Education    Incision Check:     Nerve: Incision inspected. No evidence of infection.   Implant: Incision inspected. No evidence of infection.  Functional Tongue Exam: Normal tongue motion. No evidence of tongue deviation, weakness, atrophy, hypertrophy, or fasciculations. No difficulty with swallowing or speech.     Inspire ID: [id number]  Sensation Threshold (ST): 1.2  Functional Threshold (FT): 1.8  Tongue Motion Phenotype at FT: Bilateral Protrusion (BP)    Settings:  Patient Control Lower Limit: 1.8   Patient Control Upper Limit: 2.8  Outgoing Amplitude: 2.0  Pulse Width: 90  Rate: 33  Electrode Configuration: (+)(-)(+)  Sensor Waveform: The sensor waveform showed both upward and downward deflections associated with the patient's breathing pattern.     Start Delay: 30 minutes  Pause Time: 15 minutes  Therapy Duration: 8 hours     Patient Instructions:  1. The patient was given an Inspire sleep remote and a patient manual.  2. The patient was educated on proper use of the patient sleep remote.  The patient demonstrated competency with the remote and was given a quick guide and access to an instructional video.  3. The patient was instructed to use Inspire all-night, every-night.  4. The patient was instructed to increase stimulation amplitude 1 step (0.1 volts) 7 nights until reaching the maximum level or until stimulation becomes uncomfortable.  5. If stimulation becomes uncomfortable, the patient was instructed to decrease the stimulation amplitude by one step and try increasing it again in 7 nights.  6. The patient will be scheduled for an Inspire titration polysomnography in 12 weeks to assess adherence, optimize programming and evaluate efficacy.         Assessment and Plan:    Problem   Osa (Obstructive Sleep Apnea)    07/2016  Moderate-severe, Mob1 in REM sleep when OSAS most severe          OSA (obstructive sleep apnea)  S/p HGNS implantation and doing well  -Activation today without issue  -Education today on plan going forward  -Counseled on drowsy driving  -Orders placed for PSG for HGNS titration, but will delay until seen again in clinic in 2 months    Ulyess Blossom, MD  Seen with Dr. Clarene Critchley

## 2021-11-28 DIAGNOSIS — Z789 Other specified health status: Secondary | ICD-10-CM
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
## 2021-11-30 ENCOUNTER — Encounter: Admit: 2021-11-30 | Discharge: 2021-11-30 | Payer: No Typology Code available for payment source

## 2021-11-30 ENCOUNTER — Ambulatory Visit: Admit: 2021-11-30 | Discharge: 2021-12-01 | Payer: No Typology Code available for payment source

## 2021-11-30 DIAGNOSIS — F502 Bulimia nervosa: Secondary | ICD-10-CM

## 2021-11-30 DIAGNOSIS — F99 Mental disorder, not otherwise specified: Secondary | ICD-10-CM

## 2021-11-30 DIAGNOSIS — G4733 Obstructive sleep apnea (adult) (pediatric): Secondary | ICD-10-CM

## 2021-11-30 DIAGNOSIS — D352 Benign neoplasm of pituitary gland: Secondary | ICD-10-CM

## 2021-11-30 DIAGNOSIS — F331 Major depressive disorder, recurrent, moderate: Secondary | ICD-10-CM

## 2021-11-30 DIAGNOSIS — N390 Urinary tract infection, site not specified: Secondary | ICD-10-CM

## 2021-11-30 DIAGNOSIS — E785 Hyperlipidemia, unspecified: Secondary | ICD-10-CM

## 2021-11-30 DIAGNOSIS — E039 Hypothyroidism, unspecified: Secondary | ICD-10-CM

## 2021-11-30 DIAGNOSIS — I499 Cardiac arrhythmia, unspecified: Secondary | ICD-10-CM

## 2021-11-30 DIAGNOSIS — E278 Other specified disorders of adrenal gland: Secondary | ICD-10-CM

## 2021-11-30 DIAGNOSIS — J302 Other seasonal allergic rhinitis: Secondary | ICD-10-CM

## 2021-11-30 DIAGNOSIS — Q615 Medullary cystic kidney: Secondary | ICD-10-CM

## 2021-11-30 DIAGNOSIS — F4310 Post-traumatic stress disorder, unspecified: Secondary | ICD-10-CM

## 2021-11-30 DIAGNOSIS — F32A Depressed: Secondary | ICD-10-CM

## 2021-11-30 DIAGNOSIS — E7800 Pure hypercholesterolemia, unspecified: Secondary | ICD-10-CM

## 2021-11-30 DIAGNOSIS — R000 Tachycardia, unspecified: Secondary | ICD-10-CM

## 2021-11-30 DIAGNOSIS — K219 Gastro-esophageal reflux disease without esophagitis: Secondary | ICD-10-CM

## 2021-11-30 DIAGNOSIS — F4312 Post-traumatic stress disorder, chronic: Secondary | ICD-10-CM

## 2021-11-30 DIAGNOSIS — G4730 Sleep apnea, unspecified: Secondary | ICD-10-CM

## 2021-11-30 DIAGNOSIS — Z8659 Personal history of other mental and behavioral disorders: Secondary | ICD-10-CM

## 2021-11-30 MED ORDER — LAMOTRIGINE 100 MG PO TAB
200 mg | ORAL_TABLET | Freq: Every day | ORAL | 1 refills | Status: AC
Start: 2021-11-30 — End: ?

## 2021-11-30 NOTE — Progress Notes
ATTENDING NOTE  I saw and evaluated Holly Maldonado and discussed with Raquel James, MD and concur with the assessment and treatment plan. Patient is 61 y.o. female with MDD, PTSD and Bulimia Nervosa. Psychiatric symptoms well controlled at today's encounter. Denies SI/HI and AVH and no other safety concerns. Pt reports no medication side effects.    PLAN:  The following medication changes were made during this visit to better treat the above symptoms:  1. Continue Prozac 80mg  PO Daily  2. Discontinue Trazodone   3. Increase Lamictal to 200mg  PO QHS  4. No labs needed    ? acetaminophen (ACETAMINOPHEN EXTRA STRENGTH) 500 mg tablet Take one tablet by mouth every 4 hours as needed for Pain. Max of 4,000 mg of acetaminophen in 24 hours.   ? albuterol sulfate (PROAIR HFA) 90 mcg/actuation HFA aerosol inhaler Inhale two puffs by mouth into the lungs every 6 hours as needed. Shake well before use.   ? chlorthalidone (HYGROTON) 25 mg tablet TAKE 1/2 TABLET BY MOUTH ONCE DAILY   ? docosahexaenoic acid/epa (FISH OIL PO) Take 3 capsules by mouth daily.   ? ezetimibe (ZETIA) 10 mg tablet Take one tablet by mouth daily.   ? flecainide (TAMBOCOR) 100 mg tablet Take one-half tablet by mouth twice daily.   ? flunisolide 25 mcg (0.025 %) nasal spray Apply one spray to each nostril as directed twice daily.   ? FLUoxetine (PROZAC) 40 mg capsule Take two capsules by mouth daily.   ? ketotifen (ZADITOR) 0.025 % (0.035 %) ophthalmic solution Place one drop into or around eye(s) twice daily as needed.   ? lamoTRIgine (LAMICTAL) 100 mg tablet Take 1.5 tablets by mouth daily.   ? levothyroxine (SYNTHROID) 88 mcg tablet Take one tablet by mouth daily 30 minutes before breakfast.   ? magnesium oxide (MAG-OX) 400 mg (241.3 mg magnesium) tablet Take three tablets by mouth at bedtime daily. 1,200 mg = 3 tablets   ? omeprazole DR (PRILOSEC) 40 mg capsule TAKE 1 CAPSULE BY MOUTH EVERY DAY BEFORE BREAKFAST   ? other medication CITRUS BERGAMOT supplement. 1 tablet in the morning  Indications: for cholesterol   ? polyethylene glycol 3350 (MIRALAX) 17 g packet Take one packet by mouth as Needed.   ? rosuvastatin (CRESTOR) 10 mg tablet Take one tablet by mouth daily.   ? TURMERIC PO Take 3 tablets by mouth daily.       Rae Mar, MD  11/30/2021

## 2021-11-30 NOTE — Patient Instructions
Continue psychotropic medications as prescribed unless changes listed below:   - Lamotrigine/Lamictal increase to 200mg  daily for depression  - Prozac 80mg  Qday for anxiety/depression    Return to clinic in 3 months.  Please call the clinic to reschedule or cancel appointments if somethings changes.     Our clinic is dedicated to making sure your prescriptions are filled in a timely manner during regular business hours (8am-5pm).  If you need a medication refill, please call your pharmacy to request a refill.  Please make sure to request refills at least 72 hours in advance of your last dose.  Your pharmacy will reach out to [name] electronically, which is the preferred method.  Please try to refrain from paging the on-call psychiatrist regarding medication refills (including controlled substances), as you may not receive a refill or a full refill at that time.      For nonemergent concerns, you may reach out to the clinic via MyChart. You should receive a response within 72 business hours from our clinic staff/providers.    [facility] provides its patients with 24/7 care. If you are concerned about an impending psychiatric emergency (i.e. if you have any concerns about risk of harm to yourself, risk of harm to others or feel unsure about your ability to care for yourself), you may reach out to the overnight psychiatrist on-call by calling the main [facility] operator line outside of normal business hours ([phone]). If you are unsure if you are experiencing a psychiatric emergency, please ask the operator to page the on-call psychiatrist for clarification.     If you require an immediate response from a health care professional, please do not call the on-call psychiatrist and call 911 directly.     In the event of a safety concern or suicidal thoughts, call 911 or go to the nearest emergency room. National Suicide Prevention Lifeline 780-445-8434 (Talk). Crisis Text Hotline (text 603-340-5859).     It was good seeing you!   Dr. Raquel Ayaat Jansma, Psychiatry Resident

## 2021-12-15 ENCOUNTER — Encounter: Admit: 2021-12-15 | Discharge: 2021-12-15 | Payer: No Typology Code available for payment source

## 2021-12-15 NOTE — Progress Notes
Order for 24 hour urine faxed to [facility] [phone]. Confirmation rec'd. For pt to complete prior to appt with Dr. Kirby Funk in September.

## 2021-12-30 ENCOUNTER — Encounter: Admit: 2021-12-30 | Discharge: 2021-12-30 | Payer: No Typology Code available for payment source

## 2021-12-30 MED ORDER — FLUOXETINE 40 MG PO CAP
80 mg | ORAL_CAPSULE | Freq: Every day | ORAL | 0 refills | Status: AC
Start: 2021-12-30 — End: ?

## 2022-01-01 ENCOUNTER — Encounter: Admit: 2022-01-01 | Discharge: 2022-01-01 | Payer: No Typology Code available for payment source

## 2022-01-01 MED ORDER — LAMOTRIGINE 100 MG PO TAB
200 mg | ORAL_TABLET | Freq: Every day | ORAL | 0 refills | Status: AC
Start: 2022-01-01 — End: ?

## 2022-01-06 ENCOUNTER — Encounter: Admit: 2022-01-06 | Discharge: 2022-01-06 | Payer: No Typology Code available for payment source

## 2022-01-06 ENCOUNTER — Ambulatory Visit: Admit: 2022-01-06 | Discharge: 2022-01-07 | Payer: No Typology Code available for payment source

## 2022-01-06 DIAGNOSIS — J302 Other seasonal allergic rhinitis: Secondary | ICD-10-CM

## 2022-01-06 DIAGNOSIS — D352 Benign neoplasm of pituitary gland: Secondary | ICD-10-CM

## 2022-01-06 DIAGNOSIS — N390 Urinary tract infection, site not specified: Secondary | ICD-10-CM

## 2022-01-06 DIAGNOSIS — Q615 Medullary cystic kidney: Secondary | ICD-10-CM

## 2022-01-06 DIAGNOSIS — G4730 Sleep apnea, unspecified: Secondary | ICD-10-CM

## 2022-01-06 DIAGNOSIS — G4733 Obstructive sleep apnea (adult) (pediatric): Secondary | ICD-10-CM

## 2022-01-06 DIAGNOSIS — F4310 Post-traumatic stress disorder, unspecified: Secondary | ICD-10-CM

## 2022-01-06 DIAGNOSIS — E7800 Pure hypercholesterolemia, unspecified: Secondary | ICD-10-CM

## 2022-01-06 DIAGNOSIS — F32A Depressed: Secondary | ICD-10-CM

## 2022-01-06 DIAGNOSIS — I499 Cardiac arrhythmia, unspecified: Secondary | ICD-10-CM

## 2022-01-06 DIAGNOSIS — E278 Other specified disorders of adrenal gland: Secondary | ICD-10-CM

## 2022-01-06 DIAGNOSIS — E785 Hyperlipidemia, unspecified: Secondary | ICD-10-CM

## 2022-01-06 DIAGNOSIS — F99 Mental disorder, not otherwise specified: Secondary | ICD-10-CM

## 2022-01-06 DIAGNOSIS — Z8659 Personal history of other mental and behavioral disorders: Secondary | ICD-10-CM

## 2022-01-06 DIAGNOSIS — R000 Tachycardia, unspecified: Secondary | ICD-10-CM

## 2022-01-06 DIAGNOSIS — E039 Hypothyroidism, unspecified: Secondary | ICD-10-CM

## 2022-01-06 DIAGNOSIS — K219 Gastro-esophageal reflux disease without esophagitis: Secondary | ICD-10-CM

## 2022-01-18 ENCOUNTER — Encounter: Admit: 2022-01-18 | Discharge: 2022-01-18 | Payer: No Typology Code available for payment source

## 2022-01-20 ENCOUNTER — Encounter: Admit: 2022-01-20 | Discharge: 2022-01-20 | Payer: No Typology Code available for payment source

## 2022-01-20 DIAGNOSIS — N200 Calculus of kidney: Secondary | ICD-10-CM

## 2022-01-21 ENCOUNTER — Encounter: Admit: 2022-01-21 | Discharge: 2022-01-21 | Payer: No Typology Code available for payment source

## 2022-01-21 NOTE — Telephone Encounter
Contacted pt to bring inspire remote for upcoming appointment.Patient will bring  inspire remote.

## 2022-01-21 NOTE — Telephone Encounter
-----   Message from Julious Payer, MD sent at 01/20/2022 10:54 AM CDT -----  Please call the patient to complete ordered labs prior to her appointment with me next week.    Thank you.    Julious Payer, MD

## 2022-01-21 NOTE — Telephone Encounter
LVM for pt notifying lab orders are in for her to complete prior to appt. Left direct call back number if any questions.

## 2022-01-22 ENCOUNTER — Encounter: Admit: 2022-01-22 | Discharge: 2022-01-22 | Payer: No Typology Code available for payment source

## 2022-01-22 ENCOUNTER — Ambulatory Visit: Admit: 2022-01-22 | Discharge: 2022-01-23 | Payer: MEDICARE

## 2022-01-22 ENCOUNTER — Encounter: Admit: 2022-01-22 | Discharge: 2022-01-22 | Payer: PRIVATE HEALTH INSURANCE

## 2022-01-22 ENCOUNTER — Ambulatory Visit: Admit: 2022-01-22 | Discharge: 2022-01-22 | Payer: MEDICARE

## 2022-01-22 DIAGNOSIS — F4310 Post-traumatic stress disorder, unspecified: Secondary | ICD-10-CM

## 2022-01-22 DIAGNOSIS — E7800 Pure hypercholesterolemia, unspecified: Secondary | ICD-10-CM

## 2022-01-22 DIAGNOSIS — N200 Calculus of kidney: Secondary | ICD-10-CM

## 2022-01-22 DIAGNOSIS — Z8659 Personal history of other mental and behavioral disorders: Secondary | ICD-10-CM

## 2022-01-22 DIAGNOSIS — D352 Benign neoplasm of pituitary gland: Secondary | ICD-10-CM

## 2022-01-22 DIAGNOSIS — F99 Mental disorder, not otherwise specified: Secondary | ICD-10-CM

## 2022-01-22 DIAGNOSIS — I499 Cardiac arrhythmia, unspecified: Secondary | ICD-10-CM

## 2022-01-22 DIAGNOSIS — R000 Tachycardia, unspecified: Secondary | ICD-10-CM

## 2022-01-22 DIAGNOSIS — G4730 Sleep apnea, unspecified: Secondary | ICD-10-CM

## 2022-01-22 DIAGNOSIS — G4733 Obstructive sleep apnea (adult) (pediatric): Secondary | ICD-10-CM

## 2022-01-22 DIAGNOSIS — F32A Depressed: Secondary | ICD-10-CM

## 2022-01-22 DIAGNOSIS — N390 Urinary tract infection, site not specified: Secondary | ICD-10-CM

## 2022-01-22 DIAGNOSIS — E278 Other specified disorders of adrenal gland: Secondary | ICD-10-CM

## 2022-01-22 DIAGNOSIS — E039 Hypothyroidism, unspecified: Secondary | ICD-10-CM

## 2022-01-22 DIAGNOSIS — E785 Hyperlipidemia, unspecified: Secondary | ICD-10-CM

## 2022-01-22 DIAGNOSIS — Z789 Other specified health status: Secondary | ICD-10-CM

## 2022-01-22 DIAGNOSIS — K219 Gastro-esophageal reflux disease without esophagitis: Secondary | ICD-10-CM

## 2022-01-22 DIAGNOSIS — J302 Other seasonal allergic rhinitis: Secondary | ICD-10-CM

## 2022-01-22 DIAGNOSIS — Q615 Medullary cystic kidney: Secondary | ICD-10-CM

## 2022-01-22 LAB — PARATHYROID HORMONE: PTH HORMONE: 68 pg/mL — ABNORMAL HIGH (ref 10–65)

## 2022-01-22 LAB — BASIC METABOLIC PANEL
ANION GAP: 10 MMOL/L (ref 3–12)
POTASSIUM: 3.3 MMOL/L — ABNORMAL LOW (ref 3.5–5.1)
SODIUM: 140 MMOL/L (ref 137–147)

## 2022-01-22 LAB — PHOSPHORUS: PHOSPHORUS: 3.3 mg/dL (ref 2.0–4.5)

## 2022-01-22 LAB — 25-OH VITAMIN D (D2 + D3): VITAMIN D (25-OH) TOTAL: 40 ng/mL (ref 30–80)

## 2022-01-22 NOTE — Telephone Encounter
I called the patient to discuss the recent  results but I could not reach the patient.  I left a voicemail message  to call back to the kidney clinic .    Julious Payer, MD

## 2022-01-27 ENCOUNTER — Encounter: Admit: 2022-01-27 | Discharge: 2022-01-27 | Payer: No Typology Code available for payment source

## 2022-01-27 ENCOUNTER — Ambulatory Visit: Admit: 2022-01-27 | Discharge: 2022-01-28 | Payer: MEDICARE

## 2022-01-27 DIAGNOSIS — G4730 Sleep apnea, unspecified: Secondary | ICD-10-CM

## 2022-01-27 DIAGNOSIS — E785 Hyperlipidemia, unspecified: Secondary | ICD-10-CM

## 2022-01-27 DIAGNOSIS — J302 Other seasonal allergic rhinitis: Secondary | ICD-10-CM

## 2022-01-27 DIAGNOSIS — N390 Urinary tract infection, site not specified: Secondary | ICD-10-CM

## 2022-01-27 DIAGNOSIS — E039 Hypothyroidism, unspecified: Secondary | ICD-10-CM

## 2022-01-27 DIAGNOSIS — R000 Tachycardia, unspecified: Secondary | ICD-10-CM

## 2022-01-27 DIAGNOSIS — E278 Other specified disorders of adrenal gland: Secondary | ICD-10-CM

## 2022-01-27 DIAGNOSIS — Z8659 Personal history of other mental and behavioral disorders: Secondary | ICD-10-CM

## 2022-01-27 DIAGNOSIS — F4310 Post-traumatic stress disorder, unspecified: Secondary | ICD-10-CM

## 2022-01-27 DIAGNOSIS — E213 Hyperparathyroidism, unspecified: Secondary | ICD-10-CM

## 2022-01-27 DIAGNOSIS — I499 Cardiac arrhythmia, unspecified: Secondary | ICD-10-CM

## 2022-01-27 DIAGNOSIS — E7800 Pure hypercholesterolemia, unspecified: Secondary | ICD-10-CM

## 2022-01-27 DIAGNOSIS — F32A Depressed: Secondary | ICD-10-CM

## 2022-01-27 DIAGNOSIS — K219 Gastro-esophageal reflux disease without esophagitis: Secondary | ICD-10-CM

## 2022-01-27 DIAGNOSIS — F99 Mental disorder, not otherwise specified: Secondary | ICD-10-CM

## 2022-01-27 DIAGNOSIS — G4733 Obstructive sleep apnea (adult) (pediatric): Secondary | ICD-10-CM

## 2022-01-27 DIAGNOSIS — D352 Benign neoplasm of pituitary gland: Secondary | ICD-10-CM

## 2022-01-27 DIAGNOSIS — E210 Primary hyperparathyroidism: Secondary | ICD-10-CM

## 2022-01-27 DIAGNOSIS — Q615 Medullary cystic kidney: Secondary | ICD-10-CM

## 2022-01-27 NOTE — Patient Instructions
To help prevent recurrence of kidney stones, please follow the following instructions:    High fluid intake (> 3000 mL/ day; 3 liters/ day)  At least ten 10 oz glasses/ day  Sodium restriction  Avoid salty foods & using the salt shaker  2000 - 3000 mg sodium (Na)/ day  Moderate Calcium Intake  600 - 1100 calcium (Ca) mg/ day  Oxalate Restriction  Avoid nuts, spinach, chocolate, tea, potatoes, rhubarb, vitamin C supplements  Helpful resource:  www.ohf.org/docs/Oxalate2008.pdf  Avoid excessive animal protein intake  3 - 7 ounces/ day  Increase potassium-rich citrus product intake  Citric fruits & fruit juices (ex: oranges, lemons, limes, grapefuit)  Caution: grapefruit & grapefruit juice may interfere with the metabolism of many prescription medications, so discuss your medication list with you primary care provider prior to starting a grapefruit regimen.)

## 2022-01-27 NOTE — Progress Notes
History      CC: Nephrolithiasis.    HPI: Holly Maldonado is a 61 y.o. female with past medical history of nephrolithiasis and medullary sponge kidney disease.  Nephrolithiasis: First stone episode was 22 years ago. Stone was not analyzed.  Most recent stones were found incidentally on renal ultrasound from 09/01.  Fluid intake:3-4 glasses daily.  Salt: does not add excess salt.  Dairy: does not drink milk, eats cheese.  Surgeries: none  Denies hx of chronic diarrhea.  Interval history: no significant health events. She has not passed any kidney stone.  She denies flank pain. She admits she needs to increase her water intake.     Interval history 01-27-2022: She has not passed any kidney stone. Denies flank / abdominal pain.  Fluid: 30-40 fl oz. Sometimes a cup of coffee.  Salt/ dairy intake unchanged.              Past Medical History   Medical History:   Diagnosis Date   ? Adrenal nodule (HCC)     Left   ? Arrhythmia     multiple PVCs from EKG 08/14/12   ? depression    ? Dyslipidemia    ? Endometriosis    ? Esophageal reflux    ? H/O eating disorder    ? Hematuria     microscopic   ? High cholesterol    ? Hypothyroid    ? Medullary sponge kidney    ? Mental disorder    ? Nephrocalcinosis    ? Obstructive sleep apnea 2003   ? Pituitary adenoma (HCC)     2014   ? PTSD (post-traumatic stress disorder)    ? Recurrent UTI    ? Seasonal allergic reaction 2021   ? Sleep apnea    ? Tachycardia         Family History  Family History   Problem Relation Age of Onset   ? Coronary Artery Disease Father    ? Diabetes Type II Mother    ? Cancer Mother         leukemia   ? Diabetes Mother    ? Heart Attack Mother         Died at 71 from heart attack   ? Coronary Artery Disease Maternal Grandmother    ? Circulatory problem Maternal Grandmother    ? Coronary Artery Disease Maternal Grandfather    ? Heart Attack Maternal Grandfather    ? Heart Surgery Maternal Grandfather    ? Coronary Artery Disease Paternal Grandmother    ? Coronary Artery Disease Paternal Grandfather    ? None Reported Brother    ? None Reported Son    ? None Reported Daughter    ? None Reported Daughter    ? None Reported Daughter    ? None Reported Son    ? Glaucoma Neg Hx    ? Macular Degen Neg Hx    ? Strabismus Neg Hx    ? Retinal Detachment Neg Hx    ? Blindness Neg Hx    ? Cataract Neg Hx    ? Amblyopia Neg Hx         Social History  Social History     Socioeconomic History   ? Marital status: Divorced   ? Number of children: 5   Tobacco Use   ? Smoking status: Former     Packs/day: 1.00     Years: 7.00     Additional pack years: 0.00  Total pack years: 7.00     Types: Cigarettes     Quit date: 08/22/1992     Years since quitting: 29.4   ? Smokeless tobacco: Never   Vaping Use   ? Vaping Use: Never used   Substance and Sexual Activity   ? Alcohol use: No   ? Drug use: No   ? Sexual activity: Not Currently     Partners: Male     Birth control/protection: Post-menopausal        Medication    HOME MEDS  ? chlorthalidone (HYGROTON) 25 mg tablet TAKE 1/2 TABLET BY MOUTH ONCE DAILY   ? docosahexaenoic acid/epa (FISH OIL PO) Take 3 capsules by mouth daily.   ? ezetimibe (ZETIA) 10 mg tablet Take one tablet by mouth daily.   ? flecainide (TAMBOCOR) 100 mg tablet Take one-half tablet by mouth twice daily.   ? flunisolide 25 mcg (0.025 %) nasal spray Apply one spray to each nostril as directed twice daily.   ? FLUoxetine (PROZAC) 40 mg capsule Take two capsules by mouth daily.   ? ketotifen (ZADITOR) 0.025 % (0.035 %) ophthalmic solution Place one drop into or around eye(s) twice daily as needed.   ? lamoTRIgine (LAMICTAL) 100 mg tablet Take two tablets by mouth daily.   ? levothyroxine (SYNTHROID) 88 mcg tablet Take one tablet by mouth daily 30 minutes before breakfast.   ? magnesium oxide (MAG-OX) 400 mg (241.3 mg magnesium) tablet Take three tablets by mouth at bedtime daily. 1,200 mg = 3 tablets   ? omeprazole DR (PRILOSEC) 40 mg capsule TAKE 1 CAPSULE BY MOUTH EVERY DAY BEFORE BREAKFAST   ? other medication CITRUS BERGAMOT supplement. 1 tablet in the morning  Indications: for cholesterol   ? rosuvastatin (CRESTOR) 10 mg tablet Take one tablet by mouth daily.   ? TURMERIC PO Take 3 tablets by mouth daily.          Review of Systems  Constitutional: negative  Eyes: negative  Ears, nose, mouth, throat, and face: negative  Respiratory: negative  Cardiovascular: negative  Gastrointestinal: negative  Genitourinary:negative  Integument/breast: negative  Hematologic/lymphatic: negative  Musculoskeletal:negative  Neurological: negative  Endocrine: negative      Physical Exam        Vitals:    01/27/22 1012 01/27/22 1015   BP: 117/74 113/67   BP Source: Arm, Left Upper Arm, Left Upper   Pulse: 67 70   PainSc: Zero    Weight: 82.6 kg (182 lb)    Height: 162.6 cm (5' 4)      Body mass index is 31.24 kg/m?.    Gen: Alert and Oriented, No Acute Distress   HEENT: Sclera normal; MMM  CV:  S1 and S2 normal, no rubs, murmurs or gallops   Pulm: Clear to Auscultation bilateral   GI: BS+ x4, non-tender to palpation  Neuro: Grossly normal, moving all extremities, speech intact  Ext: no edema, clubbing or cyanosis   Skin: no rash     Assessment and Plan        Holly Maldonado is a 61 y.o. female        -Nephrolithiasis: Passed  stone more than 20 years ago.  Most recent CT scan shows several 2 to 3 mm right renal calculi.  Her initial  24-hour urine collection was significant for hypercalciuria and elevated urine sodium.  I started her on chlorthalidone 12.5 mg daily.  Repeat urine profile shows reduction in her urine calcium and urine sodium.  Urine creatinine reduced as well. It is therefore possible that this change could be due to errors in the collection.  I reviewed the urine collection process with the patient and she appears to have done it correctly.   I am concerned about her fluid intake . I discussed with her to increase this to at least 70-88fl oz  Keep to a low-sodium diet.  I will  Hold off with further increase in the chlorthalidone because of her blood pressure.        - Hyperparathyroidism: PTH is elevated with normal vitamin D, phosphate and calcium at upper limit of normal.  Of note had 24-hour urine calcium in November 2022 was 375 mg per 24 hours which has improved to 219mg /24 hours.  I will repeat PTH/calcium and 24 hour urine collection prior to her next appointment. If this is persistent together with hypercalciuria, we may have to consider referral for evaluation for parathyroidectomy.,  She has history of pituitary adenoma, which resolved without treatment. 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  She is hypothyroid. If indeed she has primary hyperparathyroidism, we may have to consider genetic evaluation.  Reassess labs in 6months .     - Medullary sponge kidney disease: We do not have access to the primary radiological diagnosis.      -Hypokalemia: This is a recurrent problem.  Had last urine pH on a 24-hour urine assessment was 7.6 and so I will avoid potassium citrate.  She is taking high potassium diet. We will follow up repeat BMP in 4 weeks. If persistent, we will consider starting potassium supplements.      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    Doran Durand, MD      Patient Instructions     To help prevent recurrence of kidney stones, please follow the following instructions:    ? High fluid intake (> 3000 mL/ day; 3 liters/ day)  ? At least ten 10 oz glasses/ day  ? Sodium restriction  ? Avoid salty foods & using the salt shaker  ? 2000 - 3000 mg sodium (Na)/ day  ? Moderate Calcium Intake  ? 600 - 1100 calcium (Ca) mg/ day  ? Oxalate Restriction  ? Avoid nuts, spinach, chocolate, tea, potatoes, rhubarb, vitamin C supplements  ? Helpful resource:  www.[employer].no.pdf  ? Avoid excessive animal protein intake  ? 3 - 7 ounces/ day  ? Increase potassium-rich citrus product intake  ? Citric fruits & fruit juices (ex: oranges, lemons, limes, grapefuit)  ? Caution: grapefruit & grapefruit juice may interfere with the metabolism of many prescription medications, so discuss your medication list with you primary care provider prior to starting a grapefruit regimen.)               6months

## 2022-01-28 ENCOUNTER — Encounter: Admit: 2022-01-28 | Discharge: 2022-01-28 | Payer: No Typology Code available for payment source

## 2022-01-28 DIAGNOSIS — N200 Calculus of kidney: Secondary | ICD-10-CM

## 2022-01-28 NOTE — Telephone Encounter
I called and spoke to the patient about her labs. We will repeat PTH/calcium / urine labs in 3 months. If labs consistent with primary hyperparathyroidism, I will refer her to PTH board/Endo.    Julious Payer, MD

## 2022-02-02 ENCOUNTER — Ambulatory Visit: Admit: 2022-02-02 | Discharge: 2022-02-03 | Payer: MEDICARE

## 2022-02-02 ENCOUNTER — Encounter: Admit: 2022-02-02 | Discharge: 2022-02-02 | Payer: No Typology Code available for payment source

## 2022-02-02 DIAGNOSIS — E785 Hyperlipidemia, unspecified: Secondary | ICD-10-CM

## 2022-02-02 DIAGNOSIS — Z8659 Personal history of other mental and behavioral disorders: Secondary | ICD-10-CM

## 2022-02-02 DIAGNOSIS — F99 Mental disorder, not otherwise specified: Secondary | ICD-10-CM

## 2022-02-02 DIAGNOSIS — E039 Hypothyroidism, unspecified: Secondary | ICD-10-CM

## 2022-02-02 DIAGNOSIS — J302 Other seasonal allergic rhinitis: Secondary | ICD-10-CM

## 2022-02-02 DIAGNOSIS — Q615 Medullary cystic kidney: Secondary | ICD-10-CM

## 2022-02-02 DIAGNOSIS — R000 Tachycardia, unspecified: Secondary | ICD-10-CM

## 2022-02-02 DIAGNOSIS — D352 Benign neoplasm of pituitary gland: Secondary | ICD-10-CM

## 2022-02-02 DIAGNOSIS — K219 Gastro-esophageal reflux disease without esophagitis: Secondary | ICD-10-CM

## 2022-02-02 DIAGNOSIS — I499 Cardiac arrhythmia, unspecified: Secondary | ICD-10-CM

## 2022-02-02 DIAGNOSIS — F4310 Post-traumatic stress disorder, unspecified: Secondary | ICD-10-CM

## 2022-02-02 DIAGNOSIS — G4733 Obstructive sleep apnea (adult) (pediatric): Secondary | ICD-10-CM

## 2022-02-02 DIAGNOSIS — G4730 Sleep apnea, unspecified: Secondary | ICD-10-CM

## 2022-02-02 DIAGNOSIS — E213 Hyperparathyroidism, unspecified: Secondary | ICD-10-CM

## 2022-02-02 DIAGNOSIS — F32A Depressed: Secondary | ICD-10-CM

## 2022-02-02 DIAGNOSIS — E7800 Pure hypercholesterolemia, unspecified: Secondary | ICD-10-CM

## 2022-02-02 DIAGNOSIS — E278 Other specified disorders of adrenal gland: Secondary | ICD-10-CM

## 2022-02-02 DIAGNOSIS — N390 Urinary tract infection, site not specified: Secondary | ICD-10-CM

## 2022-02-02 NOTE — Progress Notes
Obtained patient's verbal consent to treat them and their agreement to [facility] financial policy and NPP via this telehealth visit during the Coronavirus Public Health Emergency

## 2022-02-02 NOTE — Progress Notes
Chief Complaint   Patient presents with   ? Follow Up   ? Hypothyroidism   Pituitary microadenoma    Date of Service: 02/02/2022    Holly Maldonado is a 61 y.o. female. DOB: 03-Apr-1961   MRN#: [id number]    HPI:  Hypothyroidism: She is on levothyroxine 88 mcg daily.  Dose was reduced earlier 2022.  She has depression.  She is compliant with medicine and takes it on an empty stomach in morning.  ?  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 He has been followed for the prolactin. She appears to have had regression of pituitary adenoma in imagine 11/2017.  No adenoma was seen in 2020 on MRI.  Last prolactin level was normal.  She denies breast complaints, discharge.    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    Patient has a history of depression and has been  Lithium, Prozac, Lamictal, Cymbalta in the past.    High PTH: She has past history of nephrolithiasis and she has medullary sponge kidney disease.  Her first kidney stone was 22 years back.  She had ultrasound in 2022 that showed kidney stones.  She was started on chlorthalidone by nephrologist in 2022 as her 24-hour urine calcium was high.  Recently PTH was checked and it was found to be elevated.  She has maybe 1 serving of calcium rich food on a daily basis.  Otherwise diet is low in calcium.    Review of Systems   Constitutional: Positive for malaise/fatigue. Negative for fever.   Cardiovascular: Negative for chest pain.   Psychiatric/Behavioral: Positive for depression.       Medical History:   Diagnosis Date   ? Adrenal nodule (HCC)     Left   ? Arrhythmia     multiple PVCs from EKG 08/14/12   ? depression    ? Dyslipidemia    ? Endometriosis    ? Esophageal reflux    ? H/O eating disorder    ? Hematuria     microscopic   ? High cholesterol    ? Hypothyroid    ? Medullary sponge kidney    ? Mental disorder    ? Nephrocalcinosis    ? Obstructive sleep apnea 2003   ? Pituitary adenoma (HCC)     2014   ? PTSD (post-traumatic stress disorder)    ? Recurrent UTI    ? Seasonal allergic reaction 2021   ? Sleep apnea    ? Tachycardia      Surgical History:   Procedure Laterality Date   ? HX TUBAL LIGATION  2010   ? PR NEURECTOMY HAMSTRING MUSCLE Left 2018   ? COLONOSCOPY DIAGNOSTIC WITH SPECIMEN COLLECTION BY BRUSHING/ WASHING - FLEXIBLE N/A 12/15/2018    Performed by Veneta Penton, MD at [facility] OR   ? ESOPHAGOGASTRODUODENOSCOPY WITH BIOPSY - FLEXIBLE N/A 05/06/2021    Performed by Buckles, Vinnie Level, MD at [facility] OR   ? DRUG-INDUCED SLEEP ENDOSCOPY WITH DYNAMIC EVALUATION OF VELUM/ PHARYNX/ TONGUE BASE/ AND LARYNX FOR EVALUATION OF SLEEP-DISORDERED BREATHING -?FLEXIBLE -DIAGNOSTIC N/A 06/18/2021    Performed by Lurline Idol, MD at [facility] OR   ? ROBOT ASSISTED LAPAROSCOPIC ADRENALECTOMY Left 07/31/2021    Performed by Willette Cluster, MD  at [facility] OR   ? OPEN IMPLANTATION HYPOGLOSSAL NERVE NEUROSTIMULATOR ARRAY/ PULSE GENERATOR/ DISTAL RESPIRATORY SENSOR ELECTRODE/ ELECTRODE ARRAY Right 10/13/2021    Performed by Lurline Idol, MD at [facility] OR   ? ELECTROCARDIOGRAM     ? HX BLADDER SUSPENSION     ? HX CESAREAN SECTION     ? HX HYSTERECTOMY     ? HX SINUS SURGERY     ? HX TONSILLECTOMY     ? LAPAROSCOPY       Family History   Problem Relation Age of Onset   ? Coronary Artery Disease Father    ? Diabetes Type II Mother    ? Cancer Mother         leukemia   ? Diabetes Mother    ? Heart Attack Mother         Died at 58 from heart attack   ? Coronary Artery Disease Maternal Grandmother    ? Circulatory problem Maternal Grandmother    ? Coronary Artery Disease Maternal Grandfather    ? Heart Attack Maternal Grandfather    ? Heart Surgery Maternal Grandfather    ? Coronary Artery Disease Paternal Grandmother    ? Coronary Artery Disease Paternal Grandfather    ? None Reported Brother    ? None Reported Son    ? None Reported Daughter    ? None Reported Daughter    ? None Reported Daughter    ? None Reported Son    ? Glaucoma Neg Hx    ? Macular Degen Neg Hx    ? Strabismus Neg Hx    ? Retinal Detachment Neg Hx    ? Blindness Neg Hx    ? Cataract Neg Hx    ? Amblyopia Neg Hx      Social History     Socioeconomic History   ? Marital status: Divorced   ? Number of children: 5   Tobacco Use   ? Smoking status: Former     Packs/day: 1.00     Years: 7.00     Additional pack years: 0.00     Total pack years: 7.00     Types: Cigarettes     Quit date: 08/22/1992     Years since quitting: 29.4   ? Smokeless tobacco: Never   Vaping Use   ? Vaping Use: Never used   Substance and Sexual Activity   ? Alcohol use: No   ? Drug use: No   ? Sexual activity: Not Currently     Partners: Male     Birth control/protection: Post-menopausal         Objective:     ? chlorthalidone (HYGROTON) 25 mg tablet TAKE 1/2 TABLET BY MOUTH ONCE DAILY   ? docosahexaenoic acid/epa (FISH OIL PO) Take 3 capsules by mouth daily.   ? ezetimibe (ZETIA) 10 mg tablet Take one tablet by mouth daily.   ? flecainide (TAMBOCOR) 100 mg tablet Take one-half tablet by mouth twice daily.   ? flunisolide 25 mcg (0.025 %) nasal spray Apply one spray to each nostril as directed twice daily.   ? FLUoxetine (PROZAC) 40 mg capsule Take two capsules by mouth daily.   ? ketotifen (ZADITOR) 0.025 % (0.035 %) ophthalmic solution Place one drop into or around eye(s) twice daily as needed.   ? lamoTRIgine (LAMICTAL) 100 mg tablet Take two tablets by mouth daily.   ? levothyroxine (SYNTHROID) 88 mcg tablet Take one tablet by mouth daily 30 minutes  before breakfast.   ? magnesium oxide (MAG-OX) 400 mg (241.3 mg magnesium) tablet Take three tablets by mouth at bedtime daily. 1,200 mg = 3 tablets   ? omeprazole DR (PRILOSEC) 40 mg capsule TAKE 1 CAPSULE BY MOUTH EVERY DAY BEFORE BREAKFAST   ? other medication CITRUS BERGAMOT supplement. 1 tablet in the morning  Indications: for cholesterol   ? rosuvastatin (CRESTOR) 10 mg tablet Take one tablet by mouth daily.   ? TURMERIC PO Take 3 tablets by mouth daily.     Vitals:    02/02/22 1507   PainSc: Zero   Weight: 82.6 kg (182 lb)   Height: 162.6 cm (5' 4)     Body mass index is 31.24 kg/m?Marland Kitchen     Physical Exam  Vitals and nursing note reviewed.   Constitutional:       General: She is not in acute distress.     Appearance: Normal appearance. She is not ill-appearing, toxic-appearing or diaphoretic.   HENT:      Head: Normocephalic and atraumatic.      Nose: Nose normal.   Eyes:      Conjunctiva/sclera: Conjunctivae normal.   Neurological:      Mental Status: She is alert and oriented to person, place, and time.   Psychiatric:         Mood and Affect: Mood normal.               Lab   Thyroid Studies    Lab Results   Component Value Date/Time    TSH 1.52 11/24/2021 11:58 AM    No results found for: Edsel Petrin, [facility]     11/2018 MRI  There is homogeneous enhancement of the pituitary gland, which   demonstrates a somewhat flattened configuration along the sellar floor   without sellar expansion. The posterior pituitary bright spot is normal in   appearance and location. No discrete sellar or suprasellar mass is   identified. The infundibulum, ?optic chiasm, and cavernous sinuses   demonstrate normal signal and morphology. ?   The ventricles and subarachnoid spaces are normal in size and   configuration. There are a few scattered foci of supratentorial white   matter FLAIR hyperintensity, in a bifrontal and subcortical predominant   distribution. There is no midline shift or mass effect. There is no area   of abnormal contrast enhancement. The vascular flow-voids are   unremarkable. Diffusion weighted imaging is not indicative of acute or   recent infarct.       Outside labs:  02/13/2017  TSH 0.37 (reference 0.40-4.5), free T4 1.1 (reference 0.8- 1.6)  Normal CMP, normal CBC  04/20/2018  Cholesterol 276, LDL 195, HDL 79  TSH 12.12    ?  05/23/2015 MRI brain there is relatively unchanged appearance of 6 mm area of non-enhancement noted within posterior aspect of the right lateral aspect of the pituitary gland concerning for underlying pituitary microadenoma  06/22/2017 bone alkaline phosphatase normal  10/10/2017 ACTH at 9:48 AM 16.66 (reference 7.2-63.3)  10/17/2017 prolactin 42.79 (reference 4.79-23.3) normal at 99.17, FSH and LH both normal 72.36 and 31.69 respectively  11/2017 MRI brain 0.5 x 0.4 x 0.7 cm focal area of nodular enhancement along the left temporal convexity extra-axial space 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  Minimal partial empty sella without convincing evidence of pituitary microadenoma    24-hour urine calcium  November 2022: 375  September 2023 219      Assessment and Plan:    Chanti A. Clent Ridges  was seen today for follow up and hypothyroidism.    Diagnoses and all orders for this visit:    Pituitary adenoma (HCC)    Acquired hypothyroidism    Hyperparathyroidism (HCC)        Assessment  ?  Hx Pititary microadenoma / Partial Empty Sella  05/23/2015 MRI brain there is relatively unchanged appearance of 6 mm area of non-enhancement noted within posterior aspect of the right lateral aspect of the pituitary gland concerning for underlying pituitary microadenoma  10/10/2017 ACTH at 9:48 AM 16.66 (reference 7.2-63.3)  10/17/2017 prolactin 42.79 (reference 4.79-23.3) normal at 99.17, FSH and LH both normal 72.36 and 31.69 respectively  11/2017 MRI brain 0.5 x 0.4 x 0.7 cm focal area of nodular enhancement along the left temporal convexity extra-axial space 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  Minimal partial empty sella without convincing evidence of pituitary microadenoma  2020: No microadenoma was seen.    Prolactinoma  Previous imaging demonstrates microadenoma  10/17/2017 prolactin 42.79 (reference 4.79-23.3)  Repeat prolactin level in 6 months.  Last prolactin was okay.    Uncontrolled hypothyroidism  Dx: 1997  On replacement.  TSH is at goal.  Continue levothyroxine 88 mcg daily.    High PTH: She has past history of nephrolithiasis and she has medullary sponge kidney disease.  Her first kidney stone was 22 years back.  She had ultrasound in 2022 that showed kidney stones.  She was started on chlorthalidone by nephrologist in 2022 as her 24-hour urine calcium was high.  Recently PTH was checked and it was found to be elevated.  She has maybe 1 serving of calcium rich food on a daily basis.  Otherwise diet is low in calcium.    I would like to do a 24-hour urine calcium and PTH levels without taking chlorthalidone.  I recommended her to hold off on chlorthalidone for now.  She is planning to do blood test and 24-hour urine test again in 2-3 months for nephrologist.  We will see what the parathyroid hormone level is.    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  ?        40 minutes spent reviewing nephrology records, discussing treatment plan with the patient, documentation.  Electronically signed by Lucrezia Starch, MD 02/02/2022

## 2022-02-03 ENCOUNTER — Encounter: Admit: 2022-02-03 | Discharge: 2022-02-03 | Payer: No Typology Code available for payment source

## 2022-02-04 ENCOUNTER — Encounter: Admit: 2022-02-04 | Discharge: 2022-02-04 | Payer: No Typology Code available for payment source

## 2022-02-04 DIAGNOSIS — K219 Gastro-esophageal reflux disease without esophagitis: Secondary | ICD-10-CM

## 2022-02-04 DIAGNOSIS — N644 Mastodynia: Secondary | ICD-10-CM

## 2022-02-04 MED ORDER — FAMOTIDINE 40 MG PO TAB
40 mg | ORAL_TABLET | Freq: Two times a day (BID) | ORAL | 3 refills | 90.00000 days | Status: AC
Start: 2022-02-04 — End: ?

## 2022-02-08 ENCOUNTER — Encounter: Admit: 2022-02-08 | Discharge: 2022-02-08 | Payer: No Typology Code available for payment source

## 2022-02-08 MED ORDER — LEVOTHYROXINE 88 MCG PO TAB
88 ug | ORAL_TABLET | Freq: Every day | ORAL | 1 refills | 30.00000 days | Status: DC
Start: 2022-02-08 — End: 2022-02-08

## 2022-02-08 MED ORDER — LEVOTHYROXINE 88 MCG PO TAB
88 ug | ORAL_TABLET | Freq: Every day | ORAL | 1 refills | 30.00000 days | Status: AC
Start: 2022-02-08 — End: ?

## 2022-02-08 NOTE — Telephone Encounter
Incoming fax from [facility] requesting medication refill  for Levothyroxine 88 mcg tab.    Medication refilled per protocol

## 2022-02-10 ENCOUNTER — Encounter: Admit: 2022-02-10 | Discharge: 2022-02-10 | Payer: No Typology Code available for payment source

## 2022-02-10 MED ORDER — FLECAINIDE 100 MG PO TAB
50 mg | ORAL_TABLET | Freq: Two times a day (BID) | ORAL | 3 refills | 30.00000 days | Status: AC
Start: 2022-02-10 — End: ?

## 2022-02-11 ENCOUNTER — Encounter: Admit: 2022-02-11 | Discharge: 2022-02-11 | Payer: No Typology Code available for payment source

## 2022-02-11 ENCOUNTER — Encounter: Admit: 2022-02-11 | Discharge: 2022-02-11 | Payer: MEDICARE

## 2022-02-11 ENCOUNTER — Ambulatory Visit: Admit: 2022-02-11 | Discharge: 2022-02-11 | Payer: No Typology Code available for payment source

## 2022-02-11 ENCOUNTER — Ambulatory Visit: Admit: 2022-02-11 | Discharge: 2022-02-11 | Payer: MEDICARE

## 2022-02-11 DIAGNOSIS — G4733 Obstructive sleep apnea (adult) (pediatric): Secondary | ICD-10-CM

## 2022-02-11 DIAGNOSIS — E785 Hyperlipidemia, unspecified: Secondary | ICD-10-CM

## 2022-02-11 DIAGNOSIS — I499 Cardiac arrhythmia, unspecified: Secondary | ICD-10-CM

## 2022-02-11 DIAGNOSIS — R000 Tachycardia, unspecified: Secondary | ICD-10-CM

## 2022-02-11 DIAGNOSIS — Z8659 Personal history of other mental and behavioral disorders: Secondary | ICD-10-CM

## 2022-02-11 DIAGNOSIS — N390 Urinary tract infection, site not specified: Secondary | ICD-10-CM

## 2022-02-11 DIAGNOSIS — D352 Benign neoplasm of pituitary gland: Secondary | ICD-10-CM

## 2022-02-11 DIAGNOSIS — E278 Other specified disorders of adrenal gland: Secondary | ICD-10-CM

## 2022-02-11 DIAGNOSIS — F99 Mental disorder, not otherwise specified: Secondary | ICD-10-CM

## 2022-02-11 DIAGNOSIS — G4730 Sleep apnea, unspecified: Secondary | ICD-10-CM

## 2022-02-11 DIAGNOSIS — F32A Depressed: Secondary | ICD-10-CM

## 2022-02-11 DIAGNOSIS — K219 Gastro-esophageal reflux disease without esophagitis: Secondary | ICD-10-CM

## 2022-02-11 DIAGNOSIS — F4310 Post-traumatic stress disorder, unspecified: Secondary | ICD-10-CM

## 2022-02-11 DIAGNOSIS — J302 Other seasonal allergic rhinitis: Secondary | ICD-10-CM

## 2022-02-11 DIAGNOSIS — E7800 Pure hypercholesterolemia, unspecified: Secondary | ICD-10-CM

## 2022-02-11 DIAGNOSIS — Q615 Medullary cystic kidney: Secondary | ICD-10-CM

## 2022-02-11 DIAGNOSIS — E039 Hypothyroidism, unspecified: Secondary | ICD-10-CM

## 2022-02-11 DIAGNOSIS — N644 Mastodynia: Secondary | ICD-10-CM

## 2022-02-11 NOTE — Progress Notes
Name: Holly Maldonado          MRN: [id number]      DOB: 08-03-1960      AGE: 61 y.o.   DATE OF SERVICE: 02/11/2022       Reason for Visit:  Heme/Onc Care      Holly Maldonado is a 61 y.o. female.       DIAGNOSIS:  Bilateral breast pain     History of Present Illness     Ms. Holly Maldonado is a female who presented to the [facility] on 02/11/2022 at age 59 for bilateral breast pain.  She reports intermittent breast pain for about 2 months, specifically nipple burning.  She reports that she had been using an Estrogen patch and the dose was increased in early 2023.  She felt that this was contributing to some pain at the time and she decided to discontinue in spring 2023.  She has recently been monitored closely for thyroid and parathyroid issues, she had and adrenal gland removed in 07/2021.  She had screening mammogram in 07/2021 in [place] which was benign.  She denies having prior breast issues or procedures.    BREAST IMAGING:  Mammogram:    - Screening mammogram 07/21/2021 [name]) revealed scattered fibroglandular densities.  No concerning masses or calcifications were seen.       REPRODUCTIVE HEALTH:  Age at first Menarche:  89  Age at First Live Birth:  24  Age at Menopause:  49 (HRT x 5 years)  Gravida:  6  Para: 5  Breastfeeding:  yes    PERTINENT PMH:  Chronic Bulimia, hyperparathyroidism, CAD, GERD, Sleep apnea, h/p pituitary adenoma  FAMILY HISTORY:  Maternal Aunt- Breast cancer.  No family history of ovarian or prostate cancer.   REFERRED BY:   [name]     Review of Systems   Constitutional: Negative for appetite change, chills, fatigue and fever.   HENT: Positive for sinus pressure and tinnitus. Negative for congestion, hearing loss, postnasal drip and rhinorrhea.    Eyes: Negative for pain, discharge and itching.   Respiratory: Negative for cough, chest tightness and shortness of breath.    Cardiovascular: Negative for chest pain and palpitations.   Gastrointestinal: Negative for abdominal distention, abdominal pain, diarrhea, nausea and vomiting.   Endocrine: Positive for heat intolerance.   Genitourinary: Negative for difficulty urinating, frequency, pelvic pain and vaginal bleeding.   Musculoskeletal: Positive for arthralgias. Negative for back pain, joint swelling and neck pain.   Skin: Negative for rash.   Neurological: Negative for dizziness, weakness, light-headedness and headaches.   Hematological: Does not bruise/bleed easily.   Psychiatric/Behavioral: Positive for decreased concentration. Negative for sleep disturbance. The patient is nervous/anxious.      Allergies   Allergen Reactions   ? Buspirone ANXIETY   ? Fetzima [Levomilnacipran] SEE COMMENTS     Per pt very depressed, couldn't stop crying.   ? Ipratropium SEE COMMENTS     Blurry vision with ipratropium nasal spray   ? Lurasidone SEE COMMENTS   ? Trazodone HEADACHE     The following medical/surgical/family/social history and the list of medications are current, as of 02/11/2022    Medical History:   Diagnosis Date   ? Adrenal nodule (HCC)     Left   ? Arrhythmia     multiple PVCs from EKG 08/14/12   ? depression    ? Dyslipidemia    ? Endometriosis    ? Esophageal  reflux    ? H/O eating disorder    ? Hematuria     microscopic   ? High cholesterol    ? Hypothyroid    ? Medullary sponge kidney    ? Mental disorder    ? Nephrocalcinosis    ? Obstructive sleep apnea 2003   ? Pituitary adenoma (HCC)     2014   ? PTSD (post-traumatic stress disorder)    ? Recurrent UTI    ? Seasonal allergic reaction 2021   ? Sleep apnea    ? Tachycardia      Surgical History:   Procedure Laterality Date   ? HX TUBAL LIGATION  2010   ? PR NEURECTOMY HAMSTRING MUSCLE Left 2018   ? COLONOSCOPY DIAGNOSTIC WITH SPECIMEN COLLECTION BY BRUSHING/ WASHING - FLEXIBLE N/A 12/15/2018    Performed by Veneta Penton, MD at [facility] OR   ? ESOPHAGOGASTRODUODENOSCOPY WITH BIOPSY - FLEXIBLE N/A 05/06/2021    Performed by Buckles, Vinnie Level, MD at [facility] OR   ? DRUG-INDUCED SLEEP ENDOSCOPY WITH DYNAMIC EVALUATION OF VELUM/ PHARYNX/ TONGUE BASE/ AND LARYNX FOR EVALUATION OF SLEEP-DISORDERED BREATHING -?FLEXIBLE -DIAGNOSTIC N/A 06/18/2021    Performed by Lurline Idol, MD at [facility] OR   ? ROBOT ASSISTED LAPAROSCOPIC ADRENALECTOMY Left 07/31/2021    Performed by Willette Cluster, MD at [facility] OR   ? OPEN IMPLANTATION HYPOGLOSSAL NERVE NEUROSTIMULATOR ARRAY/ PULSE GENERATOR/ DISTAL RESPIRATORY SENSOR ELECTRODE/ ELECTRODE ARRAY Right 10/13/2021    Performed by Lurline Idol, MD at [facility] OR   ? ELECTROCARDIOGRAM     ? HX BLADDER SUSPENSION     ? HX CESAREAN SECTION     ? HX HYSTERECTOMY     ? HX SINUS SURGERY     ? HX TONSILLECTOMY     ? LAPAROSCOPY       Family History   Problem Relation Age of Onset   ? Coronary Artery Disease Father    ? Diabetes Type II Mother    ? Cancer Mother         leukemia   ? Diabetes Mother    ? Heart Attack Mother         Died at 73 from heart attack   ? Coronary Artery Disease Maternal Grandmother    ? Circulatory problem Maternal Grandmother    ? Coronary Artery Disease Maternal Grandfather    ? Heart Attack Maternal Grandfather    ? Heart Surgery Maternal Grandfather    ? Coronary Artery Disease Paternal Grandmother    ? Coronary Artery Disease Paternal Grandfather    ? None Reported Brother    ? None Reported Son    ? None Reported Daughter    ? None Reported Daughter    ? None Reported Daughter    ? None Reported Son    ? Glaucoma Neg Hx    ? Macular Degen Neg Hx    ? Strabismus Neg Hx    ? Retinal Detachment Neg Hx    ? Blindness Neg Hx    ? Cataract Neg Hx    ? Amblyopia Neg Hx      Social History     Socioeconomic History   ? Marital status: Divorced   ? Number of children: 5   Tobacco Use   ? Smoking status: Former     Packs/day: 1.00     Years: 7.00     Additional pack years: 0.00     Total pack years:  7.00     Types: Cigarettes     Quit date: 08/22/1992     Years since quitting: 29.4   ? Smokeless tobacco: Never Vaping Use   ? Vaping Use: Never used   Substance and Sexual Activity   ? Alcohol use: No   ? Drug use: No   ? Sexual activity: Not Currently     Partners: Male     Birth control/protection: Post-menopausal         Objective:         ? chlorthalidone (HYGROTON) 25 mg tablet TAKE 1/2 TABLET BY MOUTH ONCE DAILY   ? docosahexaenoic acid/epa (FISH OIL PO) Take 3 capsules by mouth daily.   ? ezetimibe (ZETIA) 10 mg tablet Take one tablet by mouth daily.   ? famotidine (PEPCID) 40 mg tablet Take one tablet by mouth twice daily.   ? flecainide (TAMBOCOR) 100 mg tablet Take one-half tablet by mouth twice daily.   ? flunisolide 25 mcg (0.025 %) nasal spray Apply one spray to each nostril as directed twice daily.   ? FLUoxetine (PROZAC) 40 mg capsule Take two capsules by mouth daily.   ? ketotifen (ZADITOR) 0.025 % (0.035 %) ophthalmic solution Place one drop into or around eye(s) twice daily as needed.   ? lamoTRIgine (LAMICTAL) 100 mg tablet Take two tablets by mouth daily.   ? levothyroxine (SYNTHROID) 88 mcg tablet Take one tablet by mouth daily 30 minutes before breakfast.   ? magnesium oxide (MAG-OX) 400 mg (241.3 mg magnesium) tablet Take three tablets by mouth at bedtime daily. 1,200 mg = 3 tablets   ? omeprazole DR (PRILOSEC) 40 mg capsule TAKE 1 CAPSULE BY MOUTH EVERY DAY BEFORE BREAKFAST   ? other medication CITRUS BERGAMOT supplement. 1 tablet in the morning  Indications: for cholesterol   ? rosuvastatin (CRESTOR) 10 mg tablet Take one tablet by mouth daily.   ? TURMERIC PO Take 3 tablets by mouth daily.     Vitals:    02/11/22 0854   BP: 127/71   BP Source: Arm, Left Upper   Pulse: 71   Temp: 36.6 ?C (97.9 ?F)   SpO2: 98%   TempSrc: Temporal   PainSc: Three   Weight: 82.6 kg (182 lb 3.2 oz)   Height: 162.6 cm (5' 4)     Body mass index is 31.27 kg/m?Marland Kitchen     Pain Score: Three  Pain Loc: Shoulder (joint pain)    Fatigue Scale: 0-None    Pain Addressed:  N/A    Patient Evaluated for a Clinical Trial: No treatment clinical trial available for this patient.     [place] Cooperative Oncology Group performance status is 0, Fully active, able to carry on all pre-disease performance without restriction.Marland Kitchen     Physical Exam  Vitals reviewed.   Chest:              RIGHT BREAST EXAM:  Breast:  No palpable breast masses. No skin, nipple, or areolar change.  Skin Erythema:  No  Attachment of Overlying Skin:  No  Peau d' orange:  No  Chest Wall Attachment:  No  Nipple Inversion:  No  Nipple Discharge: No    LEFT BREAST EXAM:  Breast: No palpable breast masses. No skin, nipple, or areolar change.  Skin Erythema:  No  Attachment of Overlying Skin:  No  Peau d' orange:  No  Chest Wall Attachment: No  Nipple Inversion:  No  Nipple Discharge:  No  RIGHT NODAL BASIN EXAM:  Axillary:  negative  Infraclavicular:  negative  Supraclavicular:  negative    LEFT NODAL BASIN EXAM:  Axillary:  negative  Infraclavicular: negative  Supraclavicular:  negative      Constitutional: No acute distress.  HEENT:  Head: Normocephalic and atraumatic.  Eyes: No discharge. No scleral icterus.  Pulmonary/Chest: No respiratory distress.   Neurological: Alert and oriented to person, place and time. No cranial nerve deficit.  Skin: Warm and dry. No rash noted. No erythema. No pallor.  Psychiatric: Normal mood and affect. Behavior is normal. Judgement and thought content normal.       Assessment and Plan:    DIAGNOSIS:  Bilateral breast pain         Ms. Wydra was reassured that breast pain is benign and self limited, the causes of which are not well understood.  We discussed that focal breast pain can be caused by benign breast cysts.  These are benign findings within the breast that typically will wax and wane over time and often resolve without intervention.  They can be aspirated if large and painful but have a high rate of recurrence following this procedure.  Focal breast pain can also be caused by a fibroadenoma.  We discussed that fibroadenoma is a benign growth that does not increase the risk of malignancy or cause malignancy so does not require any intervention.  However, if it is bothersome and can be surgically removed.   If no physical cause is identified on imaging and/or the pain is global, it is often related to hormone fluctuations.  She was given a list of possible remedies that are helpful for approximately 30% of women.    There are two possible outcomes from having targeted breast imaging: 1) benign findings with recommendation for interval follow up versus return to routine screening; 2) suspicious findings for which biopsy will be recommended and scheduled.   I will contact her with the results when they are available.  She was given ample time to ask questions all of which were answered to her satisfaction. She was given direct contact information and encouraged to call or utilize MyChart with any interval questions or concerns.     1. Breast imaging today  2. Return to clinic pending findings    Nigel Berthold, PA-C        ADDENDUM    Lm advising of benign findings.  Recommend screening mammogram in 1 year.  She was encouraged to call back with any questions or concerns.     Nigel Berthold, PA-C        EXAM:  MAMMO DIAGNOSTIC BIL/TOMO  US BREAST TARGET BILAT 02/11/22 10:19 AM     INDICATION:   61 year old patient presenting with bilateral diffuse nonfocal breast pain radiating to the subareolar region bilaterally.  ?  COMPARISON:  Compared to:   02/27/2014 MAMMO SCREEN EXTERNAL IMAGING  06/15/2019 MAMMO SCREEN EXTERNAL IMAGING  07/14/2020 MAMMO SCREEN BILAT/TOMO/CAD  07/21/2021 MAMMO SCREEN EXTERNAL IMAGING   ?  BREAST COMPOSITION:   The breasts have scattered areas of fibroglandular density.  ?  ?  TECHNIQUE:  Mammogram: 2-D and 3-D (digital tomosynthesis) mammographic images were obtained bilaterally.  ?  Ultrasound:Targeted right and left handheld breast ultrasound was performed by a sonographer.  ?  ?  FINDINGS:  ?  Diagnostic Mammogram: No suspicious abnormality is seen in either breast.  No significant change from prior.  ?  Diagnostic Ultrasound: Targeted ultrasound was performed of the subareolar  region bilaterally in the setting of diffuse breast pain radiating to the subareolar region.  Symmetric bilateral dilated ducts with scattered avascular debris are seen in both breasts.  There is no suspicious sonographic abnormality in either breast.  ?  IMPRESSION:  ?  No mammographic evidence of malignancy in either breast.  ?  Symmetric bilateral dilated ducts are seen in the subareolar regions bilaterally.  No suspicious sonographic abnormality.  ?  ASSESSMENT:  Left: 2 - Benign  Right: 2 - Benign  Overall: 2 - Benign     RECOMMENDATION:  Return to Annual Screening Mammogram is recommended for both breasts.   ?  Electronically signed and approved by: Ollen Barges, MD 02/11/2022 1:40 PM

## 2022-02-22 ENCOUNTER — Encounter: Admit: 2022-02-22 | Discharge: 2022-02-22 | Payer: No Typology Code available for payment source

## 2022-02-24 NOTE — Progress Notes
Holly Maldonado is a plesant 61 y.o. female.    Here for sleep apnea follow up.Known pt to Dr. Clarene Critchley  Here with [name] from [place] today. She has not used inspire since Saturday. She reports that she started feeling pain on both sides of her tongue similar to when you get a canker sore. It is worse on the right side. When she inspected her tongue she did not see anything. She reports feeling better since not using it. She was not feeling a big difference in subjective symptoms.            [place] 22/24    Objective:         ? chlorthalidone (HYGROTON) 25 mg tablet TAKE 1/2 TABLET BY MOUTH ONCE DAILY   ? docosahexaenoic acid/epa (FISH OIL PO) Take 3 capsules by mouth daily.   ? ezetimibe (ZETIA) 10 mg tablet Take one tablet by mouth daily.   ? famotidine (PEPCID) 40 mg tablet Take one tablet by mouth twice daily.   ? flecainide (TAMBOCOR) 100 mg tablet Take one-half tablet by mouth twice daily.   ? flunisolide 25 mcg (0.025 %) nasal spray Apply one spray to each nostril as directed twice daily.   ? FLUoxetine (PROZAC) 40 mg capsule Take two capsules by mouth daily.   ? ketotifen (ZADITOR) 0.025 % (0.035 %) ophthalmic solution Place one drop into or around eye(s) twice daily as needed.   ? lamoTRIgine (LAMICTAL) 100 mg tablet Take two tablets by mouth daily.   ? levothyroxine (SYNTHROID) 88 mcg tablet Take one tablet by mouth daily 30 minutes before breakfast.   ? magnesium oxide (MAG-OX) 400 mg (241.3 mg magnesium) tablet Take three tablets by mouth at bedtime daily. 1,200 mg = 3 tablets   ? omeprazole DR (PRILOSEC) 40 mg capsule TAKE 1 CAPSULE BY MOUTH EVERY DAY BEFORE BREAKFAST   ? other medication CITRUS BERGAMOT supplement. 1 tablet in the morning  Indications: for cholesterol   ? rosuvastatin (CRESTOR) 10 mg tablet Take one tablet by mouth daily.   ? TURMERIC PO Take 3 tablets by mouth daily.     Vitals:    02/26/22 0853   BP: 110/66   Pulse: 69   Temp: 36.3 ?C (97.4 ?F)   Resp: 18   SpO2: 96%   TempSrc: Temporal   PainSc: Zero   Weight: 81.6 kg (180 lb)   Height: 162.6 cm (5' 4)     Body mass index is 30.9 kg/m?Marland Kitchen     Physical Exam  Constitutional:       Appearance: Normal appearance. She is normal weight.   HENT:      Head: Normocephalic.   Eyes:      Pupils: Pupils are equal, round, and reactive to light.   Pulmonary:      Effort: Pulmonary effort is normal.   Neurological:      General: No focal deficit present.      Mental Status: She is alert and oriented to person, place, and time. Mental status is at baseline.   Psychiatric:         Mood and Affect: Mood normal.         Behavior: Behavior normal.         Thought Content: Thought content normal.         Judgment: Judgment normal.              Reviewed Inspire download.        Assessment and Plan:  Problem   Osa (Obstructive Sleep Apnea)    07/2016 Moderate-severe, AHI 22.9 Mob1 in REM sleep when OSAS most severe    HGNS implantation: 10/13/2321  HGNS activation: 11/27/2021    Pre-titration 01/22/22: using nightly, averaging 8 hours nightly, still waking at 3am but back to sleep within 5 minutes, napping daily 15-45 minutes         OSA (obstructive sleep apnea)  Reviewed Inspire remote download. 26/30 nights used, usage 4 hours or greater 87%. Average nightly usage 8 hours with 0 pauses per night. Incoming amplitude 2.5.v on + - + configuration    Functional threshold 2.5  Sense waveform checked- within normal limits     Settings after today's visit  Amplitude range: 2.2v-3.2v  Final Amplitude: 2.3v  Pause time: 15 minutes  Delay time: 30 minutes  Stop time: 9 hours  She will begin to use inspire two steps below for comfort. After exploring other settings her lift and protrusion does not look very different. We discussed getting a mouth guard for the lower teeth to help with tongue rubbing and discomfort. She will go up 1 step every 1 to 2 weeks as tolerated.                   Follow up in 6 weeks time.    Total face time 20 minutes.  Additional 10 minutes spent coordinating visit, documentation/review of prior testing or other data and placing corresponding orders

## 2022-02-26 ENCOUNTER — Encounter: Admit: 2022-02-26 | Discharge: 2022-02-26 | Payer: No Typology Code available for payment source

## 2022-02-26 ENCOUNTER — Ambulatory Visit: Admit: 2022-02-26 | Discharge: 2022-02-26 | Payer: MEDICARE

## 2022-02-26 ENCOUNTER — Ambulatory Visit: Admit: 2022-02-26 | Discharge: 2022-02-26 | Payer: No Typology Code available for payment source

## 2022-02-26 DIAGNOSIS — E278 Other specified disorders of adrenal gland: Secondary | ICD-10-CM

## 2022-02-26 DIAGNOSIS — Q615 Medullary cystic kidney: Secondary | ICD-10-CM

## 2022-02-26 DIAGNOSIS — F32A Depressed: Secondary | ICD-10-CM

## 2022-02-26 DIAGNOSIS — G4730 Sleep apnea, unspecified: Secondary | ICD-10-CM

## 2022-02-26 DIAGNOSIS — I499 Cardiac arrhythmia, unspecified: Secondary | ICD-10-CM

## 2022-02-26 DIAGNOSIS — J302 Other seasonal allergic rhinitis: Secondary | ICD-10-CM

## 2022-02-26 DIAGNOSIS — E785 Hyperlipidemia, unspecified: Secondary | ICD-10-CM

## 2022-02-26 DIAGNOSIS — E210 Primary hyperparathyroidism: Secondary | ICD-10-CM

## 2022-02-26 DIAGNOSIS — F99 Mental disorder, not otherwise specified: Secondary | ICD-10-CM

## 2022-02-26 DIAGNOSIS — N390 Urinary tract infection, site not specified: Secondary | ICD-10-CM

## 2022-02-26 DIAGNOSIS — Z8659 Personal history of other mental and behavioral disorders: Secondary | ICD-10-CM

## 2022-02-26 DIAGNOSIS — K219 Gastro-esophageal reflux disease without esophagitis: Secondary | ICD-10-CM

## 2022-02-26 DIAGNOSIS — G4733 Obstructive sleep apnea (adult) (pediatric): Secondary | ICD-10-CM

## 2022-02-26 DIAGNOSIS — E7800 Pure hypercholesterolemia, unspecified: Secondary | ICD-10-CM

## 2022-02-26 DIAGNOSIS — D352 Benign neoplasm of pituitary gland: Secondary | ICD-10-CM

## 2022-02-26 DIAGNOSIS — E039 Hypothyroidism, unspecified: Secondary | ICD-10-CM

## 2022-02-26 DIAGNOSIS — N200 Calculus of kidney: Secondary | ICD-10-CM

## 2022-02-26 DIAGNOSIS — F4310 Post-traumatic stress disorder, unspecified: Secondary | ICD-10-CM

## 2022-02-26 DIAGNOSIS — R000 Tachycardia, unspecified: Secondary | ICD-10-CM

## 2022-02-26 DIAGNOSIS — E213 Hyperparathyroidism, unspecified: Secondary | ICD-10-CM

## 2022-02-26 LAB — BASIC METABOLIC PANEL
ANION GAP: 9 (ref 3–12)
BLD UREA NITROGEN: 17 mg/dL (ref 7–25)
CALCIUM: 9.9 mg/dL (ref 8.5–10.6)
CHLORIDE: 105 MMOL/L (ref 98–110)
CO2: 25 MMOL/L (ref 21–30)
CREATININE: 0.9 mg/dL (ref 0.4–1.00)
EGFR: >60 mL/min (ref >60)
GLUCOSE,PANEL: 88 mg/dL (ref 70–100)
POTASSIUM: 4.1 MMOL/L (ref 3.5–5.1)
SODIUM: 139 MMOL/L (ref 137–147)

## 2022-02-26 NOTE — Assessment & Plan Note
Reviewed Inspire remote download. 26/30 nights used, usage 4 hours or greater 87%. Average nightly usage 8 hours with 0 pauses per night. Incoming amplitude 2.5.v on + - + configuration    Functional threshold 2.5  Sense waveform checked- within normal limits     Settings after today's visit  Amplitude range: 2.2v-3.2v  Final Amplitude: 2.3v  Pause time: 15 minutes  Delay time: 30 minutes  Stop time: 9 hours  She will begin to use inspire two steps below for comfort. After exploring other settings her lift and protrusion does not look very different. We discussed getting a mouth guard for the lower teeth to help with tongue rubbing and discomfort. She will go up 1 step every 1 to 2 weeks as tolerated.

## 2022-03-09 ENCOUNTER — Encounter: Admit: 2022-03-09 | Discharge: 2022-03-09 | Payer: No Typology Code available for payment source

## 2022-03-09 DIAGNOSIS — E039 Hypothyroidism, unspecified: Secondary | ICD-10-CM

## 2022-03-09 DIAGNOSIS — E119 Type 2 diabetes mellitus without complications: Secondary | ICD-10-CM

## 2022-03-09 DIAGNOSIS — E7800 Pure hypercholesterolemia, unspecified: Secondary | ICD-10-CM

## 2022-03-09 DIAGNOSIS — E559 Vitamin D deficiency, unspecified: Secondary | ICD-10-CM

## 2022-03-11 ENCOUNTER — Encounter: Admit: 2022-03-11 | Discharge: 2022-03-11 | Payer: No Typology Code available for payment source

## 2022-03-15 ENCOUNTER — Encounter: Admit: 2022-03-15 | Discharge: 2022-03-15 | Payer: No Typology Code available for payment source

## 2022-03-15 NOTE — Progress Notes
Order for 24 hour urine faxed to [facility] [phone] to have kit mailed to pt home address. Confirmation rec'd.

## 2022-03-16 ENCOUNTER — Encounter: Admit: 2022-03-16 | Discharge: 2022-03-16 | Payer: No Typology Code available for payment source

## 2022-03-17 ENCOUNTER — Encounter: Admit: 2022-03-17 | Discharge: 2022-03-17 | Payer: No Typology Code available for payment source

## 2022-03-17 ENCOUNTER — Ambulatory Visit: Admit: 2022-03-17 | Discharge: 2022-03-17 | Payer: MEDICARE

## 2022-03-17 DIAGNOSIS — E119 Type 2 diabetes mellitus without complications: Secondary | ICD-10-CM

## 2022-03-17 DIAGNOSIS — E213 Hyperparathyroidism, unspecified: Secondary | ICD-10-CM

## 2022-03-17 DIAGNOSIS — E039 Hypothyroidism, unspecified: Secondary | ICD-10-CM

## 2022-03-17 DIAGNOSIS — D352 Benign neoplasm of pituitary gland: Secondary | ICD-10-CM

## 2022-03-17 DIAGNOSIS — N200 Calculus of kidney: Secondary | ICD-10-CM

## 2022-03-17 DIAGNOSIS — E7800 Pure hypercholesterolemia, unspecified: Secondary | ICD-10-CM

## 2022-03-17 DIAGNOSIS — E559 Vitamin D deficiency, unspecified: Secondary | ICD-10-CM

## 2022-03-17 LAB — 25-OH VITAMIN D (D2 + D3): VITAMIN D (25-OH) TOTAL: 41 ng/mL (ref 30–80)

## 2022-03-17 LAB — HEMOGLOBIN A1C: HEMOGLOBIN A1C: 5.5 % (ref 4.0–5.7)

## 2022-03-17 LAB — COMPREHENSIVE METABOLIC PANEL
ALBUMIN: 4.7 g/dL (ref 3.5–5.0)
ALK PHOSPHATASE: 86 U/L (ref 25–110)
ALT: 12 U/L (ref 7–56)
ANION GAP: 10 (ref 3–12)
AST: 22 U/L (ref 7–40)
CHLORIDE: 105 MMOL/L (ref 98–110)
CO2: 25 MMOL/L (ref 21–30)
EGFR: >60 mL/min (ref >60)
SODIUM: 140 MMOL/L (ref 137–147)

## 2022-03-17 LAB — PROLACTIN: PROLACTIN: 24 ng/mL (ref 3.3–26.7)

## 2022-03-17 LAB — LIPID PROFILE
CHOLESTEROL: 169 mg/dL (ref <200)
LDL: 83 mg/dL (ref <100)
NON HDL CHOLESTEROL: 100 mg/dL (ref 0.3–1.2)
TRIGLYCERIDES: 88 mg/dL (ref <150)

## 2022-03-17 LAB — CREATINE KINASE-CPK: CK TOTAL: 81 U/L (ref >40)

## 2022-03-17 LAB — PHOSPHORUS: PHOSPHORUS: 3.4 mg/dL (ref 2.0–4.5)
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
## 2022-03-18 ENCOUNTER — Encounter: Admit: 2022-03-18 | Discharge: 2022-03-18 | Payer: No Typology Code available for payment source

## 2022-03-18 ENCOUNTER — Ambulatory Visit: Admit: 2022-03-18 | Discharge: 2022-03-19 | Payer: MEDICARE

## 2022-03-18 DIAGNOSIS — F332 Major depressive disorder, recurrent severe without psychotic features: Secondary | ICD-10-CM

## 2022-03-18 DIAGNOSIS — Z789 Other specified health status: Secondary | ICD-10-CM

## 2022-03-18 DIAGNOSIS — F502 Bulimia nervosa: Secondary | ICD-10-CM

## 2022-03-18 DIAGNOSIS — F4312 Post-traumatic stress disorder, chronic: Secondary | ICD-10-CM

## 2022-03-18 DIAGNOSIS — I493 Ventricular premature depolarization: Secondary | ICD-10-CM

## 2022-03-18 MED ORDER — FLUOXETINE 20 MG PO CAP
20 mg | ORAL_CAPSULE | Freq: Every day | ORAL | 2 refills | Status: AC
Start: 2022-03-18 — End: ?

## 2022-03-18 MED ORDER — LAMOTRIGINE 100 MG PO TAB
200 mg | ORAL_TABLET | Freq: Every day | ORAL | 2 refills | Status: AC
Start: 2022-03-18 — End: ?

## 2022-03-18 MED ORDER — FLUOXETINE 40 MG PO CAP
80 mg | ORAL_CAPSULE | Freq: Every day | ORAL | 0 refills | Status: AC
Start: 2022-03-18 — End: ?

## 2022-03-18 NOTE — Patient Instructions
Continue psychotropic medications as prescribed unless changes listed below:   - Increase Prozac/Fluoxetine to 100mg  daily  - Continue Lamictal/Lamotrigine 200mg  daily  - We will contact your cardiologist about the possibility of a medication called Modafinil as a depression augmenting agent that can help with energy also.     Return to clinic in 3 months.  Please call the clinic to reschedule or cancel appointments if somethings changes.     Our clinic is dedicated to making sure your prescriptions are filled in a timely manner during regular business hours (8am-5pm).  If you need a medication refill, please call your pharmacy to request a refill.  Please make sure to request refills at least 72 hours in advance of your last dose.  Your pharmacy will reach out to [name] electronically, which is the preferred method.  Please try to refrain from paging the on-call psychiatrist regarding medication refills (including controlled substances), as you may not receive a refill or a full refill at that time.      For nonemergent concerns, you may reach out to the clinic via MyChart. You should receive a response within 72 business hours from our clinic staff/providers.    [facility] provides its patients with 24/7 care. If you are concerned about an impending psychiatric emergency (i.e. if you have any concerns about risk of harm to yourself, risk of harm to others or feel unsure about your ability to care for yourself), you may reach out to the overnight psychiatrist on-call by calling the main [facility] operator line outside of normal business hours ([phone]). If you are unsure if you are experiencing a psychiatric emergency, please ask the operator to page the on-call psychiatrist for clarification.     If you require an immediate response from a health care professional, please do not call the on-call psychiatrist and call 911 directly.     In the event of a safety concern or suicidal thoughts, call 911 or go to the nearest emergency room. National Suicide Prevention Lifeline (832)778-1820 (Talk). Crisis Text Hotline (text 605-158-6522).     It was good seeing you!   Dr. Raquel Kahiau Schewe, Psychiatry Resident

## 2022-03-23 ENCOUNTER — Encounter: Admit: 2022-03-23 | Discharge: 2022-03-23 | Payer: No Typology Code available for payment source

## 2022-03-24 ENCOUNTER — Encounter: Admit: 2022-03-24 | Discharge: 2022-03-24 | Payer: No Typology Code available for payment source

## 2022-03-24 MED ORDER — NEXLIZET 180-10 MG PO TAB
1 | ORAL_TABLET | Freq: Every day | ORAL | 3 refills | 30.00000 days | Status: AC
Start: 2022-03-24 — End: ?

## 2022-03-26 ENCOUNTER — Encounter: Admit: 2022-03-26 | Discharge: 2022-03-26 | Payer: No Typology Code available for payment source

## 2022-04-08 ENCOUNTER — Encounter: Admit: 2022-04-08 | Discharge: 2022-04-08 | Payer: No Typology Code available for payment source

## 2022-04-15 ENCOUNTER — Encounter: Admit: 2022-04-15 | Discharge: 2022-04-15 | Payer: No Typology Code available for payment source

## 2022-04-15 ENCOUNTER — Ambulatory Visit: Admit: 2022-04-15 | Discharge: 2022-04-15 | Payer: MEDICARE

## 2022-04-15 DIAGNOSIS — G4730 Sleep apnea, unspecified: Secondary | ICD-10-CM

## 2022-04-15 DIAGNOSIS — N390 Urinary tract infection, site not specified: Secondary | ICD-10-CM

## 2022-04-15 DIAGNOSIS — E7800 Pure hypercholesterolemia, unspecified: Secondary | ICD-10-CM

## 2022-04-15 DIAGNOSIS — Z86010 Personal history of colonic polyps: Secondary | ICD-10-CM

## 2022-04-15 DIAGNOSIS — E785 Hyperlipidemia, unspecified: Secondary | ICD-10-CM

## 2022-04-15 DIAGNOSIS — K219 Gastro-esophageal reflux disease without esophagitis: Secondary | ICD-10-CM

## 2022-04-15 DIAGNOSIS — G4733 Obstructive sleep apnea (adult) (pediatric): Secondary | ICD-10-CM

## 2022-04-15 DIAGNOSIS — I499 Cardiac arrhythmia, unspecified: Secondary | ICD-10-CM

## 2022-04-15 DIAGNOSIS — D352 Benign neoplasm of pituitary gland: Secondary | ICD-10-CM

## 2022-04-15 DIAGNOSIS — R110 Nausea: Secondary | ICD-10-CM

## 2022-04-15 DIAGNOSIS — R000 Tachycardia, unspecified: Secondary | ICD-10-CM

## 2022-04-15 DIAGNOSIS — K5901 Slow transit constipation: Secondary | ICD-10-CM

## 2022-04-15 DIAGNOSIS — J302 Other seasonal allergic rhinitis: Secondary | ICD-10-CM

## 2022-04-15 DIAGNOSIS — Q615 Medullary cystic kidney: Secondary | ICD-10-CM

## 2022-04-15 DIAGNOSIS — F99 Mental disorder, not otherwise specified: Secondary | ICD-10-CM

## 2022-04-15 DIAGNOSIS — E039 Hypothyroidism, unspecified: Secondary | ICD-10-CM

## 2022-04-15 DIAGNOSIS — Z8659 Personal history of other mental and behavioral disorders: Secondary | ICD-10-CM

## 2022-04-15 DIAGNOSIS — F4310 Post-traumatic stress disorder, unspecified: Secondary | ICD-10-CM

## 2022-04-15 DIAGNOSIS — F32A Depressed: Secondary | ICD-10-CM

## 2022-04-15 DIAGNOSIS — E278 Other specified disorders of adrenal gland: Secondary | ICD-10-CM

## 2022-04-15 NOTE — Progress Notes
Date of Service: 04/15/2022    Subjective:             Holly Maldonado is a 61 y.o. female.    History of Present Illness  I saw Holly Maldonado in GI clinic today.  She has a history of dysphagia and reflux and nausea.  She underwent EGD in December 2022 this was unremarkable and esophageal biopsies were negative for excess eosinophils.  The patient had good symptomatic response to omeprazole 40 mg daily and her symptoms resolved.  However, she has a history of medullary sponge kidney and her nephrologist would prefer that she not take PPI, so we switched her to Pepcid 40 mg twice daily.  Patient states that this helps but not quite as well as proton pump inhibitor.  She does have some occasional nausea.  She has some mild constipation and bloating and takes MiraLAX every other day which does seem to help.  She had a screening colonoscopy in 2020 which showed a 3 mm tubular adenoma.  7-year surveillance colonoscopy was recommended at that time.       Review of Systems   Constitutional: Negative.    HENT: Negative.     Eyes: Negative.    Respiratory: Negative.     Cardiovascular: Negative.    Gastrointestinal:  Positive for abdominal distention, constipation and nausea.   Endocrine: Negative.    Genitourinary: Negative.    Musculoskeletal: Negative.    Skin: Negative.    Allergic/Immunologic: Negative.    Neurological: Negative.    Hematological: Negative.    Psychiatric/Behavioral: Negative.     A complete review of systems was obtained from the patient and all other systems were reviewed and negative.    Objective:          bempedoic acid-ezetimibe (NEXLIZET) 180-10 mg tablet Take one tablet by mouth daily.    chlorthalidone (HYGROTON) 25 mg tablet TAKE 1/2 TABLET BY MOUTH ONCE DAILY    docosahexaenoic acid/epa (FISH OIL PO) Take 3 capsules by mouth daily.    ezetimibe (ZETIA) 10 mg tablet Take one tablet by mouth daily.    famotidine (PEPCID) 40 mg tablet Take one tablet by mouth twice daily.    flecainide (TAMBOCOR) 100 mg tablet Take one-half tablet by mouth twice daily.    flunisolide 25 mcg (0.025 %) nasal spray Apply one spray to each nostril as directed twice daily.    FLUoxetine (PROZAC) 20 mg capsule Take one capsule by mouth daily. Take one capsule (20mg ) by mouth daily with additional two 40mg  capsules (80mg ) for total daily dose of 100mg .    FLUoxetine (PROZAC) 40 mg capsule Take two capsules by mouth daily. Take two capsules (80mg ) by mouth daily with one 20mg  capsule for total daily dose of 100mg .    ketotifen (ZADITOR) 0.025 % (0.035 %) ophthalmic solution Place one drop into or around eye(s) twice daily as needed.    lamoTRIgine (LAMICTAL) 100 mg tablet Take two tablets by mouth daily.    levothyroxine (SYNTHROID) 88 mcg tablet Take one tablet by mouth daily 30 minutes before breakfast.    magnesium oxide (MAG-OX) 400 mg (241.3 mg magnesium) tablet Take three tablets by mouth at bedtime daily. 1,200 mg = 3 tablets    omeprazole DR (PRILOSEC) 40 mg capsule TAKE 1 CAPSULE BY MOUTH EVERY DAY BEFORE BREAKFAST    other medication CITRUS BERGAMOT supplement. 1 tablet in the morning  Indications: for cholesterol    rosuvastatin (CRESTOR) 10 mg tablet Take one tablet  by mouth daily.    TURMERIC PO Take 3 tablets by mouth daily.     Vitals:    04/15/22 1058   BP: 120/85   BP Source: Arm, Left Upper   Pulse: 67   Temp: 36.9 ?C (98.5 ?F)   TempSrc: Oral   Weight: 81.6 kg (180 lb)   Height: 162.6 cm (5' 4.02)     Body mass index is 30.88 kg/m?Marland Kitchen     Physical Exam  Vital signs and nurses notes reviewed  Patient alert and oriented, appropriate  Lungs clear to auscultation  Heart regular rhythm and rate  Abdomen soft, non-tender, normal bowel sounds  Neuro exam without focal deficits       Assessment and Plan:               Holly Maldonado was seen today for esophageal reflux.    Diagnoses and all orders for this visit:    Gastroesophageal reflux disease without esophagitis    Nausea    Slow transit constipation    History of colon polyp    Holly Maldonado is doing well.  She will continue Pepcid 40 mg twice daily.  She can take over-the-counter antacids as needed if she has breakthrough heartburn or reflux symptoms.  The patient has some Zofran she can take as needed for nausea.  She will be due for surveillance colonoscopy in 2027.  The patient will increase her MiraLAX to daily to see if this helps with constipation and bloating.    Patient Instructions   Continue Pepcid 40 mg twice daily  Can take over the counter antacids as needed  Can take Zofran as needed for nausea - call if you need refill   Colonoscopy in 2027 for surveillance   Increase Miralax to daily     If you have questions or concerns that need to be addressed before your next scheduled office visit, please call my nurse at [phone] or send me a MyChart patient message.    A total of 20 minutes were spent today on this encounter.  This includes preparing for the visit, reviewing medical records, obtaining relevant history, speaking to the patient, interpreting test results, ordering medications and tests and documenting the visit in the electronic health record.

## 2022-04-25 ENCOUNTER — Encounter: Admit: 2022-04-25 | Discharge: 2022-04-25 | Payer: No Typology Code available for payment source

## 2022-04-25 DIAGNOSIS — E213 Hyperparathyroidism, unspecified: Secondary | ICD-10-CM

## 2022-04-26 ENCOUNTER — Encounter: Admit: 2022-04-26 | Discharge: 2022-04-26 | Payer: No Typology Code available for payment source

## 2022-04-26 NOTE — Telephone Encounter
Per Dr. Kirby Funk: please have your labs drawn prior to your upcoming appointment. You can walk in to any [place] lab or we can send the orders to the lab of your choice.    Dianah Field RN

## 2022-04-26 NOTE — Telephone Encounter
-----   Message from Julious Payer, MD sent at 04/25/2022  8:54 PM CST -----  Regarding: [place] reach out to the patient to complete pre-visit labs before their upcoming appointment with me.  Thank you.    Julious Payer, MD

## 2022-04-27 ENCOUNTER — Encounter: Admit: 2022-04-27 | Discharge: 2022-04-27 | Payer: No Typology Code available for payment source

## 2022-04-27 ENCOUNTER — Ambulatory Visit: Admit: 2022-04-27 | Discharge: 2022-04-27 | Payer: MEDICARE

## 2022-04-27 DIAGNOSIS — E213 Hyperparathyroidism, unspecified: Secondary | ICD-10-CM

## 2022-04-27 LAB — PARATHYROID HORMONE: PTH HORMONE: 81 pg/mL — ABNORMAL HIGH (ref 10–65)

## 2022-04-27 LAB — PHOSPHORUS: PHOSPHORUS: 3.2 mg/dL (ref 2.0–4.5)

## 2022-04-28 ENCOUNTER — Encounter: Admit: 2022-04-28 | Discharge: 2022-04-28 | Payer: No Typology Code available for payment source

## 2022-04-28 ENCOUNTER — Ambulatory Visit: Admit: 2022-04-28 | Discharge: 2022-04-28 | Payer: MEDICARE

## 2022-04-28 ENCOUNTER — Ambulatory Visit: Admit: 2022-04-28 | Discharge: 2022-04-28 | Payer: No Typology Code available for payment source

## 2022-04-28 DIAGNOSIS — E7800 Pure hypercholesterolemia, unspecified: Secondary | ICD-10-CM

## 2022-04-28 DIAGNOSIS — E785 Hyperlipidemia, unspecified: Secondary | ICD-10-CM

## 2022-04-28 DIAGNOSIS — E039 Hypothyroidism, unspecified: Secondary | ICD-10-CM

## 2022-04-28 DIAGNOSIS — J302 Other seasonal allergic rhinitis: Secondary | ICD-10-CM

## 2022-04-28 DIAGNOSIS — F32A Depressed: Secondary | ICD-10-CM

## 2022-04-28 DIAGNOSIS — G4730 Sleep apnea, unspecified: Secondary | ICD-10-CM

## 2022-04-28 DIAGNOSIS — K219 Gastro-esophageal reflux disease without esophagitis: Secondary | ICD-10-CM

## 2022-04-28 DIAGNOSIS — R000 Tachycardia, unspecified: Secondary | ICD-10-CM

## 2022-04-28 DIAGNOSIS — Q615 Medullary cystic kidney: Secondary | ICD-10-CM

## 2022-04-28 DIAGNOSIS — E278 Other specified disorders of adrenal gland: Secondary | ICD-10-CM

## 2022-04-28 DIAGNOSIS — N390 Urinary tract infection, site not specified: Secondary | ICD-10-CM

## 2022-04-28 DIAGNOSIS — F4310 Post-traumatic stress disorder, unspecified: Secondary | ICD-10-CM

## 2022-04-28 DIAGNOSIS — F99 Mental disorder, not otherwise specified: Secondary | ICD-10-CM

## 2022-04-28 DIAGNOSIS — N200 Calculus of kidney: Secondary | ICD-10-CM

## 2022-04-28 DIAGNOSIS — G4733 Obstructive sleep apnea (adult) (pediatric): Secondary | ICD-10-CM

## 2022-04-28 DIAGNOSIS — E213 Hyperparathyroidism, unspecified: Secondary | ICD-10-CM

## 2022-04-28 DIAGNOSIS — D352 Benign neoplasm of pituitary gland: Secondary | ICD-10-CM

## 2022-04-28 DIAGNOSIS — I499 Cardiac arrhythmia, unspecified: Secondary | ICD-10-CM

## 2022-04-28 DIAGNOSIS — Z8659 Personal history of other mental and behavioral disorders: Secondary | ICD-10-CM

## 2022-04-28 LAB — IONIZED CALCIUM: IONIZED CALCIUM: 1.3 MMOL/L — ABNORMAL HIGH (ref 1.0–1.3)

## 2022-04-28 MED ORDER — POTASSIUM CITRATE 10 MEQ (1,080 MG) PO TBER
20 meq | ORAL_TABLET | Freq: Two times a day (BID) | ORAL | 3 refills | Status: AC
Start: 2022-04-28 — End: ?

## 2022-04-30 ENCOUNTER — Encounter: Admit: 2022-04-30 | Discharge: 2022-04-30 | Payer: No Typology Code available for payment source

## 2022-04-30 DIAGNOSIS — E210 Primary hyperparathyroidism: Secondary | ICD-10-CM

## 2022-04-30 NOTE — Telephone Encounter
I call the patient to discuss follow up . She will remain on K citrate and Mg O2. Follow up 24 hr urine and appt in 6 months.    Julious Payer, MD

## 2022-05-02 ENCOUNTER — Encounter: Admit: 2022-05-02 | Discharge: 2022-05-02 | Payer: No Typology Code available for payment source

## 2022-05-02 MED ORDER — NEXLIZET 180-10 MG PO TAB
1 | ORAL_TABLET | Freq: Every day | ORAL | 3 refills
Start: 2022-05-02 — End: ?

## 2022-05-04 ENCOUNTER — Encounter: Admit: 2022-05-04 | Discharge: 2022-05-04 | Payer: No Typology Code available for payment source

## 2022-05-04 NOTE — Telephone Encounter
Sent appointment request to scheduling.

## 2022-05-04 NOTE — Telephone Encounter
-----   Message from Jacqlyn Krauss, MD sent at 04/30/2022  3:05 PM CST -----  She needs an appointment with me.

## 2022-05-06 ENCOUNTER — Encounter: Admit: 2022-05-06 | Discharge: 2022-05-06 | Payer: No Typology Code available for payment source

## 2022-05-06 MED ORDER — FLUOXETINE 40 MG PO CAP
80 mg | ORAL_CAPSULE | Freq: Every day | ORAL | 2 refills | Status: AC
Start: 2022-05-06 — End: ?

## 2022-05-07 ENCOUNTER — Encounter: Admit: 2022-05-07 | Discharge: 2022-05-07 | Payer: No Typology Code available for payment source

## 2022-05-19 ENCOUNTER — Encounter: Admit: 2022-05-19 | Discharge: 2022-05-19 | Payer: No Typology Code available for payment source

## 2022-05-19 NOTE — Telephone Encounter
-----  Message from Marlin Canary, RN sent at 05/17/2022  3:51 PM CST -----  Regarding: FW: Possible Med Interaction     ----- Message -----  From: Marlin Canary, RN  Sent: 05/17/2022   3:51 PM CST  To: Marlin Canary, RN  Subject: FW: Possible Med Interaction                       ----- Message -----  From: Annette Stable  Sent: 05/17/2022   8:52 AM CST  To: Cvm Nurse Ep Team B  Subject: Possible Med Interaction                         Possible Med Interaction Please review in [facility]  RF Folder.   Thanks!

## 2022-05-19 NOTE — Telephone Encounter
Pt due to follow up in May 2025. Last OV with [facility] May 2023. Will route to [facility] to see if EKG needed sooner.

## 2022-05-25 ENCOUNTER — Encounter: Admit: 2022-05-25 | Discharge: 2022-05-25 | Payer: No Typology Code available for payment source

## 2022-05-25 ENCOUNTER — Ambulatory Visit: Admit: 2022-05-25 | Discharge: 2022-05-26 | Payer: MEDICARE

## 2022-05-25 ENCOUNTER — Ambulatory Visit: Admit: 2022-05-25 | Discharge: 2022-05-25 | Payer: MEDICARE

## 2022-05-25 DIAGNOSIS — D352 Benign neoplasm of pituitary gland: Secondary | ICD-10-CM

## 2022-05-25 DIAGNOSIS — E278 Other specified disorders of adrenal gland: Secondary | ICD-10-CM

## 2022-05-25 DIAGNOSIS — J302 Other seasonal allergic rhinitis: Secondary | ICD-10-CM

## 2022-05-25 DIAGNOSIS — E785 Hyperlipidemia, unspecified: Secondary | ICD-10-CM

## 2022-05-25 DIAGNOSIS — F32A Depressed: Secondary | ICD-10-CM

## 2022-05-25 DIAGNOSIS — E7800 Pure hypercholesterolemia, unspecified: Secondary | ICD-10-CM

## 2022-05-25 DIAGNOSIS — Q615 Medullary cystic kidney: Secondary | ICD-10-CM

## 2022-05-25 DIAGNOSIS — F4310 Post-traumatic stress disorder, unspecified: Secondary | ICD-10-CM

## 2022-05-25 DIAGNOSIS — G4733 Obstructive sleep apnea (adult) (pediatric): Secondary | ICD-10-CM

## 2022-05-25 DIAGNOSIS — E039 Hypothyroidism, unspecified: Secondary | ICD-10-CM

## 2022-05-25 DIAGNOSIS — Z8659 Personal history of other mental and behavioral disorders: Secondary | ICD-10-CM

## 2022-05-25 DIAGNOSIS — G4730 Sleep apnea, unspecified: Secondary | ICD-10-CM

## 2022-05-25 DIAGNOSIS — N390 Urinary tract infection, site not specified: Secondary | ICD-10-CM

## 2022-05-25 DIAGNOSIS — Z789 Other specified health status: Secondary | ICD-10-CM

## 2022-05-25 DIAGNOSIS — I499 Cardiac arrhythmia, unspecified: Secondary | ICD-10-CM

## 2022-05-25 DIAGNOSIS — R000 Tachycardia, unspecified: Secondary | ICD-10-CM

## 2022-05-25 DIAGNOSIS — F99 Mental disorder, not otherwise specified: Secondary | ICD-10-CM

## 2022-05-25 DIAGNOSIS — N200 Calculus of kidney: Secondary | ICD-10-CM

## 2022-05-25 DIAGNOSIS — K219 Gastro-esophageal reflux disease without esophagitis: Secondary | ICD-10-CM

## 2022-05-25 LAB — BASIC METABOLIC PANEL
ANION GAP: 10 g/dL — ABNORMAL LOW (ref 3–12)
BLD UREA NITROGEN: 19 mg/dL — ABNORMAL HIGH (ref 7–25)
CALCIUM: 10 mg/dL (ref 8.5–10.6)
CO2: 26 MMOL/L (ref 21–30)
GLUCOSE,PANEL: 86 mg/dL (ref 70–100)
POTASSIUM: 4.8 MMOL/L — ABNORMAL LOW (ref 3.5–5.1)
SODIUM: 140 MMOL/L — ABNORMAL HIGH (ref 137–147)

## 2022-05-25 NOTE — Progress Notes
Date of Service: 05/25/2022    Subjective:             Holly Maldonado is a 62 y.o. female.    History of Present Illness  Holly Maldonado returns to sleep medicine clinic for follow-up of obstructive sleep apnea treated with hypoglossal nerve stimulator.  Her last visit with me was 01/22/2022.  She had implantation in June 2023, activated 11/27/2021.  She had worked up to 7 steps, was still waking up at 3 AM but falling back asleep within 5 minutes.  She denied any significant tongue discomfort but did notice that some of the neck muscles felt different randomly throughout the day.  She reported that her most tired time of the day was from 11 AM to 2 PM, and that most of her naps were during that time.  She was encouraged to try moving her bedtime up to 9:00 from 10:00 to see if that would help.  She was using her device nightly and was encouraged to continue doing so.  She did not notice significant benefit subjectively, and we felt that the nerve likely needed more healing time prior to the titration study.  It was deferred.    Following that visit, she called to report that she was having some tongue irritation on 02/22/2022.  It felt like a canker sore on the back of her right tongue.  She was seen by nurse practitioner Holly Maldonado on 02/26/2022 who inspected her tongue.  It looked appropriate and she had returned to using her device after stopping for a few days.  She was encouraged to step down 2 steps and consider getting a mouthguard for the lower teeth to help with rubbing and discomfort.  She was encouraged to step up again 1 step every 1 to 2 weeks as tolerated.    Today she returns to clinic and states that her tongue discomfort has returned to some degree.  She notes that it is like canker sores bilaterally on the sides of her tongue.  She notes that these are uncomfortable with movements of the tongue, swallowing, talking, similar to a canker sore.  They are not worsened by use of her inspire per se, although she still has some strange sensations on the right side of the face and neck at times.  He does not limit her ability to speak or move withdrawn.  She has continued to use her device, stepped down a few steps and has come back up to setting of 2.3, step 2.  She still does not endorse significant improvement in her daytime sleepiness but also states they are looking into a parathyroid nodule and testing her for multiple endocrine neoplasias type I.  She has not had a titration study and we discussed that today.    With further talking, she has had at least 2 MSLTs in the past with the last around 2018 she thinks.  Those were both negative for narcolepsy, but did show IH on the last study per her report.  She was started on something for restless legs but nothing else given.  She states that did not help.  She notes that she has an eating disorder and that even when she lost over one hundred pounds she still felt very fatigued.  She has ADHD and was on vyvanse and adderall in the past, perhaps as early as 2015 or so.  She recalls that those medicines did help her to feel less sleepy but they aggravated her issues with  PVCs and so she was taken off.  She follows closely with cardiology.  We discussed these things today as well.      Epworth Sleepiness Scale Score: 21             Objective:         ? chlorthalidone (HYGROTON) 25 mg tablet TAKE 1/2 TABLET BY MOUTH ONCE DAILY   ? docosahexaenoic acid/epa (FISH OIL PO) Take 3 capsules by mouth daily.   ? ezetimibe (ZETIA) 10 mg tablet Take one tablet by mouth daily.   ? famotidine (PEPCID) 40 mg tablet Take one tablet by mouth twice daily.   ? flecainide (TAMBOCOR) 100 mg tablet Take one-half tablet by mouth twice daily.   ? flunisolide 25 mcg (0.025 %) nasal spray Apply one spray to each nostril as directed twice daily.   ? FLUoxetine (PROZAC) 20 mg capsule Take one capsule by mouth daily. Take one capsule (20mg ) by mouth daily with additional two 40mg  capsules (80mg ) for total daily dose of 100mg .   ? FLUoxetine (PROZAC) 40 mg capsule Take two capsules by mouth daily. Take two capsules (80mg ) by mouth daily with one 20mg  capsule for total daily dose of 100mg .   ? ketotifen (ZADITOR) 0.025 % (0.035 %) ophthalmic solution Place one drop into or around eye(s) twice daily as needed.   ? lamoTRIgine (LAMICTAL) 100 mg tablet Take two tablets by mouth daily.   ? levothyroxine (SYNTHROID) 88 mcg tablet Take one tablet by mouth daily 30 minutes before breakfast.   ? magnesium oxide (MAG-OX) 400 mg (241.3 mg magnesium) tablet Take three tablets by mouth at bedtime daily. 1,200 mg = 3 tablets   ? NEXLIZET 180-10 mg tablet TAKE 1 TABLET BY MOUTH EVERY DAY   ? omeprazole DR (PRILOSEC) 40 mg capsule TAKE 1 CAPSULE BY MOUTH EVERY DAY BEFORE BREAKFAST   ? other medication CITRUS BERGAMOT supplement. 1 tablet in the morning  Indications: for cholesterol   ? potassium citrate (UROCIT-K 10) 10 mEq (1,080 mg) tablet Take two tablets by mouth twice daily. Take with food.   ? rosuvastatin (CRESTOR) 10 mg tablet Take one tablet by mouth daily.   ? TURMERIC PO Take 3 tablets by mouth daily.     Vitals:    05/25/22 1107   BP: 132/74   Pulse: 63   Temp: 36.6 ?C (97.9 ?F)   Resp: 20   SpO2: 95%   PainSc: Zero   Weight: 81 kg (178 lb 9.6 oz)   Height: 162.6 cm (5' 4)     Body mass index is 30.66 kg/m?Marland Kitchen     Physical Exam  Constitutional:       Appearance: Normal appearance.   HENT:      Head: Normocephalic and atraumatic.      Mouth/Throat:      Mouth: Mucous membranes are moist.   Cardiovascular:      Rate and Rhythm: Normal rate and regular rhythm.      Pulses: Normal pulses.      Heart sounds: Normal heart sounds.   Pulmonary:      Effort: Pulmonary effort is normal.      Breath sounds: Normal breath sounds.   Abdominal:      General: Bowel sounds are normal.      Palpations: Abdomen is soft.   Neurological:      Mental Status: She is alert.   Psychiatric:         Mood and Affect: Mood normal. Behavior: Behavior  normal.         Thought Content: Thought content normal.         Judgment: Judgment normal.   Tongue with small areas of irritation noted on the lateral edges towards the front bilaterally and a small patch on the left more proximally    Good B anterior protrusion and midline stiffening noted on 2.3v, 2.2v         Assessment and Plan:  62 year old female with moderate to severe OSA, intolerance to CPAP, on HGNS (6/23) with good nightly use but still without significant improvements in daytime somnolence  Problem   Osa (Obstructive Sleep Apnea)    07/2016 Moderate-severe, AHI 22.9 Mob1 in REM sleep when OSAS most severe    HGNS implantation: 10/13/2321  HGNS activation: 11/27/2021    Pre-titration 01/22/22: using nightly, averaging 8 hours nightly, still waking at 3am but back to sleep within 5 minutes, napping daily 15-45 minutes          OSA (obstructive sleep apnea)  Again averaging more than 8 hours a night, and very minimal pauses again, however continues to deny any significant improvement in daytime somnolence  -Continue using at current settings, no changes made today, perhaps over titrated but appears good functional movement today  -Awaiting the results of parathyroid workup, however would like to schedule titration study in the coming months, can step up prior to that if she would like but does not need to  -Continue using inspire device with any sleeping  -Encouraged to visit with the dentist about getting a mouthguard for the lower teeth which should help with the rubbing she is experiencing and irritation around the tongue    Ulyess Blossom, MD  Seen and discussed with Dr. Truddie Crumble.

## 2022-05-26 ENCOUNTER — Encounter: Admit: 2022-05-26 | Discharge: 2022-05-26 | Payer: No Typology Code available for payment source

## 2022-05-26 NOTE — Telephone Encounter
MSLT study requested from [facility]

## 2022-06-02 ENCOUNTER — Ambulatory Visit: Admit: 2022-06-02 | Discharge: 2022-06-02 | Payer: MEDICARE

## 2022-06-02 ENCOUNTER — Encounter: Admit: 2022-06-02 | Discharge: 2022-06-02 | Payer: No Typology Code available for payment source

## 2022-06-02 DIAGNOSIS — E210 Primary hyperparathyroidism: Secondary | ICD-10-CM

## 2022-06-07 ENCOUNTER — Encounter: Admit: 2022-06-07 | Discharge: 2022-06-07 | Payer: No Typology Code available for payment source

## 2022-06-07 ENCOUNTER — Ambulatory Visit: Admit: 2022-06-07 | Discharge: 2022-06-08 | Payer: MEDICARE

## 2022-06-07 DIAGNOSIS — G4733 Obstructive sleep apnea (adult) (pediatric): Secondary | ICD-10-CM

## 2022-06-07 DIAGNOSIS — R000 Tachycardia, unspecified: Secondary | ICD-10-CM

## 2022-06-07 DIAGNOSIS — J302 Other seasonal allergic rhinitis: Secondary | ICD-10-CM

## 2022-06-07 DIAGNOSIS — D352 Benign neoplasm of pituitary gland: Secondary | ICD-10-CM

## 2022-06-07 DIAGNOSIS — I499 Cardiac arrhythmia, unspecified: Secondary | ICD-10-CM

## 2022-06-07 DIAGNOSIS — E785 Hyperlipidemia, unspecified: Secondary | ICD-10-CM

## 2022-06-07 DIAGNOSIS — K219 Gastro-esophageal reflux disease without esophagitis: Secondary | ICD-10-CM

## 2022-06-07 DIAGNOSIS — E039 Hypothyroidism, unspecified: Secondary | ICD-10-CM

## 2022-06-07 DIAGNOSIS — E278 Other specified disorders of adrenal gland: Secondary | ICD-10-CM

## 2022-06-07 DIAGNOSIS — E7800 Pure hypercholesterolemia, unspecified: Secondary | ICD-10-CM

## 2022-06-07 DIAGNOSIS — N390 Urinary tract infection, site not specified: Secondary | ICD-10-CM

## 2022-06-07 DIAGNOSIS — Q615 Medullary cystic kidney: Secondary | ICD-10-CM

## 2022-06-07 DIAGNOSIS — F99 Mental disorder, not otherwise specified: Secondary | ICD-10-CM

## 2022-06-07 DIAGNOSIS — Z8659 Personal history of other mental and behavioral disorders: Secondary | ICD-10-CM

## 2022-06-07 DIAGNOSIS — F4310 Post-traumatic stress disorder, unspecified: Secondary | ICD-10-CM

## 2022-06-07 DIAGNOSIS — G4730 Sleep apnea, unspecified: Secondary | ICD-10-CM

## 2022-06-07 DIAGNOSIS — F32A Depressed: Secondary | ICD-10-CM

## 2022-06-07 DIAGNOSIS — E210 Primary hyperparathyroidism: Secondary | ICD-10-CM

## 2022-06-07 NOTE — Progress Notes
No chief complaint on file.  Pituitary microadenoma    Date of Service: 06/07/2022    Holly Maldonado is a 62 y.o. female. DOB: Nov 05, 1960   MRN#: [id number]    HPI:  Hypothyroidism: She is on levothyroxine 88 mcg daily.  Dose was reduced earlier 2022.  She has depression.  She is compliant with medicine and takes it on an empty stomach in morning.     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 He has been followed for the prolactin. She appears to have had regression of pituitary adenoma in imagine 11/2017.  No adenoma was seen in 2020 on MRI.  Last prolactin level was normal.  She denies breast complaints, discharge.    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    Patient has a history of depression and has been  Lithium, Prozac, Lamictal, Cymbalta in the past.    High PTH: She has past history of nephrolithiasis and she has medullary sponge kidney disease.  Her first kidney stone was 22 years back.  She had ultrasound in 2022 that showed kidney stones.  She was started on chlorthalidone by nephrologist in 2022 as her 24-hour urine calcium was high.  Recently PTH was checked and it was found to be elevated.  She has maybe 1 serving of calcium rich food on a daily basis.  Otherwise diet is low in calcium.  She takes a MV.    Review of Systems   Constitutional:  Positive for malaise/fatigue. Negative for fever.   Cardiovascular:  Negative for chest pain.   Psychiatric/Behavioral:  Positive for depression.        Medical History:   Diagnosis Date    Adrenal nodule (HCC)     Left    Arrhythmia     multiple PVCs from EKG 08/14/12    depression     Dyslipidemia     Endometriosis     Esophageal reflux     H/O eating disorder     Hematuria     microscopic    High cholesterol     Hypothyroid Medullary sponge kidney     Mental disorder     Nephrocalcinosis     Obstructive sleep apnea 2003    Pituitary adenoma (HCC)     2014    PTSD (post-traumatic stress disorder)     Recurrent UTI     Seasonal allergic reaction 2021    Sleep apnea     Tachycardia      Surgical History:   Procedure Laterality Date    HX TUBAL LIGATION  2010    PR NEURECTOMY HAMSTRING MUSCLE Left 2018    COLONOSCOPY DIAGNOSTIC WITH SPECIMEN COLLECTION BY BRUSHING/ WASHING - FLEXIBLE N/A 12/15/2018    Performed by Veneta Penton, MD at [facility] OR    ESOPHAGOGASTRODUODENOSCOPY WITH BIOPSY - FLEXIBLE N/A 05/06/2021    Performed by Buckles, Vinnie Level, MD at [id number] OR    DRUG-INDUCED SLEEP ENDOSCOPY WITH DYNAMIC EVALUATION OF VELUM/ PHARYNX/ TONGUE BASE/ AND LARYNX FOR EVALUATION OF SLEEP-DISORDERED BREATHING - FLEXIBLE -DIAGNOSTIC N/A 06/18/2021    Performed by Lurline Idol, MD at [facility] OR    ROBOT ASSISTED LAPAROSCOPIC ADRENALECTOMY Left 07/31/2021    Performed by Willette Cluster, MD at [facility] OR  OPEN IMPLANTATION HYPOGLOSSAL NERVE NEUROSTIMULATOR ARRAY/ PULSE GENERATOR/ DISTAL RESPIRATORY SENSOR ELECTRODE/ ELECTRODE ARRAY Right 10/13/2021    Performed by Lurline Idol, MD at [facility] OR    ELECTROCARDIOGRAM      HX BLADDER SUSPENSION      HX CESAREAN SECTION      HX HYSTERECTOMY      HX SINUS SURGERY      HX TONSILLECTOMY      LAPAROSCOPY       Family History   Problem Relation Age of Onset    Diabetes Type II Mother     Cancer Mother         leukemia    Diabetes Mother     Heart Attack Mother         Died at 22 from heart attack    Coronary Artery Disease Father     None Reported Brother     None Reported Daughter     None Reported Daughter     None Reported Daughter     None Reported Son     None Reported Son     Coronary Artery Disease Maternal Grandmother     Circulatory problem Maternal Grandmother     Coronary Artery Disease Maternal Grandfather     Heart Attack Maternal Grandfather     Heart Surgery Maternal Grandfather Coronary Artery Disease Paternal Grandmother     Coronary Artery Disease Paternal Grandfather     Cancer-Breast Maternal Aunt 34    Glaucoma Neg Hx     Macular Degen Neg Hx     Strabismus Neg Hx     Retinal Detachment Neg Hx     Blindness Neg Hx     Cataract Neg Hx     Amblyopia Neg Hx      Social History     Socioeconomic History    Marital status: Divorced    Number of children: 5   Tobacco Use    Smoking status: Former     Packs/day: 1.00     Years: 7.00     Additional pack years: 0.00     Total pack years: 7.00     Types: Cigarettes     Quit date: 08/22/1992     Years since quitting: 29.8    Smokeless tobacco: Never   Vaping Use    Vaping Use: Never used   Substance and Sexual Activity    Alcohol use: No    Drug use: No    Sexual activity: Not Currently     Partners: Male     Birth control/protection: Post-menopausal         Objective:      chlorthalidone (HYGROTON) 25 mg tablet TAKE 1/2 TABLET BY MOUTH ONCE DAILY    docosahexaenoic acid/epa (FISH OIL PO) Take 3 capsules by mouth daily.    ezetimibe (ZETIA) 10 mg tablet Take one tablet by mouth daily.    famotidine (PEPCID) 40 mg tablet Take one tablet by mouth twice daily.    flecainide (TAMBOCOR) 100 mg tablet Take one-half tablet by mouth twice daily.    flunisolide 25 mcg (0.025 %) nasal spray Apply one spray to each nostril as directed twice daily.    FLUoxetine (PROZAC) 20 mg capsule Take one capsule by mouth daily. Take one capsule (20mg ) by mouth daily with additional two 40mg  capsules (80mg ) for total daily dose of 100mg .    FLUoxetine (PROZAC) 40 mg capsule Take two capsules by mouth daily. Take two capsules (80mg ) by mouth daily  with one 20mg  capsule for total daily dose of 100mg .    ketotifen (ZADITOR) 0.025 % (0.035 %) ophthalmic solution Place one drop into or around eye(s) twice daily as needed.    lamoTRIgine (LAMICTAL) 100 mg tablet Take two tablets by mouth daily.    levothyroxine (SYNTHROID) 88 mcg tablet Take one tablet by mouth daily 30 minutes before breakfast.    magnesium oxide (MAG-OX) 400 mg (241.3 mg magnesium) tablet Take three tablets by mouth at bedtime daily. 1,200 mg = 3 tablets    NEXLIZET 180-10 mg tablet TAKE 1 TABLET BY MOUTH EVERY DAY    omeprazole DR (PRILOSEC) 40 mg capsule TAKE 1 CAPSULE BY MOUTH EVERY DAY BEFORE BREAKFAST    other medication CITRUS BERGAMOT supplement. 1 tablet in the morning  Indications: for cholesterol    potassium citrate (UROCIT-K 10) 10 mEq (1,080 mg) tablet Take two tablets by mouth twice daily. Take with food.    rosuvastatin (CRESTOR) 10 mg tablet Take one tablet by mouth daily.     There were no vitals filed for this visit.    There is no height or weight on file to calculate BMI.     Physical Exam  Vitals and nursing note reviewed.   Constitutional:       General: She is not in acute distress.     Appearance: Normal appearance. She is not ill-appearing, toxic-appearing or diaphoretic.   HENT:      Head: Normocephalic and atraumatic.      Nose: Nose normal.   Eyes:      Conjunctiva/sclera: Conjunctivae normal.   Neurological:      Mental Status: She is alert and oriented to person, place, and time.   Psychiatric:         Mood and Affect: Mood normal.               Lab   Thyroid Studies    Lab Results   Component Value Date/Time    TSH 1.72 03/17/2022 09:19 AM    No results found for: Edsel Petrin, [facility]     11/2018 MRI  There is homogeneous enhancement of the pituitary gland, which   demonstrates a somewhat flattened configuration along the sellar floor   without sellar expansion. The posterior pituitary bright spot is normal in   appearance and location. No discrete sellar or suprasellar mass is   identified. The infundibulum,  optic chiasm, and cavernous sinuses   demonstrate normal signal and morphology.     The ventricles and subarachnoid spaces are normal in size and   configuration. There are a few scattered foci of supratentorial white   matter FLAIR hyperintensity, in a bifrontal and subcortical predominant   distribution. There is no midline shift or mass effect. There is no area   of abnormal contrast enhancement. The vascular flow-voids are   unremarkable. Diffusion weighted imaging is not indicative of acute or   recent infarct.       Outside labs:  02/13/2017  TSH 0.37 (reference 0.40-4.5), free T4 1.1 (reference 0.8- 1.6)  Normal CMP, normal CBC  04/20/2018  Cholesterol 276, LDL 195, HDL 79  TSH 12.12       05/23/2015 MRI brain there is relatively unchanged appearance of 6 mm area of non-enhancement noted within posterior aspect of the right lateral aspect of the pituitary gland concerning for underlying pituitary microadenoma  06/22/2017 bone alkaline phosphatase normal  10/10/2017 ACTH at 9:48 AM 16.66 (reference 7.2-63.3)  10/17/2017 prolactin 42.79 (reference  4.79-23.3) normal at 99.17, FSH and LH both normal 72.36 and 31.69 respectively  11/2017 MRI brain 0.5 x 0.4 x 0.7 cm focal area of nodular enhancement along the left temporal convexity extra-axial space 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  Minimal partial empty sella without convincing evidence of pituitary microadenoma    24-hour urine calcium  November 2022: 375  September 2023 219      Assessment and Plan:    Diagnoses and all orders for this visit:    Primary hyperparathyroidism (HCC)  -     COMPREHENSIVE METABOLIC PANEL; Future; Expected date: 06/07/2022  -     PARATHYROID HORMONE; Future; Expected date: 06/07/2022  -     IONIZED CALCIUM; Future; Expected date: 06/07/2022  -     AMB REFERRAL TO ENT; Future; Expected date: 06/07/2022    Acquired hypothyroidism    Pituitary adenoma (HCC)        Assessment     Hx Pititary microadenoma / Partial Empty Sella  05/23/2015 MRI brain there is relatively unchanged appearance of 6 mm area of non-enhancement noted within posterior aspect of the right lateral aspect of the pituitary gland concerning for underlying pituitary microadenoma  10/10/2017 ACTH at 9:48 AM 16.66 (reference 7.2-63.3)  10/17/2017 prolactin 42.79 (reference 4.79-23.3) normal at 99.17, FSH and LH both normal 72.36 and 31.69 respectively  11/2017 MRI brain 0.5 x 0.4 x 0.7 cm focal area of nodular enhancement along the left temporal convexity extra-axial space 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  Minimal partial empty sella without convincing evidence of pituitary microadenoma  2020: No microadenoma was seen.    Prolactinoma  Previous imaging demonstrates microadenoma  10/17/2017 prolactin 42.79 (reference 4.79-23.3)  Repeat prolactin level in 6 months.  Last prolactin was okay.    Uncontrolled hypothyroidism  Dx: 1997  On replacement.  TSH is at goal.  Continue levothyroxine 88 mcg daily.    High PTH: She has past history of nephrolithiasis and she has medullary sponge kidney disease.  Her first kidney stone was 22 years back.  She had ultrasound in 2022 that showed kidney stones.  She was started on chlorthalidone by nephrologist in 2022 as her 24-hour urine calcium was high.  I stopped chlorthalidone in 2023 and repeated 24-hour urine calcium.  24-hour urine calcium is at 210.  Ionized calcium little high.    She has seen nephrologist.  Overall, we think she has primary hyperparathyroidism and may benefit from parathyroidectomy.  Will get ENT opinion.  Ultrasound did not show any adenomas.    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           30 minutes spent reviewing nephrology records, discussing treatment plan with the patient, documentation.  Electronically signed by Lucrezia Starch, MD 06/07/2022

## 2022-06-08 ENCOUNTER — Encounter: Admit: 2022-06-08 | Discharge: 2022-06-08 | Payer: No Typology Code available for payment source
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
## 2022-06-10 ENCOUNTER — Encounter: Admit: 2022-06-10 | Discharge: 2022-06-10 | Payer: No Typology Code available for payment source

## 2022-06-10 ENCOUNTER — Ambulatory Visit: Admit: 2022-06-10 | Discharge: 2022-06-11 | Payer: MEDICARE

## 2022-06-10 ENCOUNTER — Ambulatory Visit: Admit: 2022-06-10 | Discharge: 2022-06-10 | Payer: MEDICARE

## 2022-06-10 DIAGNOSIS — I499 Cardiac arrhythmia, unspecified: Secondary | ICD-10-CM

## 2022-06-10 DIAGNOSIS — F502 Bulimia nervosa: Secondary | ICD-10-CM

## 2022-06-10 DIAGNOSIS — E039 Hypothyroidism, unspecified: Secondary | ICD-10-CM

## 2022-06-10 DIAGNOSIS — D352 Benign neoplasm of pituitary gland: Secondary | ICD-10-CM

## 2022-06-10 DIAGNOSIS — F4312 Post-traumatic stress disorder, chronic: Secondary | ICD-10-CM

## 2022-06-10 DIAGNOSIS — E7800 Pure hypercholesterolemia, unspecified: Secondary | ICD-10-CM

## 2022-06-10 DIAGNOSIS — R000 Tachycardia, unspecified: Secondary | ICD-10-CM

## 2022-06-10 DIAGNOSIS — E785 Hyperlipidemia, unspecified: Secondary | ICD-10-CM

## 2022-06-10 DIAGNOSIS — F99 Mental disorder, not otherwise specified: Secondary | ICD-10-CM

## 2022-06-10 DIAGNOSIS — J302 Other seasonal allergic rhinitis: Secondary | ICD-10-CM

## 2022-06-10 DIAGNOSIS — F4310 Post-traumatic stress disorder, unspecified: Secondary | ICD-10-CM

## 2022-06-10 DIAGNOSIS — Z8659 Personal history of other mental and behavioral disorders: Secondary | ICD-10-CM

## 2022-06-10 DIAGNOSIS — K219 Gastro-esophageal reflux disease without esophagitis: Secondary | ICD-10-CM

## 2022-06-10 DIAGNOSIS — Q615 Medullary cystic kidney: Secondary | ICD-10-CM

## 2022-06-10 DIAGNOSIS — E278 Other specified disorders of adrenal gland: Secondary | ICD-10-CM

## 2022-06-10 DIAGNOSIS — N390 Urinary tract infection, site not specified: Secondary | ICD-10-CM

## 2022-06-10 DIAGNOSIS — G4730 Sleep apnea, unspecified: Secondary | ICD-10-CM

## 2022-06-10 DIAGNOSIS — F332 Major depressive disorder, recurrent severe without psychotic features: Secondary | ICD-10-CM

## 2022-06-10 DIAGNOSIS — F32A Depressed: Secondary | ICD-10-CM

## 2022-06-10 DIAGNOSIS — E210 Primary hyperparathyroidism: Secondary | ICD-10-CM

## 2022-06-10 DIAGNOSIS — G4733 Obstructive sleep apnea (adult) (pediatric): Secondary | ICD-10-CM

## 2022-06-10 LAB — COMPREHENSIVE METABOLIC PANEL
ALBUMIN: 4.7 g/dL (ref 3.5–5.0)
ALK PHOSPHATASE: 85 U/L (ref 25–110)
ANION GAP: 10 — AB (ref 3–12)
AST: 29 U/L (ref 7–40)
BLD UREA NITROGEN: 17 mg/dL (ref 7–25)
CALCIUM: 10 mg/dL (ref 8.5–10.6)
CHLORIDE: 103 MMOL/L (ref 98–110)
CO2: 27 MMOL/L (ref 21–30)
CREATININE: 0.8 mg/dL (ref 0.4–1.00)
GLUCOSE,PANEL: 88 mg/dL (ref 70–100)
POTASSIUM: 4.7 MMOL/L (ref 3.5–5.1)
SODIUM: 140 MMOL/L (ref 137–147)
TOTAL BILIRUBIN: 0.4 mg/dL (ref 0.3–1.2)
TOTAL PROTEIN: 7.5 g/dL (ref 6.0–8.0)

## 2022-06-10 LAB — IONIZED CALCIUM: IONIZED CALCIUM: 1.2 MMOL/L (ref 1.0–1.3)

## 2022-06-10 MED ORDER — MODAFINIL 100 MG PO TAB
100 mg | ORAL_TABLET | Freq: Every day | ORAL | 2 refills | Status: AC
Start: 2022-06-10 — End: ?

## 2022-06-10 NOTE — Telephone Encounter
PA for Modafinil '100MG'$  tablets #90 for 90 days submitted via [email or web address]. Awaiting response.    [employer] Electronic PA Form [phone] NCPDP)    Key: [facility]  PA Case ID #: [id number]  Rx #: [id number]

## 2022-06-10 NOTE — Telephone Encounter
Approved through 05/10/23.

## 2022-06-10 NOTE — Progress Notes
ATTESTATION    I personally performed the key portions of the E/M visit, discussed case with resident and concur with resident documentation of history, physical exam, assessment, and treatment plan unless otherwise noted.    Staff name:  Jolayne Panther, DO Date: 06/10/2022

## 2022-06-10 NOTE — Patient Instructions
Continue psychotropic medications as prescribed unless changes listed below:   - Start Modafinil 100mg  daily   - Prozac 100mg  daily  - Lamotrigine/Lamictal 200mg  daily     Return to clinic in 3 months.  Please call the clinic to reschedule or cancel appointments if somethings changes.     Our clinic is dedicated to making sure your prescriptions are filled in a timely manner during regular business hours (8am-5pm).  If you need a medication refill, please call your pharmacy to request a refill.  Please make sure to request refills at least 72 hours in advance of your last dose.  Your pharmacy will reach out to [name] electronically, which is the preferred method.  Please try to refrain from paging the on-call psychiatrist regarding medication refills (including controlled substances), as you may not receive a refill or a full refill at that time.      For nonemergent concerns, you may reach out to the clinic via MyChart. You should receive a response within 72 business hours from our clinic staff/providers.    [facility] provides its patients with 24/7 care. If you are concerned about an impending psychiatric emergency (i.e. if you have any concerns about risk of harm to yourself, risk of harm to others or feel unsure about your ability to care for yourself), you may reach out to the overnight psychiatrist on-call by calling the main  operator line outside of normal business hours ([phone]). If you are unsure if you are experiencing a psychiatric emergency, please ask the operator to page the on-call psychiatrist for clarification.     If you require an immediate response from a health care professional, please do not call the on-call psychiatrist and call 911 directly.     In the event of a safety concern or suicidal thoughts, call 911 or go to the nearest emergency room. National Suicide Prevention Lifeline 604-596-7932 (Talk). Crisis Text Hotline (text 801-555-5265).     It was good seeing you!   Dr. Raquel Raynard Mapps, Psychiatry Resident

## 2022-06-15 ENCOUNTER — Encounter: Admit: 2022-06-15 | Discharge: 2022-06-15 | Payer: No Typology Code available for payment source

## 2022-06-21 ENCOUNTER — Encounter: Admit: 2022-06-21 | Discharge: 2022-06-21 | Payer: No Typology Code available for payment source

## 2022-06-25 ENCOUNTER — Ambulatory Visit: Admit: 2022-06-25 | Discharge: 2022-06-25 | Payer: MEDICARE

## 2022-06-25 ENCOUNTER — Encounter: Admit: 2022-06-25 | Discharge: 2022-06-25 | Payer: No Typology Code available for payment source

## 2022-06-25 DIAGNOSIS — G4733 Obstructive sleep apnea (adult) (pediatric): Secondary | ICD-10-CM

## 2022-06-25 DIAGNOSIS — Z789 Other specified health status: Secondary | ICD-10-CM

## 2022-06-30 ENCOUNTER — Encounter: Admit: 2022-06-30 | Discharge: 2022-06-30 | Payer: No Typology Code available for payment source

## 2022-06-30 NOTE — Telephone Encounter
Patient called. She saw on mychart order was cancelled for HST. Chart reviewed. Discussed from RN's view HST looks complete however results not available yet. Will route to Sleep Lab to ensure no issues. Patient voiced understanding and agrees. She has appt 07/23/22.

## 2022-07-02 ENCOUNTER — Encounter: Admit: 2022-07-02 | Discharge: 2022-07-02 | Payer: No Typology Code available for payment source

## 2022-07-02 DIAGNOSIS — G4733 Obstructive sleep apnea (adult) (pediatric): Secondary | ICD-10-CM

## 2022-07-05 ENCOUNTER — Encounter: Admit: 2022-07-05 | Discharge: 2022-07-05 | Payer: No Typology Code available for payment source

## 2022-07-12 ENCOUNTER — Encounter: Admit: 2022-07-12 | Discharge: 2022-07-12 | Payer: No Typology Code available for payment source

## 2022-07-12 MED ORDER — POTASSIUM CITRATE 10 MEQ (1,080 MG) PO TBER
20 meq | ORAL_TABLET | Freq: Two times a day (BID) | ORAL | 2 refills | Status: AC
Start: 2022-07-12 — End: ?

## 2022-07-15 ENCOUNTER — Encounter: Admit: 2022-07-15 | Discharge: 2022-07-15 | Payer: No Typology Code available for payment source

## 2022-07-15 NOTE — Telephone Encounter
Contacted pt to bring inspire remote for upcoming appointment.Patient will bring  inspire remote.

## 2022-07-16 ENCOUNTER — Encounter: Admit: 2022-07-16 | Discharge: 2022-07-16 | Payer: No Typology Code available for payment source

## 2022-07-16 ENCOUNTER — Ambulatory Visit: Admit: 2022-07-16 | Discharge: 2022-07-17 | Payer: MEDICARE

## 2022-07-16 DIAGNOSIS — K219 Gastro-esophageal reflux disease without esophagitis: Secondary | ICD-10-CM

## 2022-07-16 DIAGNOSIS — E7800 Pure hypercholesterolemia, unspecified: Secondary | ICD-10-CM

## 2022-07-16 DIAGNOSIS — E039 Hypothyroidism, unspecified: Secondary | ICD-10-CM

## 2022-07-16 DIAGNOSIS — I499 Cardiac arrhythmia, unspecified: Secondary | ICD-10-CM

## 2022-07-16 DIAGNOSIS — G4733 Obstructive sleep apnea (adult) (pediatric): Secondary | ICD-10-CM

## 2022-07-16 DIAGNOSIS — J302 Other seasonal allergic rhinitis: Secondary | ICD-10-CM

## 2022-07-16 DIAGNOSIS — Q615 Medullary cystic kidney: Secondary | ICD-10-CM

## 2022-07-16 DIAGNOSIS — E278 Other specified disorders of adrenal gland: Secondary | ICD-10-CM

## 2022-07-16 DIAGNOSIS — G4730 Sleep apnea, unspecified: Secondary | ICD-10-CM

## 2022-07-16 DIAGNOSIS — Z789 Other specified health status: Secondary | ICD-10-CM

## 2022-07-16 DIAGNOSIS — R000 Tachycardia, unspecified: Secondary | ICD-10-CM

## 2022-07-16 DIAGNOSIS — N390 Urinary tract infection, site not specified: Secondary | ICD-10-CM

## 2022-07-16 DIAGNOSIS — F4310 Post-traumatic stress disorder, unspecified: Secondary | ICD-10-CM

## 2022-07-16 DIAGNOSIS — Z8659 Personal history of other mental and behavioral disorders: Secondary | ICD-10-CM

## 2022-07-16 DIAGNOSIS — D352 Benign neoplasm of pituitary gland: Secondary | ICD-10-CM

## 2022-07-16 DIAGNOSIS — F32A Depressed: Secondary | ICD-10-CM

## 2022-07-16 DIAGNOSIS — F99 Mental disorder, not otherwise specified: Secondary | ICD-10-CM

## 2022-07-16 DIAGNOSIS — E785 Hyperlipidemia, unspecified: Secondary | ICD-10-CM

## 2022-07-16 NOTE — Progress Notes
Date of Service: 07/16/2022    Subjective:             Holly Maldonado is a 62 y.o. female.    History of Present Illness  Holly Maldonado returns to sleep medicine clinic for follow-up on her obstructive sleep apnea treated with hypoglossal nerve stimulator.  As you recall, she is 62 years old and has history of moderate to severe obstructive sleep apnea worse with REM and supine sleep.  She also has multiple psychiatric comorbidities with PTSD and major depression on multiple medications.  She does have a lengthy sleep time through the night and also reports significant sleepiness.  Her last visit was 05/25/2022.  She reported some pain in the tongue that was getting better, so we continued with therapy.  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  We discussed possibly adding something if we found that her device was working well to treat her apnea.  Unfortunately, home sleep apnea test showed residual apnea in the moderate to severe category.  There did not seem to be a significant positional variation, did appear somewhat worse in rem sleep.  She continues to report some discomfort in her tongue and is also started biting her cheeks at night.  Given all of this, she was changed configuration off minus off today with significant voltage reduction as well.  She did find it comfortable and these settings appeared to have good protrusion but also good midline and posterior depression.  We are going to try the settings for some time before any further testing.               Objective:          docosahexaenoic acid/epa (FISH OIL PO) Take 3 capsules by mouth daily.    ezetimibe (ZETIA) 10 mg tablet Take one tablet by mouth daily.    famotidine (PEPCID) 40 mg tablet Take one tablet by mouth twice daily.    flecainide (TAMBOCOR) 100 mg tablet Take one-half tablet by mouth twice daily.    FLUoxetine (PROZAC) 20 mg capsule Take one capsule by mouth daily. Take one capsule (20mg ) by mouth daily with additional two 40mg  capsules (80mg ) for total daily dose of 100mg .    FLUoxetine (PROZAC) 40 mg capsule Take two capsules by mouth daily. Take two capsules (80mg ) by mouth daily with one 20mg  capsule for total daily dose of 100mg .    lamoTRIgine (LAMICTAL) 100 mg tablet Take two tablets by mouth daily.    levothyroxine (SYNTHROID) 88 mcg tablet Take one tablet by mouth daily 30 minutes before breakfast.    magnesium oxide (MAG-OX) 400 mg (241.3 mg magnesium) tablet Take three tablets by mouth at bedtime daily. 1,200 mg = 3 tablets    other medication CITRUS BERGAMOT supplement. 1 tablet in the morning  Indications: for cholesterol    potassium citrate (UROCIT-K 10) 10 mEq (1,080 mg) tablet TAKE TWO TABLETS BY MOUTH TWICE DAILY. TAKE WITH FOOD.    rosuvastatin (CRESTOR) 10 mg tablet Take one tablet by mouth daily.     Vitals:    07/16/22 1104   BP: 117/72   Pulse: 76   Temp: 36.6 ?C (97.8 ?F)   Resp: 18   SpO2: 98%   PainSc: Zero   Weight: 82.1 kg (181 lb)   Height: 162.6 cm (5' 4)     Body mass index is 31.07 kg/m?Marland Kitchen     Physical Exam  Constitutional:  Appearance: Normal appearance.   HENT:      Mouth/Throat:      Mouth: Mucous membranes are moist.      Pharynx: Oropharynx is clear.   Cardiovascular:      Rate and Rhythm: Normal rate and regular rhythm.      Pulses: Normal pulses.      Heart sounds: Normal heart sounds.   Pulmonary:      Effort: Pulmonary effort is normal.      Breath sounds: Normal breath sounds.   Abdominal:      General: Bowel sounds are normal.      Palpations: Abdomen is soft.   Neurological:      Mental Status: She is alert.   Psychiatric:         Mood and Affect: Mood normal.         Behavior: Behavior normal.         Thought Content: Thought content normal.         Judgment: Judgment normal.       Good protrusion and improved posterior tongue depression on 0/-/0       Assessment and Plan:  62 year old female with moderate to severe OSA, status post hypoglossal nerve stimulator with unfortunately complicated by tongue discomfort, still residual apnea moderate to severe on baseline configuration    Problem   Osa (Obstructive Sleep Apnea)    07/2016 Moderate-severe, AHI 22.9 Mob1 in REM sleep when OSAS most severe    HGNS implantation: 10/13/2321  HGNS activation: 11/27/2021    Pre-titration 01/22/22: using nightly, averaging 8 hours nightly, still waking at 3am but back to sleep within 5 minutes, napping daily 15-45 minutes          OSA (obstructive sleep apnea)  Had been delaying titration study due to tongue discomfort and still with persistent issues  -Change configuration today to 0/-/0 and reduced voltage to 1.4 V, hopefully this will improve her sensation and usage  -To continue usage and begin stepping up again, will begin titration process once tongue discomfort has completely resolved  -Her residual sleepiness may be related to her psychiatric disorders or medications, but she does not think medications have impacted much, will assess sleepiness more fully once better control of sleep apnea accomplished    Settings after today's visit  Amplitude range: 1.2v-2.0v  Final Amplitude: 1.4v   Pause time: 15 minutes  Delay time: 30 minutes  Stop time: 10 hours  Pulse width: 90  Rate: 33  Electrodes: 0/-/0      Return to clinic in 2 months  Holly Blossom, MD  Seen and discussed with Dr. Andria Meuse

## 2022-07-16 NOTE — Assessment & Plan Note
Had been delaying titration study due to tongue discomfort and still with persistent issues  -Change configuration today to 0/-/0 and reduced voltage to 1.4 V, hopefully this will improve her sensation and usage  -To continue usage and begin stepping up again, will begin titration process once tongue discomfort has completely resolved  -Her residual sleepiness may be related to her psychiatric disorders or medications, but she does not think medications have impacted much, will assess sleepiness more fully once better control of sleep apnea accomplished    Settings after today's visit  Amplitude range: 1.2v-2.0v  Final Amplitude: 1.4v   Pause time: 15 minutes  Delay time: 30 minutes  Stop time: 10 hours  Pulse width: 90  Rate: 33  Electrodes: 0/-/0

## 2022-07-18 ENCOUNTER — Encounter: Admit: 2022-07-18 | Discharge: 2022-07-18 | Payer: No Typology Code available for payment source

## 2022-07-18 ENCOUNTER — Ambulatory Visit: Admit: 2022-07-18 | Discharge: 2022-07-19 | Payer: MEDICARE

## 2022-07-18 DIAGNOSIS — E7800 Pure hypercholesterolemia, unspecified: Secondary | ICD-10-CM

## 2022-07-18 DIAGNOSIS — Z8659 Personal history of other mental and behavioral disorders: Secondary | ICD-10-CM

## 2022-07-18 DIAGNOSIS — R000 Tachycardia, unspecified: Secondary | ICD-10-CM

## 2022-07-18 DIAGNOSIS — F32A Depressed: Secondary | ICD-10-CM

## 2022-07-18 DIAGNOSIS — D352 Benign neoplasm of pituitary gland: Secondary | ICD-10-CM

## 2022-07-18 DIAGNOSIS — G4730 Sleep apnea, unspecified: Secondary | ICD-10-CM

## 2022-07-18 DIAGNOSIS — F4310 Post-traumatic stress disorder, unspecified: Secondary | ICD-10-CM

## 2022-07-18 DIAGNOSIS — G4733 Obstructive sleep apnea (adult) (pediatric): Secondary | ICD-10-CM

## 2022-07-18 DIAGNOSIS — F99 Mental disorder, not otherwise specified: Secondary | ICD-10-CM

## 2022-07-18 DIAGNOSIS — I499 Cardiac arrhythmia, unspecified: Secondary | ICD-10-CM

## 2022-07-18 DIAGNOSIS — E785 Hyperlipidemia, unspecified: Secondary | ICD-10-CM

## 2022-07-18 DIAGNOSIS — E039 Hypothyroidism, unspecified: Secondary | ICD-10-CM

## 2022-07-18 DIAGNOSIS — J302 Other seasonal allergic rhinitis: Secondary | ICD-10-CM

## 2022-07-18 DIAGNOSIS — N390 Urinary tract infection, site not specified: Secondary | ICD-10-CM

## 2022-07-18 DIAGNOSIS — Q615 Medullary cystic kidney: Secondary | ICD-10-CM

## 2022-07-18 DIAGNOSIS — K219 Gastro-esophageal reflux disease without esophagitis: Secondary | ICD-10-CM

## 2022-07-18 DIAGNOSIS — E278 Other specified disorders of adrenal gland: Secondary | ICD-10-CM

## 2022-07-18 MED ORDER — NYSTATIN 100,000 UNIT/ML PO SUSP
500000 [IU] | Freq: Four times a day (QID) | ORAL | 0 refills | 15.00000 days | Status: AC
Start: 2022-07-18 — End: ?

## 2022-07-18 NOTE — Progress Notes
Holly Maldonado is a 62 y.o. female.    Chief Complaint:  Chief Complaint   Patient presents with    Other     Pt claims her tongue has white spots all over it, and it's painful every time she eats. Symptoms has been going on for 2 weeks now.        History of Present Illness:  HPI  62 year old female presenting with complaints of white spots and pain to her tongue.  She states that it is more painful especially when she eats or is talking.  Symptoms have been worsening over the last 10 to 14 days.  She has had thrush previously after using a steroid inhaler.  She was concerned that she had developed this again.  She denied having any fever or chills.    Review of Systems:  Review of Systems   Constitutional:  Negative for chills and fever.   HENT:  Positive for mouth sores. Negative for congestion.    Respiratory:  Negative for cough and shortness of breath.    Allergic/Immunologic: Negative for immunocompromised state.   Neurological:  Negative for headaches.       Allergies:  Allergies   Allergen Reactions    Buspirone ANXIETY    Fetzima [Levomilnacipran] SEE COMMENTS     Per pt very depressed, couldn't stop crying.    Ipratropium SEE COMMENTS     Blurry vision with ipratropium nasal spray    Lurasidone SEE COMMENTS    Trazodone HEADACHE       Past Medical History:  Medical History:   Diagnosis Date    Adrenal nodule (HCC)     Left    Arrhythmia     multiple PVCs from EKG 08/14/12    depression     Dyslipidemia     Endometriosis     Esophageal reflux     H/O eating disorder     Hematuria     microscopic    High cholesterol     Hypothyroid     Medullary sponge kidney     Mental disorder     Nephrocalcinosis     Obstructive sleep apnea 2003    Pituitary adenoma (HCC)     2014    PTSD (post-traumatic stress disorder)     Recurrent UTI     Seasonal allergic reaction 2021    Sleep apnea     Tachycardia        Past Surgical History:  Surgical History:   Procedure Laterality Date    HX TUBAL LIGATION  2010    PR NEURECTOMY HAMSTRING MUSCLE Left 2018    COLONOSCOPY DIAGNOSTIC WITH SPECIMEN COLLECTION BY BRUSHING/ WASHING - FLEXIBLE N/A 12/15/2018    Performed by Veneta Penton, MD at [facility] OR    ESOPHAGOGASTRODUODENOSCOPY WITH BIOPSY - FLEXIBLE N/A 05/06/2021    Performed by Buckles, Vinnie Level, MD at [facility] OR    DRUG-INDUCED SLEEP ENDOSCOPY WITH DYNAMIC EVALUATION OF VELUM/ PHARYNX/ TONGUE BASE/ AND LARYNX FOR EVALUATION OF SLEEP-DISORDERED BREATHING - FLEXIBLE -DIAGNOSTIC N/A 06/18/2021    Performed by Lurline Idol, MD at [facility] OR    ROBOT ASSISTED LAPAROSCOPIC ADRENALECTOMY Left 07/31/2021    Performed by Willette Cluster, MD at [facility] OR    OPEN IMPLANTATION HYPOGLOSSAL NERVE NEUROSTIMULATOR ARRAY/ PULSE GENERATOR/ DISTAL RESPIRATORY SENSOR ELECTRODE/ ELECTRODE ARRAY Right 10/13/2021    Performed by Lurline Idol, MD at [facility] OR    ELECTROCARDIOGRAM      HX  BLADDER SUSPENSION      HX CESAREAN SECTION      HX HYSTERECTOMY      HX SINUS SURGERY      HX TONSILLECTOMY      LAPAROSCOPY         Pertinent medical/surgical history reviewed  Medical History:   Diagnosis Date    Adrenal nodule (HCC)     Left    Arrhythmia     multiple PVCs from EKG 08/14/12    depression     Dyslipidemia     Endometriosis     Esophageal reflux     H/O eating disorder     Hematuria     microscopic    High cholesterol     Hypothyroid     Medullary sponge kidney     Mental disorder     Nephrocalcinosis     Obstructive sleep apnea 2003    Pituitary adenoma (HCC)     2014    PTSD (post-traumatic stress disorder)     Recurrent UTI     Seasonal allergic reaction 2021    Sleep apnea     Tachycardia      Surgical History:   Procedure Laterality Date    HX TUBAL LIGATION  2010    PR NEURECTOMY HAMSTRING MUSCLE Left 2018    COLONOSCOPY DIAGNOSTIC WITH SPECIMEN COLLECTION BY BRUSHING/ WASHING - FLEXIBLE N/A 12/15/2018    Performed by Veneta Penton, MD at [facility] OR    ESOPHAGOGASTRODUODENOSCOPY WITH BIOPSY - FLEXIBLE N/A 05/06/2021    Performed by Buckles, Vinnie Level, MD at [facility] OR    DRUG-INDUCED SLEEP ENDOSCOPY WITH DYNAMIC EVALUATION OF VELUM/ PHARYNX/ TONGUE BASE/ AND LARYNX FOR EVALUATION OF SLEEP-DISORDERED BREATHING - FLEXIBLE -DIAGNOSTIC N/A 06/18/2021    Performed by Lurline Idol, MD at [facility] OR    ROBOT ASSISTED LAPAROSCOPIC ADRENALECTOMY Left 07/31/2021    Performed by Willette Cluster, MD at [facility] OR    OPEN IMPLANTATION HYPOGLOSSAL NERVE NEUROSTIMULATOR ARRAY/ PULSE GENERATOR/ DISTAL RESPIRATORY SENSOR ELECTRODE/ ELECTRODE ARRAY Right 10/13/2021    Performed by Lurline Idol, MD at [facility] OR    ELECTROCARDIOGRAM      HX BLADDER SUSPENSION      HX CESAREAN SECTION      HX HYSTERECTOMY      HX SINUS SURGERY      HX TONSILLECTOMY      LAPAROSCOPY         History obtained from patient.    Social History:  Social History     Tobacco Use    Smoking status: Former     Current packs/day: 0.00     Average packs/day: 1 pack/day for 7.0 years (7.0 ttl pk-yrs)     Types: Cigarettes     Start date: 08/22/1985     Quit date: 08/22/1992     Years since quitting: 29.9    Smokeless tobacco: Never   Vaping Use    Vaping status: Never Used   Substance Use Topics    Alcohol use: No    Drug use: No     Social History     Substance and Sexual Activity   Drug Use No             Family History:  Family History   Problem Relation Age of Onset    Diabetes Type II Mother     Cancer Mother         leukemia    Diabetes Mother  Heart Attack Mother         Died at 24 from heart attack    Coronary Artery Disease Father     None Reported Brother     None Reported Daughter     None Reported Daughter     None Reported Daughter     None Reported Son     None Reported Son     Coronary Artery Disease Maternal Grandmother     Circulatory problem Maternal Grandmother     Coronary Artery Disease Maternal Grandfather     Heart Attack Maternal Grandfather     Heart Surgery Maternal Grandfather     Coronary Artery Disease Paternal Grandmother     Coronary Artery Disease Paternal Grandfather     Cancer-Breast Maternal Aunt 16    Glaucoma Neg Hx     Macular Degen Neg Hx     Strabismus Neg Hx     Retinal Detachment Neg Hx     Blindness Neg Hx     Cataract Neg Hx     Amblyopia Neg Hx           docosahexaenoic acid/epa (FISH OIL PO) Take 3 capsules by mouth daily.    ezetimibe (ZETIA) 10 mg tablet Take one tablet by mouth daily.    famotidine (PEPCID) 40 mg tablet Take one tablet by mouth twice daily.    flecainide (TAMBOCOR) 100 mg tablet Take one-half tablet by mouth twice daily.    FLUoxetine (PROZAC) 20 mg capsule Take one capsule by mouth daily. Take one capsule (20mg ) by mouth daily with additional two 40mg  capsules (80mg ) for total daily dose of 100mg .    FLUoxetine (PROZAC) 40 mg capsule Take two capsules by mouth daily. Take two capsules (80mg ) by mouth daily with one 20mg  capsule for total daily dose of 100mg .    lamoTRIgine (LAMICTAL) 100 mg tablet Take two tablets by mouth daily.    levothyroxine (SYNTHROID) 88 mcg tablet Take one tablet by mouth daily 30 minutes before breakfast.    magnesium oxide (MAG-OX) 400 mg (241.3 mg magnesium) tablet Take three tablets by mouth at bedtime daily. 1,200 mg = 3 tablets    nystatin (MYCOSTATIN) 100,000 units/mL oral suspension Take 5 mL by mouth four times daily for 10 days.    other medication CITRUS BERGAMOT supplement. 1 tablet in the morning  Indications: for cholesterol    potassium citrate (UROCIT-K 10) 10 mEq (1,080 mg) tablet TAKE TWO TABLETS BY MOUTH TWICE DAILY. TAKE WITH FOOD.    rosuvastatin (CRESTOR) 10 mg tablet Take one tablet by mouth daily.       Vitals:  Vitals:    07/18/22 0843   BP: 116/74   BP Source: Arm, Left Upper   Pulse: 78   Temp: 36.9 ?C (98.5 ?F)   Resp: 20   SpO2: 98%   TempSrc: Temporal   PainSc: Zero   Weight: 81.9 kg (180 lb 9.6 oz)   Height: 162.6 cm (5' 4)        Physical Exam:  Physical Exam  Vitals and nursing note reviewed.   Constitutional:       General: She is not in acute distress. Appearance: Normal appearance. She is not toxic-appearing.   HENT:      Head: Normocephalic and atraumatic.      Mouth/Throat:      Mouth: Mucous membranes are moist.      Tongue: Lesions (White plaques on the tongue with irritation) present.  Pharynx: No oropharyngeal exudate or posterior oropharyngeal erythema.        Comments:  White plaques on her tongue with some erythema around them  Skin:     General: Skin is warm and dry.      Capillary Refill: Capillary refill takes less than 2 seconds.   Neurological:      Mental Status: She is alert.         Procedures    Laboratory Results:   No results found for this or any previous visit (from the past 336 hour(s)).    Radiology Interpretation:      EKG:    Facility Administered Meds:               Clinical Impression:  Holly Maldonado is a 62 y.o. female presenting with concern for possible thrush.  She has sores on her tongue and white plaques so she was concerned that this might be thrush.  She has had a previously.  She had been using a mouthguard in the last few weeks to work along with her inspire device for sleep apnea.  She was concerned that maybe the mouthguard had contributed to her developing thrush.  She feels like there is some irritation that starting to go into her throat.  Otherwise she denies sore throat.  On exam it looks like it could be thrush.  Will prescribe nystatin swish and swallow and have her follow-up with clinic if not improving or having worsening symptoms.      Disposition/Follow up       1. Thrush, oral  nystatin (MYCOSTATIN) 100,000 units/mL oral suspension          Medications:  Encounter Medications   Medications    nystatin (MYCOSTATIN) 100,000 units/mL oral suspension     Sig: Take 5 mL by mouth four times daily for 10 days.     Dispense:  200 mL     Refill:  0         Procedure Notes:      Patient Instructions:    Patient Instructions   Use the nystatin antifungal medicine to clear the white plaques and thrush infection in your mouth.  Make sure to continue to use the medicine for at least 48 hours after your symptoms have cleared to fully treat the infection.    Check back with primary care or be seen again if still not having any improvement after 2 to 3 days of using the medicine.    If having new or worsening symptoms you could be reevaluated as well.    Please check MyChart for your results which may take up to 72 hours.   We will send a MyChart results message but attempt to call you if you still need to sign up for MyChart.   Information to sign up to MyChart is on your After Visit Summary (AVS)  The MyChart help number is [phone], and the nurse triage number is [phone].       Candida Infection: Camelia Phenes is a fungal infection in the mouth and throat. Thrush doesn't usually affect healthy adults. It's more common in babies and people with a weak immune system. It's also more likely if you take antibiotics. Ginette Pitman is normally not contagious.   Understanding fungus in the mouth and throat  Your mouth and throat normally contain millions of tiny organisms. These include bacteria and yeasts. Many of these don't cause any problems. In fact, they may help fight disease.  Yeasts are a type of fungus. A type of yeast called Candida normally lives on the membranes of your mouth and throat. It also lives in the digestive tract and on your skin. Usually, this yeast grows only in small amounts and is harmless. But in some cases, Candida can grow out of control and cause thrush. Ginette Pitman is related to other kinds of Candida infections that can occur at other parts of the body. Thrush refers to an infection of only the mouth and throat.   What causes thrush?  Ginette Pitman happens when something lets too much Candida grow inside your mouth and throat. Certain things that change the normal balance of organisms in the mouth can lead to thrush. One example is antibiotic medicine. This medicine may kill some of the normal bacteria in your mouth. Candida can then grow freely. People on antibiotics have an increased risk for thrush.   You have a higher risk for thrush if you:  Wear dentures  Are getting chemotherapy or radiation therapy  Have diabetes  Have a transplanted organ  Use corticosteroids, including inhaled corticosteroids for lung disease  Have a weak immune system, such as from HIV infection or AIDS  Are an older adult  Symptoms of thrush  Symptoms of thrush can include:  A dry, cottony feeling in your mouth  Cracking at the corners of the mouth  Loss of taste  Pain while eating or swallowing  White patches on the tongue and around the sides of the mouth  Diagnosing thrush  Your healthcare provider will ask about your medical history and your symptoms. They will look closely at your mouth and throat. White or red patches will be found and may be scraped with a tongue depressor. A sample may be looked at under a microscope or sent to a lab to test. Most cases are confirmed just by their appearance; testing can sometimes help to confirm thrush.   If you have thrush, you may also have esophageal candidiasis. This is more common in people who have AIDS or a weak immune system for another reason. Your healthcare provider may diagnose this based on your symptoms and may check for this condition with an upper endoscopy. This is a procedure to examine the digestive tract using a tube with a light and a camera. During this procedure, a tissue sample may be taken to test.   Treatment for thrush  Thrush is usually treated with antifungal medicine. For mild cases, the medicine is often applied directly in your mouth and throat. This may be in the form of a ?swish and swallow? medicine or an antifungal lozenge to suck on and dissolve in your mouth.   In more extensive cases, or if you have a weakened immune system, you may instead be treated with an antifungal pill. This can be a stronger treatment than a swish and swallow or lozenge antifungal. Or you may need medicine through an IV (intravenous) line. These treatments depend on how severe your infection is and what other health conditions you have.   If you are at high risk for thrush, you may need to keep taking oral antifungal medicine. This is to help prevent thrush in the future.   What happens if you don?t get treated for thrush?  If untreated, the Candida may make it difficult to eat or drink. Or it can spread to the esophagus and, rarely, to other parts your body.   Preventing thrush  You may be able to help prevent some  cases of thrush. Make sure to:  Practice good oral hygiene.  Clean your dentures regularly as instructed. Make sure they fit you correctly.  After using a corticosteroid inhaler, rinse out your mouth with water or mouthwash.  Take antibiotics only when clearly needed. Follow your healthcare provider's specific instructions.  Get treated for health problems that increase your risk for thrush, such as diabetes or HIV.  When to call the healthcare provider  Call your healthcare provider right away if you have any of these:  Cottony feeling in your mouth  Loss of taste  Pain while eating or swallowing  White patches or plaques on your tongue or inside your mouth  [employer] last reviewed this educational content on 02/07/2021  ? 2000-2023 [employer], [place]. All rights reserved. This information is not intended as a substitute for professional medical care. Always follow your healthcare professional's instructions.                  Bonna Gains, MD

## 2022-07-19 ENCOUNTER — Encounter: Admit: 2022-07-19 | Discharge: 2022-07-19 | Payer: No Typology Code available for payment source

## 2022-07-19 DIAGNOSIS — B370 Candidal stomatitis: Secondary | ICD-10-CM

## 2022-07-26 ENCOUNTER — Encounter: Admit: 2022-07-26 | Discharge: 2022-07-26 | Payer: No Typology Code available for payment source

## 2022-07-30 ENCOUNTER — Encounter: Admit: 2022-07-30 | Discharge: 2022-07-30 | Payer: No Typology Code available for payment source

## 2022-07-30 ENCOUNTER — Ambulatory Visit: Admit: 2022-07-30 | Discharge: 2022-07-30 | Payer: MEDICARE

## 2022-07-30 DIAGNOSIS — Z021 Encounter for pre-employment examination: Secondary | ICD-10-CM

## 2022-07-30 NOTE — Progress Notes
Date of Service: 07/30/2022    Employer: [employer]  Service: Post Offer Physical    Holly Maldonado is a 62 y.o. female.  DOB: Jan 29, 1961  MRN: [id number]     Subjective:             History of Present Illness    Patient reports history of anxiety and depression.    Current Outpatient Medications on File Prior to Visit   Medication Sig Dispense Refill    docosahexaenoic acid/epa (FISH OIL PO) Take 3 capsules by mouth daily.      ezetimibe (ZETIA) 10 mg tablet Take one tablet by mouth daily. 90 tablet 3    famotidine (PEPCID) 40 mg tablet Take one tablet by mouth twice daily. 180 tablet 3    flecainide (TAMBOCOR) 100 mg tablet Take one-half tablet by mouth twice daily. 90 tablet 3    FLUoxetine (PROZAC) 20 mg capsule Take one capsule by mouth daily. Take one capsule (20mg ) by mouth daily with additional two 40mg  capsules (80mg ) for total daily dose of 100mg . 90 capsule 2    FLUoxetine (PROZAC) 40 mg capsule Take two capsules by mouth daily. Take two capsules (80mg ) by mouth daily with one 20mg  capsule for total daily dose of 100mg . 180 capsule 2    lamoTRIgine (LAMICTAL) 100 mg tablet Take two tablets by mouth daily. 180 tablet 2    levothyroxine (SYNTHROID) 88 mcg tablet Take one tablet by mouth daily 30 minutes before breakfast. 90 tablet 1    magnesium oxide (MAG-OX) 400 mg (241.3 mg magnesium) tablet Take three tablets by mouth at bedtime daily. 1,200 mg = 3 tablets      other medication CITRUS BERGAMOT supplement. 1 tablet in the morning  Indications: for cholesterol      potassium citrate (UROCIT-K 10) 10 mEq (1,080 mg) tablet TAKE TWO TABLETS BY MOUTH TWICE DAILY. TAKE WITH FOOD. 120 tablet 2    rosuvastatin (CRESTOR) 10 mg tablet Take one tablet by mouth daily. 90 tablet 3     No current facility-administered medications on file prior to visit.            Review of Systems      Objective:         There were no vitals filed for this visit.  There is no height or weight on file to calculate BMI.       Physical Exam  Vitals and nursing note reviewed.   Constitutional:       Appearance: Normal appearance. She is normal weight.   HENT:      Head: Normocephalic.      Right Ear: Tympanic membrane, ear canal and external ear normal.      Left Ear: Tympanic membrane, ear canal and external ear normal.      Nose: Nose normal.      Mouth/Throat:      Mouth: Mucous membranes are moist.   Eyes:      General: Lids are normal. Lids are everted, no foreign bodies appreciated.      Extraocular Movements: Extraocular movements intact.      Conjunctiva/sclera: Conjunctivae normal.      Pupils: Pupils are equal, round, and reactive to light.      Comments: Cataract noted to right eye   Cardiovascular:      Rate and Rhythm: Normal rate and regular rhythm.      Pulses: Normal pulses.      Heart sounds: Normal heart  sounds.   Pulmonary:      Effort: Pulmonary effort is normal.      Breath sounds: Normal breath sounds.   Abdominal:      General: Abdomen is flat. Bowel sounds are normal.      Palpations: Abdomen is soft.   Musculoskeletal:         General: Normal range of motion.      Cervical back: Normal range of motion and neck supple.   Skin:     General: Skin is warm and dry.      Capillary Refill: Capillary refill takes less than 2 seconds.   Neurological:      General: No focal deficit present.      Mental Status: She is alert.   Psychiatric:         Mood and Affect: Mood normal.         Thought Content: Thought content normal.              Assessment and Plan:    Encounter Diagnosis:  (Z02.1) Pre-employment health screening examination - Plan: QUANTIFERON-TB GOLD PLUS QUEST, MEASLES AB IGG, MUMPS AB IGG, RUBELLA AB IGG, VARICELLA ZOSTER AB IGG, HEPATITIS B SURFACE AB IMMUNITY QN, QUEST       No no safety sensitive work requiring binocular vision.  Pending QFT

## 2022-07-31 ENCOUNTER — Encounter: Admit: 2022-07-31 | Discharge: 2022-07-31 | Payer: No Typology Code available for payment source

## 2022-08-02 ENCOUNTER — Encounter: Admit: 2022-08-02 | Discharge: 2022-08-02 | Payer: No Typology Code available for payment source

## 2022-08-02 ENCOUNTER — Ambulatory Visit: Admit: 2022-08-02 | Discharge: 2022-08-02 | Payer: MEDICARE

## 2022-08-02 DIAGNOSIS — Q615 Medullary cystic kidney: Secondary | ICD-10-CM

## 2022-08-02 DIAGNOSIS — N390 Urinary tract infection, site not specified: Secondary | ICD-10-CM

## 2022-08-02 DIAGNOSIS — K219 Gastro-esophageal reflux disease without esophagitis: Secondary | ICD-10-CM

## 2022-08-02 DIAGNOSIS — E785 Hyperlipidemia, unspecified: Secondary | ICD-10-CM

## 2022-08-02 DIAGNOSIS — R000 Tachycardia, unspecified: Secondary | ICD-10-CM

## 2022-08-02 DIAGNOSIS — G4733 Obstructive sleep apnea (adult) (pediatric): Secondary | ICD-10-CM

## 2022-08-02 DIAGNOSIS — H25813 Combined forms of age-related cataract, bilateral: Secondary | ICD-10-CM

## 2022-08-02 DIAGNOSIS — Z8659 Personal history of other mental and behavioral disorders: Secondary | ICD-10-CM

## 2022-08-02 DIAGNOSIS — J302 Other seasonal allergic rhinitis: Secondary | ICD-10-CM

## 2022-08-02 DIAGNOSIS — F32A Depressed: Secondary | ICD-10-CM

## 2022-08-02 DIAGNOSIS — E7800 Pure hypercholesterolemia, unspecified: Secondary | ICD-10-CM

## 2022-08-02 DIAGNOSIS — F99 Mental disorder, not otherwise specified: Secondary | ICD-10-CM

## 2022-08-02 DIAGNOSIS — E039 Hypothyroidism, unspecified: Secondary | ICD-10-CM

## 2022-08-02 DIAGNOSIS — E278 Other specified disorders of adrenal gland: Secondary | ICD-10-CM

## 2022-08-02 DIAGNOSIS — H527 Unspecified disorder of refraction: Secondary | ICD-10-CM

## 2022-08-02 DIAGNOSIS — D352 Benign neoplasm of pituitary gland: Secondary | ICD-10-CM

## 2022-08-02 DIAGNOSIS — G4730 Sleep apnea, unspecified: Secondary | ICD-10-CM

## 2022-08-02 DIAGNOSIS — F4310 Post-traumatic stress disorder, unspecified: Secondary | ICD-10-CM

## 2022-08-02 DIAGNOSIS — I499 Cardiac arrhythmia, unspecified: Secondary | ICD-10-CM

## 2022-08-02 MED ORDER — PREDNISOLONE ACETATE 1 % OP DRPS
1 [drp] | Freq: Four times a day (QID) | OPHTHALMIC | 3 refills | 14.00000 days | Status: AC
Start: 2022-08-02 — End: ?

## 2022-08-02 NOTE — Patient Instructions
Holly Maldonado  Ambulatory Clinic RN Care Coordinator    Department of Ophthalmology  [address], [address] [place], [place] [address]    Phone: [phone]  Fax: [phone]

## 2022-08-02 NOTE — Progress Notes
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  Risks/benefits/alternatives discussed with patient, discussed surgery to be done when it is affecting their activities of daily living.  The patient feels impaired and wishes to proceed with surgery.    Discussed all lens options- given unilateral cataract and mild Mrx OS, will defer toric/MFIOL. Ok wearing specs  Risks of surgery reviewed include: infection, bleeding, ptosis, dry eye syndrome, retinal detachment, retinal swelling, elevated eye pressure, corneal swelling, and the need for further surgery at the time of the cataract operation or afterward.  Rare but possible loss of vision and eye    Special issues discussed:  The patient has cataracts and is aware that all surgeries have potential complications.  Discussed that even though we aim for a refractive target, the calculations are not a perfect science, and the patient may still need glasses for some activities.  Discussed patient may need a YAG in the future.    Cataract Surgery Pre-operative Facesheet  Name: Holly Maldonado   Age: 62 y.o.   MRN: [id number]  Planned Surgery: Phaco OD only  Current Mrx: -2  Fellow eye status (Mrx): near emme  Refractive target: Emmetropia- minor residual astigmatism  Cornea considerations: None  Iris consideration: None  Flat and Still: Yes  Prior Surgery: None  Cataract type: NS 2+,   CME RF: None  IOP/Glaucoma concerns: None  Surgical considerations:  Lens Choice (RRE): [id number]   Case request placed. Consent: DOS . Drops sent to pharmacy: Today      Our surgery scheduling team will call patient to get scheduled for surgery        Refractive error  Wait on glasses until surgery          No follow-ups on file.      Christena Flake, MD  [place] Department of Ophthalmology        HPI:  OD is blurry    Exam:  Base Eye Exam       Visual Acuity (Snellen - Linear)         Right Left    Dist cc 20/250 20/25 slow -3      Correction: Glasses              Tonometry (Tonopen, 2:17 PM)         Right Left    Pressure 12 14              Pupils         Pupils Dark Shape React APD    Right PERRL 5 Round Brisk None    Left PERRL 5 Round Brisk None              Visual Fields (Counting fingers)         Left Right     Full Full              Extraocular Movement         Right Left     Full Full              Neuro/Psych  Oriented x3: Yes    Mood/Affect: Normal              Dilation       Both eyes: 1.0% Tropicamide, 2.5% Phenylephrine @ 2:14 PM                  Additional Tests       Glare Testing         Medium    Right 20/150    Left 20/60                  Slit Lamp and Fundus Exam       External Exam         Right Left    External Normal Normal              Slit Lamp Exam         Right Left    Lids/Lashes Normal Normal    Conjunctiva/Sclera White and quiet White and quiet    Cornea Clear Clear    Anterior Chamber Deep and quiet Deep and quiet    Iris Flat Flat    Lens 2+ NS with cort flecks 1+ NS    Anterior Vitreous Normal Normal              Fundus Exam         Right Left    Disc Sharp, healthy rim Sharp, healthy rim    C/D Ratio 0.4 0.4    Macula Flat Flat    Vessels Normal caliber and number Normal caliber and number    Periphery Attached no breaks or tears Attached no breaks or tears                  Refraction       Wearing Rx         Sphere Cylinder Axis Add    Right [place] +0.75 180 +2.50    Left +0.50 +0.25 175 +2.50      Type: PAL              Manifest Refraction         Sphere Cylinder Axis Dist VA Add    Right -2.00 +0.75 180 20/50 +2.50    Left +0.50 Sphere 20/25+2 +2.50                    IOL MASTER W/ IMPLANT CALCULATION          IOL Biometry conducted prior to cataract surgery OD               Key Ophthalmic History: For reference    Christena Flake, MD  [place] Department of Ophthalmology

## 2022-08-05 ENCOUNTER — Encounter: Admit: 2022-08-05 | Discharge: 2022-08-05 | Payer: No Typology Code available for payment source

## 2022-08-05 DIAGNOSIS — H25813 Combined forms of age-related cataract, bilateral: Secondary | ICD-10-CM

## 2022-08-05 MED ORDER — OBRIEN PHENYLEPHRINE 2.5%-TROPICAMIDE 1%-KETOROLAC 0.5%-LIDOCAINE 1% IN VISCOUS SOLN
1 [drp] | OPHTHALMIC | 0 refills
Start: 2022-08-05 — End: ?

## 2022-08-05 MED ORDER — TETRACAINE HCL (PF) 0.5 % OP DROP
1 [drp] | Freq: Once | OPHTHALMIC | 0 refills
Start: 2022-08-05 — End: ?

## 2022-08-06 ENCOUNTER — Encounter: Admit: 2022-08-06 | Discharge: 2022-08-06 | Payer: No Typology Code available for payment source

## 2022-08-10 ENCOUNTER — Ambulatory Visit: Admit: 2022-08-10 | Discharge: 2022-08-10 | Payer: No Typology Code available for payment source

## 2022-08-10 ENCOUNTER — Encounter: Admit: 2022-08-10 | Discharge: 2022-08-10 | Payer: No Typology Code available for payment source

## 2022-08-10 DIAGNOSIS — H25813 Combined forms of age-related cataract, bilateral: Secondary | ICD-10-CM

## 2022-08-10 NOTE — Progress Notes
Attempted to schedule patient for eye procedure with Dr. Greenwald, LVM for the patient to reach out to us @ [phone].

## 2022-08-16 ENCOUNTER — Encounter: Admit: 2022-08-16 | Discharge: 2022-08-16 | Payer: No Typology Code available for payment source

## 2022-08-18 NOTE — Patient Instructions
Clinic Visit Summary:             For questions or concerns,  please contact my Nurse Alycia Cooperwood at [phone] or send a My Chart message.     For URGENT issues after business hours, weekends, or holidays: Call [phone] and request for the Pulmonary Fellow to be paged.  For medication refills, please ask your pharmacy to send an electronic request.  Please allow at least 3 business days for medication refills.    For equipment issues, please contact your Durable Medical Equipment (DME) company directly.  To cancel, change, or schedule a clinic appointment: Call Pulmonary Scheduling at [phone].

## 2022-08-20 ENCOUNTER — Encounter: Admit: 2022-08-20 | Discharge: 2022-08-20 | Payer: No Typology Code available for payment source

## 2022-08-20 ENCOUNTER — Ambulatory Visit: Admit: 2022-08-20 | Discharge: 2022-08-21 | Payer: MEDICARE

## 2022-08-20 DIAGNOSIS — I499 Cardiac arrhythmia, unspecified: Secondary | ICD-10-CM

## 2022-08-20 DIAGNOSIS — F4310 Post-traumatic stress disorder, unspecified: Secondary | ICD-10-CM

## 2022-08-20 DIAGNOSIS — F32A Depressed: Secondary | ICD-10-CM

## 2022-08-20 DIAGNOSIS — F99 Mental disorder, not otherwise specified: Secondary | ICD-10-CM

## 2022-08-20 DIAGNOSIS — G4730 Sleep apnea, unspecified: Secondary | ICD-10-CM

## 2022-08-20 DIAGNOSIS — G4733 Obstructive sleep apnea (adult) (pediatric): Secondary | ICD-10-CM

## 2022-08-20 DIAGNOSIS — E785 Hyperlipidemia, unspecified: Secondary | ICD-10-CM

## 2022-08-20 DIAGNOSIS — J302 Other seasonal allergic rhinitis: Secondary | ICD-10-CM

## 2022-08-20 DIAGNOSIS — E039 Hypothyroidism, unspecified: Secondary | ICD-10-CM

## 2022-08-20 DIAGNOSIS — D352 Benign neoplasm of pituitary gland: Secondary | ICD-10-CM

## 2022-08-20 DIAGNOSIS — E278 Other specified disorders of adrenal gland: Secondary | ICD-10-CM

## 2022-08-20 DIAGNOSIS — E7800 Pure hypercholesterolemia, unspecified: Secondary | ICD-10-CM

## 2022-08-20 DIAGNOSIS — K219 Gastro-esophageal reflux disease without esophagitis: Secondary | ICD-10-CM

## 2022-08-20 DIAGNOSIS — Q615 Medullary cystic kidney: Secondary | ICD-10-CM

## 2022-08-20 DIAGNOSIS — R000 Tachycardia, unspecified: Secondary | ICD-10-CM

## 2022-08-20 DIAGNOSIS — Z8659 Personal history of other mental and behavioral disorders: Secondary | ICD-10-CM

## 2022-08-20 DIAGNOSIS — N390 Urinary tract infection, site not specified: Secondary | ICD-10-CM

## 2022-08-20 NOTE — Assessment & Plan Note
Adjusted settings as above.  Follow up in 2 months, sooner if needed.  If this change works will schedule her titration study otherwise, will send to ENT for awake endoscopy.

## 2022-09-07 ENCOUNTER — Encounter: Admit: 2022-09-07 | Discharge: 2022-09-07 | Payer: No Typology Code available for payment source

## 2022-09-07 DIAGNOSIS — N390 Urinary tract infection, site not specified: Secondary | ICD-10-CM

## 2022-09-07 DIAGNOSIS — E7800 Pure hypercholesterolemia, unspecified: Secondary | ICD-10-CM

## 2022-09-07 DIAGNOSIS — F99 Mental disorder, not otherwise specified: Secondary | ICD-10-CM

## 2022-09-07 DIAGNOSIS — R000 Tachycardia, unspecified: Secondary | ICD-10-CM

## 2022-09-07 DIAGNOSIS — K219 Gastro-esophageal reflux disease without esophagitis: Secondary | ICD-10-CM

## 2022-09-07 DIAGNOSIS — F4310 Post-traumatic stress disorder, unspecified: Secondary | ICD-10-CM

## 2022-09-07 DIAGNOSIS — Z8659 Personal history of other mental and behavioral disorders: Secondary | ICD-10-CM

## 2022-09-07 DIAGNOSIS — E039 Hypothyroidism, unspecified: Secondary | ICD-10-CM

## 2022-09-07 DIAGNOSIS — E785 Hyperlipidemia, unspecified: Secondary | ICD-10-CM

## 2022-09-07 DIAGNOSIS — I499 Cardiac arrhythmia, unspecified: Secondary | ICD-10-CM

## 2022-09-07 DIAGNOSIS — Q615 Medullary cystic kidney: Secondary | ICD-10-CM

## 2022-09-07 DIAGNOSIS — F32A Depressed: Secondary | ICD-10-CM

## 2022-09-07 DIAGNOSIS — G4730 Sleep apnea, unspecified: Secondary | ICD-10-CM

## 2022-09-07 DIAGNOSIS — D352 Benign neoplasm of pituitary gland: Secondary | ICD-10-CM

## 2022-09-07 DIAGNOSIS — J302 Other seasonal allergic rhinitis: Secondary | ICD-10-CM

## 2022-09-07 DIAGNOSIS — E278 Other specified disorders of adrenal gland: Secondary | ICD-10-CM

## 2022-09-07 DIAGNOSIS — G4733 Obstructive sleep apnea (adult) (pediatric): Secondary | ICD-10-CM

## 2022-09-09 ENCOUNTER — Encounter: Admit: 2022-09-09 | Discharge: 2022-09-09 | Payer: No Typology Code available for payment source

## 2022-09-09 NOTE — Progress Notes
[employer] ordered per Dr. Philomena Course. Order faxed to [facility]. Patient informed to complete when it arrives and return per directions.

## 2022-09-13 ENCOUNTER — Ambulatory Visit: Admit: 2022-09-13 | Discharge: 2022-09-13 | Payer: MEDICARE

## 2022-09-13 ENCOUNTER — Encounter: Admit: 2022-09-13 | Discharge: 2022-09-13 | Payer: No Typology Code available for payment source

## 2022-09-13 DIAGNOSIS — E559 Vitamin D deficiency, unspecified: Secondary | ICD-10-CM

## 2022-09-13 DIAGNOSIS — E7800 Pure hypercholesterolemia, unspecified: Secondary | ICD-10-CM

## 2022-09-13 LAB — COMPREHENSIVE METABOLIC PANEL
ALBUMIN: 4.8 g/dL (ref 3.5–5.0)
ALK PHOSPHATASE: 84 U/L (ref 25–110)
ALT: 21 U/L (ref 7–56)
ANION GAP: 11 (ref 3–12)
AST: 30 U/L (ref 7–40)
BLD UREA NITROGEN: 20 mg/dL (ref 7–25)
CALCIUM: 10 mg/dL (ref 8.5–10.6)
CHLORIDE: 103 MMOL/L (ref 98–110)
CO2: 27 MMOL/L (ref 21–30)
CREATININE: 1 mg/dL (ref 0.4–1.00)
EGFR: >60 mL/min (ref >60)
GLUCOSE,PANEL: 100 mg/dL (ref 70–100)
POTASSIUM: 4.6 MMOL/L (ref 3.5–5.1)
SODIUM: 141 MMOL/L (ref 137–147)
TOTAL PROTEIN: 7.7 g/dL (ref 6.0–8.0)

## 2022-09-13 LAB — LIPID PROFILE
CHOLESTEROL: 181 mg/dL (ref <200)
LDL: 83 mg/dL (ref <100)
TRIGLYCERIDES: 196 mg/dL — ABNORMAL HIGH (ref <150)

## 2022-09-13 LAB — PARATHYROID HORMONE: PTH HORMONE: 99 pg/mL — ABNORMAL HIGH (ref 10–65)

## 2022-09-13 LAB — CREATINE KINASE-CPK: CK TOTAL: 105 U/L (ref >40)

## 2022-09-14 ENCOUNTER — Encounter: Admit: 2022-09-14 | Discharge: 2022-09-14 | Payer: No Typology Code available for payment source

## 2022-09-14 ENCOUNTER — Ambulatory Visit: Admit: 2022-09-14 | Discharge: 2022-09-14 | Payer: No Typology Code available for payment source

## 2022-09-14 DIAGNOSIS — K219 Gastro-esophageal reflux disease without esophagitis: Secondary | ICD-10-CM

## 2022-09-14 DIAGNOSIS — E039 Hypothyroidism, unspecified: Secondary | ICD-10-CM

## 2022-09-14 DIAGNOSIS — R000 Tachycardia, unspecified: Secondary | ICD-10-CM

## 2022-09-14 DIAGNOSIS — F4310 Post-traumatic stress disorder, unspecified: Secondary | ICD-10-CM

## 2022-09-14 DIAGNOSIS — N390 Urinary tract infection, site not specified: Secondary | ICD-10-CM

## 2022-09-14 DIAGNOSIS — D352 Benign neoplasm of pituitary gland: Secondary | ICD-10-CM

## 2022-09-14 DIAGNOSIS — E7800 Pure hypercholesterolemia, unspecified: Secondary | ICD-10-CM

## 2022-09-14 DIAGNOSIS — I499 Cardiac arrhythmia, unspecified: Secondary | ICD-10-CM

## 2022-09-14 DIAGNOSIS — G4733 Obstructive sleep apnea (adult) (pediatric): Secondary | ICD-10-CM

## 2022-09-14 DIAGNOSIS — F99 Mental disorder, not otherwise specified: Secondary | ICD-10-CM

## 2022-09-14 DIAGNOSIS — J302 Other seasonal allergic rhinitis: Secondary | ICD-10-CM

## 2022-09-14 DIAGNOSIS — Q615 Medullary cystic kidney: Secondary | ICD-10-CM

## 2022-09-14 DIAGNOSIS — G4730 Sleep apnea, unspecified: Secondary | ICD-10-CM

## 2022-09-14 DIAGNOSIS — F32A Depressed: Secondary | ICD-10-CM

## 2022-09-14 DIAGNOSIS — E785 Hyperlipidemia, unspecified: Secondary | ICD-10-CM

## 2022-09-14 DIAGNOSIS — Z8659 Personal history of other mental and behavioral disorders: Secondary | ICD-10-CM

## 2022-09-14 DIAGNOSIS — E278 Other specified disorders of adrenal gland: Secondary | ICD-10-CM

## 2022-09-14 MED ORDER — MIDAZOLAM 1 MG/ML IJ SOLN
INTRAVENOUS | 0 refills | Status: DC
Start: 2022-09-14 — End: 2022-09-14

## 2022-09-14 MED ORDER — LIDOCAINE HCL 2 % MM JELL
TOPICAL | 0 refills | Status: DC
Start: 2022-09-14 — End: 2022-09-14

## 2022-09-14 MED ADMIN — TETRACAINE HCL (PF) 0.5 % OP DROP [305966]: 1 [drp] | OPHTHALMIC | @ 15:00:00 | Stop: 2022-09-14 | NDC 00065074114

## 2022-09-14 MED ADMIN — OBRIEN PHENYLEPHRINE 2.5%-TROPICAMIDE 1%-KETOROLAC 0.5%-LIDOCAINE 1% IN VISCOUS SOLN [213941]: 1 [drp] | OPHTHALMIC | @ 15:00:00 | Stop: 2022-09-14 | NDC 54029451009

## 2022-09-14 MED ADMIN — EPINEPHRINE HCL (PF) 1 MG/ML (1 ML) IJ SOLN [136248]: .8 mL | INTRAOCULAR | @ 16:00:00 | Stop: 2022-09-14 | NDC 54288010310

## 2022-09-14 MED ADMIN — EPINEPHRINE HCL (PF) 1 MG/ML (1 ML) IJ SOLN [136248]: 500 mL | INTRAOCULAR | @ 16:00:00 | Stop: 2022-09-14 | NDC 54288010310

## 2022-09-14 MED ADMIN — ACETAMINOPHEN 500 MG PO TAB [102]: 1000 mg | ORAL | @ 17:00:00 | Stop: 2022-09-14 | NDC 50580045711

## 2022-09-14 MED ADMIN — LIDOCAINE (PF) 40 MG/ML (4 %) IJ SOLN [4455]: .8 mL | INTRAOCULAR | @ 16:00:00 | Stop: 2022-09-14 | NDC 00409428301

## 2022-09-14 MED ADMIN — BALANCED SALT SOLN NO.2 IRRIG. IO SOLN [37578]: .8 mL | INTRAOCULAR | @ 16:00:00 | Stop: 2022-09-14 | NDC 00065079515

## 2022-09-14 MED ADMIN — BALANCED SALT SOLN 500ML BAG [212143]: 500 mL | INTRAOCULAR | @ 16:00:00 | Stop: 2022-09-14 | NDC 00065179504

## 2022-09-14 NOTE — Anesthesia Pre-Procedure Evaluation
MOAnesthesia Pre-Procedure Evaluation    Name: Holly Maldonado      MRN: [id number]     DOB: [birth year]     Age: 62 y.o.     Sex: female   _________________________________________________________________________     Procedure Info:   Procedure Information       Anesthesia Start Date/Time: 09/14/22 1056    Procedure: MANUAL/ MECHANICAL EXTRACAPSULAR CATARACT REMOVAL WITH INSERTION INTRAOCULAR LENS PROSTHESIS - 1 STAGE (Right) - CC60WF 22.5    Location: [facility] OR02 / [facility]2 OR    Surgeons: Gilmore Laroche, MD          Physical Assessment  Vital Signs (last filed in past 24 hours):  BP: 144/84 (05/07 1027)  Temp: 36.7 ?C (98 ?F) (05/07 1027)  Pulse: 57 (05/07 1027)  Respirations: 14 PER MINUTE (05/07 1027)  SpO2: 97 % (05/07 1027)  Height: 162.6 cm (5' 4) (05/07 1027)  Weight: 81.6 kg (180 lb) (05/07 1027)      Patient History   Allergies   Allergen Reactions    Buspirone ANXIETY    Fetzima [Levomilnacipran] SEE COMMENTS     Per pt very depressed, couldn't stop crying.    Ipratropium SEE COMMENTS     Blurry vision with ipratropium nasal spray    Lurasidone SEE COMMENTS    Trazodone HEADACHE        Current Medications    Medication Directions   aspirin EC (ASPIR-LOW) 81 mg tablet Take one tablet by mouth daily.   docosahexaenoic acid/epa (FISH OIL PO) Take 3 capsules by mouth daily.   ezetimibe (ZETIA) 10 mg tablet Take one tablet by mouth daily.   famotidine (PEPCID) 40 mg tablet Take one tablet by mouth twice daily.   flecainide (TAMBOCOR) 100 mg tablet Take one-half tablet by mouth twice daily.   FLUoxetine (PROZAC) 20 mg capsule Take one capsule by mouth daily. Take one capsule (20mg ) by mouth daily with additional two 40mg  capsules (80mg ) for total daily dose of 100mg .   FLUoxetine (PROZAC) 40 mg capsule Take two capsules by mouth daily. Take two capsules (80mg ) by mouth daily with one 20mg  capsule for total daily dose of 100mg .   lamoTRIgine (LAMICTAL) 100 mg tablet Take two tablets by mouth daily.   levothyroxine (SYNTHROID) 88 mcg tablet Take one tablet by mouth daily 30 minutes before breakfast.   magnesium oxide (MAG-OX) 400 mg (241.3 mg magnesium) tablet Take three tablets by mouth at bedtime daily. 1,200 mg = 3 tablets   other medication CITRUS BERGAMOT supplement. 1 tablet in the morning  Indications: for cholesterol   potassium citrate (UROCIT-K 10) 10 mEq (1,080 mg) tablet TAKE TWO TABLETS BY MOUTH TWICE DAILY. TAKE WITH FOOD.   prednisolone acetate (PRED FORTE) 1 % ophthalmic suspension Apply one drop to right eye as directed four times daily. Wait to start until after eye surgery   rosuvastatin (CRESTOR) 10 mg tablet Take one tablet by mouth daily.         Review of Systems/Medical History        PONV Screening: Non-smoker and Female sex    No history of anesthetic complications    Family history of anesthetic complications ( mother had unknown reaction 40 yrs ago)      Airway         TMJ      Pulmonary       Not a current smoker (quit in 49. 7 PYH)        Asthma (rare use  of rescue inhaler) well controlled        Obstructive Sleep Apnea ([place])      Cardiovascular         Exercise tolerance: >4 METS      Beta Blocker therapy: No      Beta blockers within 24 hours: n/a      Hypertension, well controlled          CAD: calcium score 0.      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      Obesity: Class 1 (BMI 30-34.9)        Adrenal nodule L - removed    Constitution - negative       Physical Exam    Airway Findings      Mallampati: II      TM distance: >3 FB      Neck ROM: full      Mouth opening: good      Airway patency: adequate    Dental Findings:         Comments: crowns on molars      Cardiovascular Findings:       Rhythm: regular      Rate: normal    Pulmonary Findings:       Breath sounds clear to auscultation.    Abdominal Findings:       Obese    Neurological Findings:       Alert and oriented x 3    Constitutional findings:       No acute distress      Well-developed      Well-nourished       Diagnostic Tests      Hematology:   Lab Results   Component Value Date    HGB 11.3 08/01/2021    HCT 33.2 08/01/2021    PLTCT 234 08/01/2021    WBC 7.2 08/01/2021    NEUT 54 02/08/2017    ANC 2.30 02/08/2017    ALC 1.30 02/08/2017    MONA 10 02/08/2017    AMC 0.40 02/08/2017    EOSA 3 02/08/2017    ABC 0.10 02/08/2017    MCV 87.7 08/01/2021    MCH 29.8 08/01/2021    MCHC 34.0 08/01/2021    MPV 8.0 08/01/2021  RDW 14.0 08/01/2021         General Chemistry:   Lab Results   Component Value Date    NA 141 09/13/2022    K 4.6 09/13/2022    CL 103 09/13/2022    CO2 27 09/13/2022    GAP 11 09/13/2022    BUN 20 09/13/2022    CR 1.00 09/13/2022    GLU 100 09/13/2022    GLU 93 09/25/2019    CA 10.4 09/13/2022    ALBUMIN 4.8 09/13/2022    OBSCA 1.27 06/10/2022    MG 1.9 09/12/2020    TOTBILI 0.5 09/13/2022    PO4 3.2 04/27/2022      Coagulation:   Lab Results   Component Value Date    PTT 30.4 02/08/2017    INR 1.0 02/08/2017     Stress echo 09/14/19:  Baseline Echocardiogram:  LVEF=55%.  Normal left ventricular size and systolic function.  Exercise Stress ECG Summary:  Baseline ECG shows sinus rhythm.  No symptoms suggestive of angina.  No new diagnostic ST-segment changes during exercise or recovery.  No sustained ectopy or arrhythmias.  Good exercise capacity.  Patient exercised for 8 minutes & 11 seconds achieving 90% of maximum predicted heart rate for age & gender.    Duke Treadmill Score is 8.  Exercise Stress Echocardiogram Summary:  No new regional wall motion abnormalities.  Global left ventricular systolic function becomes hyperdynamic.  Left ventricular ejection fraction increases and left ventricular volume decreases.  Conclusion:   Low risk Duke Treadmill Score.   Negative exercise stress echo without evidence of inducible ischemia.  Low risk for cardiovascular complications.    EKG 08/14/19  sinus rhythm multiple PVC; left bundle inferior axis consistent with RVOT origin      Anesthesia Plan    ASA score: 3   Plan: MAC  Induction method: intravenous  NPO status: acceptable      Informed Consent  Anesthetic plan and risks discussed with patient.  Use of blood products discussed with patient  Blood Consent: consented        Lab:   ZOX:[id number]  Consult:None    PAC Plan  Alerts

## 2022-09-14 NOTE — Anesthesia Post-Procedure Evaluation
Post-Anesthesia Evaluation    Name: Holly Maldonado      MRN: [id number]     DOB: 1960/06/13     Age: 62 y.o.     Sex: female   __________________________________________________________________________     Procedure Information       Anesthesia Start Date/Time: 09/14/22 1055    Procedure: MANUAL/ MECHANICAL EXTRACAPSULAR CATARACT REMOVAL WITH INSERTION INTRAOCULAR LENS PROSTHESIS - 1 STAGE (Right: Eye) - CC60WF 22.5    Location: [facility] OR02 / [facility] OR    Surgeons: Gilmore Laroche, MD            Post-Anesthesia Vitals  BP: 118/81 (05/07 1148)  Temp: 36.7 ?C (98 ?F) (05/07 1129)  Pulse: 52 (05/07 1148)  Respirations: 12 PER MINUTE (05/07 1148)  SpO2: 96 % (05/07 1148)   Vitals Value Taken Time   BP 118/81 09/14/22 1148   Temp 36.7 ?C (98 ?F) 09/14/22 1129   Pulse 52 09/14/22 1148   Respirations 12 PER MINUTE 09/14/22 1148   SpO2 96 % 09/14/22 1148   O2 Device     ABP     ART BP           Post Anesthesia Evaluation Note    Evaluation location: Pre/Post  Patient participation: recovered; patient participated in evaluation  Level of consciousness: alert    Pain score: 2  Pain management: adequate    Hydration: normovolemia  Temperature: 36.0?C - 38.4?C  Airway patency: adequate    Perioperative Events       Post-op nausea and vomiting: no PONV    Postoperative Status  Cardiovascular status: hemodynamically stable  Respiratory status: spontaneous ventilation  Follow-up needed: none  Additional comments:   Patient is ok for d/c after 15 min of PACU time and when RN assesss that patient meets d/c criteria from Phase 1 care.          Perioperative Events  There were no known complications for this encounter.

## 2022-09-15 ENCOUNTER — Encounter: Admit: 2022-09-15 | Discharge: 2022-09-15 | Payer: No Typology Code available for payment source

## 2022-09-15 ENCOUNTER — Ambulatory Visit: Admit: 2022-09-15 | Discharge: 2022-09-16 | Payer: MEDICARE

## 2022-09-15 DIAGNOSIS — Z961 Presence of intraocular lens: Secondary | ICD-10-CM

## 2022-09-15 MED ORDER — NEXLIZET 180-10 MG PO TAB
1 | ORAL_TABLET | Freq: Every day | ORAL | 3 refills | 30.00000 days | Status: AC
Start: 2022-09-15 — End: ?

## 2022-09-15 NOTE — Progress Notes
Testing for Next Visit:  POM1 PCIOL visit  Mrx OU  VA, IOP, Pupils  Mac OCT if VA down  Dilate OU            Assessment and Plan:    Pseudophakia, OD  POD1 from uncomplicated phaco (MFG/Mitchell 09/14/22) distance target  - Doing well, IOP and VA acceptable  - Wear shield at night for one week  - Monitor for signs of endophthalmitis or retinal detachment: redness, pain, photophobia, floaters, flashing lights, or curtains in the vision  - Start PF QID, then taper as directed        Return in about 3 weeks (around 10/06/2022) for return short post op.      Christena Flake, MD  [facility]        HPI:  Patient presents with:  Post-op: 1 day PO OD; Patient states vision little blurry today and she is unable to read her phone. Slight HA today.     POD1 s/p PCIOL OD. Doing well, has some twitching of her right eye lid.       Exam:  Base Eye Exam       Visual Acuity (Snellen - Linear)         Right Left    Dist sc 20/30-2     Dist ph sc NI               Tonometry (Tonopen, 11:05 AM)         Right Left    Pressure 29               Pupils         Dark Shape APD    Right 4.5 Round None    Left 4.5 Round None              Extraocular Movement         Right Left     Full Full              Neuro/Psych       Oriented x3: Yes    Mood/Affect: Normal                  Slit Lamp and Fundus Exam       External Exam         Right Left    External Normal               Slit Lamp Exam         Right Left    Lids/Lashes Normal     Conjunctiva/Sclera White and quiet     Cornea Trc temporal edema     Anterior Chamber 2+ cell     Iris Flat     Lens PCIOL     Anterior Vitreous Normal                           Key Ophthalmic History: For reference    Christena Flake, MD  [facility]

## 2022-09-16 ENCOUNTER — Encounter: Admit: 2022-09-16 | Discharge: 2022-09-16 | Payer: No Typology Code available for payment source

## 2022-09-16 DIAGNOSIS — D352 Benign neoplasm of pituitary gland: Secondary | ICD-10-CM

## 2022-09-16 DIAGNOSIS — E278 Other specified disorders of adrenal gland: Secondary | ICD-10-CM

## 2022-09-16 DIAGNOSIS — F4310 Post-traumatic stress disorder, unspecified: Secondary | ICD-10-CM

## 2022-09-16 DIAGNOSIS — E039 Hypothyroidism, unspecified: Secondary | ICD-10-CM

## 2022-09-16 DIAGNOSIS — I499 Cardiac arrhythmia, unspecified: Secondary | ICD-10-CM

## 2022-09-16 DIAGNOSIS — G4730 Sleep apnea, unspecified: Secondary | ICD-10-CM

## 2022-09-16 DIAGNOSIS — F99 Mental disorder, not otherwise specified: Secondary | ICD-10-CM

## 2022-09-16 DIAGNOSIS — E785 Hyperlipidemia, unspecified: Secondary | ICD-10-CM

## 2022-09-16 DIAGNOSIS — J302 Other seasonal allergic rhinitis: Secondary | ICD-10-CM

## 2022-09-16 DIAGNOSIS — Q615 Medullary cystic kidney: Secondary | ICD-10-CM

## 2022-09-16 DIAGNOSIS — G4733 Obstructive sleep apnea (adult) (pediatric): Secondary | ICD-10-CM

## 2022-09-16 DIAGNOSIS — N390 Urinary tract infection, site not specified: Secondary | ICD-10-CM

## 2022-09-16 DIAGNOSIS — R000 Tachycardia, unspecified: Secondary | ICD-10-CM

## 2022-09-16 DIAGNOSIS — E7800 Pure hypercholesterolemia, unspecified: Secondary | ICD-10-CM

## 2022-09-16 DIAGNOSIS — K219 Gastro-esophageal reflux disease without esophagitis: Secondary | ICD-10-CM

## 2022-09-16 DIAGNOSIS — F32A Depressed: Secondary | ICD-10-CM

## 2022-09-16 DIAGNOSIS — Z8659 Personal history of other mental and behavioral disorders: Secondary | ICD-10-CM

## 2022-09-17 ENCOUNTER — Encounter: Admit: 2022-09-17 | Discharge: 2022-09-17 | Payer: No Typology Code available for payment source

## 2022-09-23 ENCOUNTER — Encounter: Admit: 2022-09-23 | Discharge: 2022-09-23 | Payer: No Typology Code available for payment source

## 2022-09-24 ENCOUNTER — Encounter: Admit: 2022-09-24 | Discharge: 2022-09-24 | Payer: No Typology Code available for payment source

## 2022-09-27 ENCOUNTER — Ambulatory Visit: Admit: 2022-09-27 | Discharge: 2022-09-27 | Payer: No Typology Code available for payment source

## 2022-09-27 ENCOUNTER — Ambulatory Visit: Admit: 2022-09-27 | Discharge: 2022-09-27 | Payer: MEDICARE

## 2022-09-27 ENCOUNTER — Encounter: Admit: 2022-09-27 | Discharge: 2022-09-27 | Payer: No Typology Code available for payment source

## 2022-09-27 DIAGNOSIS — E039 Hypothyroidism, unspecified: Secondary | ICD-10-CM

## 2022-09-27 DIAGNOSIS — E213 Hyperparathyroidism, unspecified: Secondary | ICD-10-CM

## 2022-09-27 DIAGNOSIS — D352 Benign neoplasm of pituitary gland: Secondary | ICD-10-CM

## 2022-09-27 LAB — IONIZED CALCIUM: IONIZED CALCIUM: 1.2 MMOL/L (ref 1.0–1.3)

## 2022-09-27 NOTE — Progress Notes
Chief Complaint   Patient presents with    Hypothyroidism   Pituitary microadenoma    Date of Service: 09/27/2022    Holly Maldonado is a 62 y.o. female. DOB: 06-09-1960   MRN#: [id number]    HPI:  Hypothyroidism: She is on levothyroxine 88 mcg daily.  Dose was reduced earlier 2022.  She is compliant with medicine and takes it on an empty stomach in morning.     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 He has been followed for the prolactin. She appears to have had regression of pituitary adenoma in imagine 11/2017.  No adenoma was seen in 2020 on MRI.  Last prolactin level was normal.  She denies breast complaints, discharge.    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    Patient has a history of depression and has been  Lithium, Prozac, Lamictal, Cymbalta in the past.    High PTH: She has past history of nephrolithiasis and she has medullary sponge kidney disease.  Her first kidney stone was 22 years back.  She had ultrasound in 2022 that showed kidney stones.  She was started on chlorthalidone by nephrologist in 2022 as her 24-hour urine calcium was high.  PTH was checked 2023 and it was found to be elevated.  We stopped chlorthalidone.    PTH 2024 was normal.  Blood calcium has been normal.  She is off chlorthalidone.  She is planning to do 24-hour urine calcium this week.    She has maybe 1 serving of calcium rich food on a daily basis.  Otherwise diet is low in calcium.  She takes a MV.    Review of Systems   Constitutional:  Positive for malaise/fatigue. Negative for fever.   Cardiovascular:  Negative for chest pain.   Psychiatric/Behavioral:  Positive for depression.          Objective:      aspirin EC (ASPIR-LOW) 81 mg tablet Take one tablet by mouth daily.    bempedoic acid-ezetimibe (NEXLIZET) 180-10 mg tablet Take one tablet by mouth daily.    docosahexaenoic acid/epa (FISH OIL PO) Take 3 capsules by mouth daily.    famotidine (PEPCID) 40 mg tablet Take one tablet by mouth twice daily.    flecainide (TAMBOCOR) 100 mg tablet Take one-half tablet by mouth twice daily.    FLUoxetine (PROZAC) 20 mg capsule Take one capsule by mouth daily. Take one capsule (20mg ) by mouth daily with additional two 40mg  capsules (80mg ) for total daily dose of 100mg .    FLUoxetine (PROZAC) 40 mg capsule Take two capsules by mouth daily. Take two capsules (80mg ) by mouth daily with one 20mg  capsule for total daily dose of 100mg .    lamoTRIgine (LAMICTAL) 100 mg tablet Take two tablets by mouth daily.    levothyroxine (SYNTHROID) 88 mcg tablet Take one tablet by mouth daily 30 minutes before breakfast.    magnesium oxide (MAG-OX) 400 mg (241.3 mg magnesium) tablet Take three tablets by mouth at bedtime daily. 1,200 mg = 3 tablets    other medication CITRUS BERGAMOT supplement. 1 tablet in the morning  Indications: for cholesterol    potassium citrate (UROCIT-K 10) 10 mEq (1,080 mg) tablet TAKE TWO TABLETS BY MOUTH TWICE DAILY. TAKE WITH FOOD.    prednisolone acetate (PRED  FORTE) 1 % ophthalmic suspension Apply one drop to right eye as directed four times daily. Wait to start until after eye surgery    rosuvastatin (CRESTOR) 10 mg tablet Take one tablet by mouth daily.     There were no vitals filed for this visit.    There is no height or weight on file to calculate BMI.     Physical Exam  Vitals and nursing note reviewed.   Constitutional:       General: She is not in acute distress.     Appearance: Normal appearance. She is not ill-appearing, toxic-appearing or diaphoretic.   HENT:      Head: Normocephalic and atraumatic.      Nose: Nose normal.   Eyes:      Conjunctiva/sclera: Conjunctivae normal.   Neurological:      Mental Status: She is alert and oriented to person, place, and time.   Psychiatric: Mood and Affect: Mood normal.               Lab   Thyroid Studies    Lab Results   Component Value Date/Time    TSH 1.72 03/17/2022 09:19 AM    No results found for: Edsel Petrin, [facility]     11/2018 MRI  There is homogeneous enhancement of the pituitary gland, which   demonstrates a somewhat flattened configuration along the sellar floor   without sellar expansion. The posterior pituitary bright spot is normal in   appearance and location. No discrete sellar or suprasellar mass is   identified. The infundibulum,  optic chiasm, and cavernous sinuses   demonstrate normal signal and morphology.     The ventricles and subarachnoid spaces are normal in size and   configuration. There are a few scattered foci of supratentorial white   matter FLAIR hyperintensity, in a bifrontal and subcortical predominant   distribution. There is no midline shift or mass effect. There is no area   of abnormal contrast enhancement. The vascular flow-voids are   unremarkable. Diffusion weighted imaging is not indicative of acute or   recent infarct.       Outside labs:  02/13/2017  TSH 0.37 (reference 0.40-4.5), free T4 1.1 (reference 0.8- 1.6)  Normal CMP, normal CBC  04/20/2018  Cholesterol 276, LDL 195, HDL 79  TSH 12.12       05/23/2015 MRI brain there is relatively unchanged appearance of 6 mm area of non-enhancement noted within posterior aspect of the right lateral aspect of the pituitary gland concerning for underlying pituitary microadenoma  06/22/2017 bone alkaline phosphatase normal  10/10/2017 ACTH at 9:48 AM 16.66 (reference 7.2-63.3)  10/17/2017 prolactin 42.79 (reference 4.79-23.3) normal at 99.17, FSH and LH both normal 72.36 and 31.69 respectively  11/2017 MRI brain 0.5 x 0.4 x 0.7 cm focal area of nodular enhancement along the left temporal convexity extra-axial space 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  Minimal partial empty sella without convincing evidence of pituitary microadenoma    24-hour urine calcium  November 2022: 375  September 2023 219      Assessment and Plan:    Holly Maldonado was seen today for hypothyroidism.    Diagnoses and all orders for this visit:    Hyperparathyroidism (HCC)    Hypothyroidism (acquired)    Pituitary adenoma (HCC)          Assessment     Hx Pititary microadenoma / Partial Empty Sella  05/23/2015 MRI brain there is relatively unchanged appearance of 6 mm area of  non-enhancement noted within posterior aspect of the right lateral aspect of the pituitary gland concerning for underlying pituitary microadenoma  10/10/2017 ACTH at 9:48 AM 16.66 (reference 7.2-63.3)  10/17/2017 prolactin 42.79 (reference 4.79-23.3) normal at 99.17, FSH and LH both normal 72.36 and 31.69 respectively  11/2017 MRI brain 0.5 x 0.4 x 0.7 cm focal area of nodular enhancement along the left temporal convexity extra-axial space 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  Minimal partial empty sella without convincing evidence of pituitary microadenoma  2020: No microadenoma was seen.    Prolactinoma  Previous imaging demonstrates microadenoma  10/17/2017 prolactin 42.79 (reference 4.79-23.3)  Repeat prolactin level was normal.    Uncontrolled hypothyroidism  Dx: 1997  On replacement.  TSH is at goal.  Continue levothyroxine 88 mcg daily.    High PTH: She has past history of nephrolithiasis and she has medullary sponge kidney disease.  Her first kidney stone was 22 years back.  She had ultrasound in 2022 that showed kidney stones.  She was started on chlorthalidone by nephrologist in 2022 as her 24-hour urine calcium was high.  I stopped chlorthalidone in 2023 and repeated 24-hour urine calcium.  24-hour urine calcium is at 210.  Ionized calcium little high.    PTH has been high off-and-on.  Ultrasound did not show any adenomas.  Blood calcium normal.  She is off chlorthalidone now.  Will repeat 24-hour urine calcium now without chlorthalidone.    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           30 minutes spent reviewing nephrology records, discussing treatment plan with the patient, documentation.  Electronically signed by Lucrezia Starch, MD 09/27/2022
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
## 2022-10-07 ENCOUNTER — Ambulatory Visit: Admit: 2022-10-07 | Discharge: 2022-10-07 | Payer: MEDICARE

## 2022-10-07 ENCOUNTER — Encounter: Admit: 2022-10-07 | Discharge: 2022-10-07 | Payer: No Typology Code available for payment source

## 2022-10-07 ENCOUNTER — Ambulatory Visit: Admit: 2022-10-07 | Discharge: 2022-10-07 | Payer: No Typology Code available for payment source

## 2022-10-07 DIAGNOSIS — F332 Major depressive disorder, recurrent severe without psychotic features: Secondary | ICD-10-CM

## 2022-10-07 DIAGNOSIS — F4312 Post-traumatic stress disorder, chronic: Secondary | ICD-10-CM

## 2022-10-07 DIAGNOSIS — Z961 Presence of intraocular lens: Secondary | ICD-10-CM

## 2022-10-07 DIAGNOSIS — F502 Bulimia nervosa: Secondary | ICD-10-CM

## 2022-10-07 NOTE — Patient Instructions
Continue psychotropic medications as prescribed unless changes listed below:   - continue current medications as prescribed  - will look for [facility] records    Return to clinic in 2 months.  Please call the clinic to reschedule or cancel appointments if somethings changes.     Our clinic is dedicated to making sure your prescriptions are filled in a timely manner during regular business hours (8am-5pm).  If you need a medication refill, please call your pharmacy to request a refill.  Please make sure to request refills at least 72 hours in advance of your last dose.  Your pharmacy will reach out to [name] electronically, which is the preferred method.  Please try to refrain from paging the on-call psychiatrist regarding medication refills (including controlled substances), as you may not receive a refill or a full refill at that time.      For nonemergent concerns, you may reach out to the clinic via MyChart. You should receive a response within 72 business hours from our clinic staff/providers.    [facility] provides its patients with 24/7 care. If you are concerned about an impending psychiatric emergency (i.e. if you have any concerns about risk of harm to yourself, risk of harm to others or feel unsure about your ability to care for yourself), you may reach out to the overnight psychiatrist on-call by calling the main [facility] operator line outside of normal business hours ([phone]). If you are unsure if you are experiencing a psychiatric emergency, please ask the operator to page the on-call psychiatrist for clarification.     If you require an immediate response from a health care professional, please do not call the on-call psychiatrist and call 911 directly.     In the event of a safety concern or suicidal thoughts, call 911 or go to the nearest emergency room. National Suicide Prevention Lifeline (850)337-1239 (Talk). Crisis Text Hotline (text (304) 034-2696).     It was good seeing you!   Dr. Raquel Liviya Santini, Psychiatry Resident

## 2022-10-07 NOTE — Progress Notes
ATTESTATION    I personally performed the key portions of the E/M visit, discussed case with resident and concur with resident documentation of history, physical exam, assessment, and treatment plan unless otherwise noted. PDMP reviewed.    Patient did well on effexor in the past, especially in conjunction with abilify. Stopped abilify due to weight gain and reported decreased WBC. Does not recall stopping effexor due to a side effect. Discussed potential retrial of venlafaxine in lieu of prozac. Patient has felt that current regimen has helped some. Is open to changes however will wait until after sleep medicine visits  - investigating device, awake endoscopy with ENT per EMR if needed - as I suspect a large component of fatigue/concentration issues related to OSA. [facility] discussed. Patient completed this in the past, gave permission to reach out to [facility] to access information- she will also look for the results at home.       Staff name:  Karrie Meres, DO Date of Service:  10/07/2022

## 2022-10-07 NOTE — Progress Notes
Assessment and Plan:    Pseudophakia, OD  POM1 from uncomplicated phaco ([facility] 09/14/22) distance target  - Doing well, IOP and VA acceptable  - continue off of medicated drops  - ATs for comfort  -monitor for visual changes, including RD warning signs such as new floaters, flashing light or peripheral vision changes          Return in about 1 year (around 10/07/2023) for comprehensive follow-up with Dr. Marguerite Olea, MD  [facility]        HPI:  Doing great, happy with VA      Exam:  Base Eye Exam       Visual Acuity (Snellen - Linear)         Right Left    Dist sc 20/25 20/20-1 slow              Tonometry (Tonopen, 3:32 PM)         Right Left    Pressure 18 16              Pupils         Dark Shape APD    Right 4 Round None    Left 4 Round None              Extraocular Movement         Right Left     Full Full              Neuro/Psych       Oriented x3: Yes    Mood/Affect: Normal              Dilation       Right eye: 0.5% Tropicamide @ 3:32 PM                  Slit Lamp and Fundus Exam       External Exam         Right Left    External Normal Normal              Slit Lamp Exam         Right Left    Lids/Lashes Normal Normal    Conjunctiva/Sclera White and quiet White and quiet    Cornea Clear Clear    Anterior Chamber rare cell, deep Deep and quiet    Iris Flat Flat    Lens PCIOL 1+ NS    Anterior Vitreous Normal Normal              Fundus Exam         Right Left    Disc Sharp healthy rim     C/D Ratio 0.4     Macula Flat     Vessels Normal caliber and number     Periphery Attached no breaks or tears                   Refraction       Manifest Refraction         Sphere Cylinder Axis Dist VA Add    Right -0.25 +0.50 160 20/20+2 +2.00    Left +0.25 +0.25 015 20/20 +2.00                          Key Ophthalmic History: For reference    Christena Flake, MD  [facility]

## 2022-10-07 NOTE — Patient Instructions
Prescription strength olopatadine 0.7%  1 drop once a day in both eyes     OR    Ketotifen or olopatadine 0.1%  1 drop twice a day in both eyes to help with eye itching related to allergies

## 2022-10-09 ENCOUNTER — Encounter: Admit: 2022-10-09 | Discharge: 2022-10-09 | Payer: No Typology Code available for payment source

## 2022-10-09 MED ORDER — POTASSIUM CITRATE 10 MEQ (1,080 MG) PO TBER
20 meq | ORAL_TABLET | Freq: Two times a day (BID) | ORAL | 0 refills
Start: 2022-10-09 — End: ?

## 2022-10-12 ENCOUNTER — Encounter: Admit: 2022-10-12 | Discharge: 2022-10-12 | Payer: No Typology Code available for payment source

## 2022-10-12 ENCOUNTER — Ambulatory Visit: Admit: 2022-10-12 | Discharge: 2022-10-12 | Payer: MEDICARE

## 2022-10-12 DIAGNOSIS — E213 Hyperparathyroidism, unspecified: Secondary | ICD-10-CM

## 2022-10-12 DIAGNOSIS — N200 Calculus of kidney: Secondary | ICD-10-CM

## 2022-10-12 DIAGNOSIS — E210 Primary hyperparathyroidism: Secondary | ICD-10-CM

## 2022-10-12 LAB — BASIC METABOLIC PANEL
ANION GAP: 9 (ref 3–12)
BLD UREA NITROGEN: 27 mg/dL — ABNORMAL HIGH (ref 7–25)
CALCIUM: 10 mg/dL (ref 8.5–10.6)
CO2: 27 MMOL/L (ref 21–30)
CREATININE: 1 mg/dL — ABNORMAL HIGH (ref 0.4–1.00)
EGFR: >60 mL/min (ref >60)
GLUCOSE,PANEL: 110 mg/dL — ABNORMAL HIGH (ref 70–100)
POTASSIUM: 4.2 MMOL/L (ref 3.5–5.1)
SODIUM: 137 MMOL/L (ref 137–147)

## 2022-10-12 LAB — PARATHYROID HORMONE: PTH HORMONE: 95 pg/mL — ABNORMAL HIGH (ref 10–65)

## 2022-10-12 LAB — IONIZED CALCIUM: IONIZED CALCIUM: 1.2 MMOL/L (ref 1.0–1.3)

## 2022-10-13 ENCOUNTER — Encounter: Admit: 2022-10-13 | Discharge: 2022-10-13 | Payer: No Typology Code available for payment source

## 2022-10-13 ENCOUNTER — Ambulatory Visit: Admit: 2022-10-13 | Discharge: 2022-10-14 | Payer: MEDICARE

## 2022-10-13 DIAGNOSIS — E785 Hyperlipidemia, unspecified: Secondary | ICD-10-CM

## 2022-10-13 DIAGNOSIS — Q615 Medullary cystic kidney: Secondary | ICD-10-CM

## 2022-10-13 DIAGNOSIS — N200 Calculus of kidney: Secondary | ICD-10-CM

## 2022-10-13 DIAGNOSIS — I499 Cardiac arrhythmia, unspecified: Secondary | ICD-10-CM

## 2022-10-13 DIAGNOSIS — E7800 Pure hypercholesterolemia, unspecified: Secondary | ICD-10-CM

## 2022-10-13 DIAGNOSIS — R000 Tachycardia, unspecified: Secondary | ICD-10-CM

## 2022-10-13 DIAGNOSIS — F32A Depressed: Secondary | ICD-10-CM

## 2022-10-13 DIAGNOSIS — E278 Other specified disorders of adrenal gland: Secondary | ICD-10-CM

## 2022-10-13 DIAGNOSIS — J302 Other seasonal allergic rhinitis: Secondary | ICD-10-CM

## 2022-10-13 DIAGNOSIS — N390 Urinary tract infection, site not specified: Secondary | ICD-10-CM

## 2022-10-13 DIAGNOSIS — G4733 Obstructive sleep apnea (adult) (pediatric): Secondary | ICD-10-CM

## 2022-10-13 DIAGNOSIS — F99 Mental disorder, not otherwise specified: Secondary | ICD-10-CM

## 2022-10-13 DIAGNOSIS — E039 Hypothyroidism, unspecified: Secondary | ICD-10-CM

## 2022-10-13 DIAGNOSIS — G4730 Sleep apnea, unspecified: Secondary | ICD-10-CM

## 2022-10-13 DIAGNOSIS — K219 Gastro-esophageal reflux disease without esophagitis: Secondary | ICD-10-CM

## 2022-10-13 DIAGNOSIS — Z8659 Personal history of other mental and behavioral disorders: Secondary | ICD-10-CM

## 2022-10-13 DIAGNOSIS — D352 Benign neoplasm of pituitary gland: Secondary | ICD-10-CM

## 2022-10-13 DIAGNOSIS — E213 Hyperparathyroidism, unspecified: Secondary | ICD-10-CM

## 2022-10-13 DIAGNOSIS — F4310 Post-traumatic stress disorder, unspecified: Secondary | ICD-10-CM

## 2022-10-13 NOTE — Patient Instructions
To help prevent recurrence of kidney stones, please follow the following instructions:    High fluid intake (> 3000 mL/ day; 3 liters/ day)  At least ten 10 oz glasses/ day  Sodium restriction  Avoid salty foods & using the salt shaker  2000 - 3000 mg sodium (Na)/ day  Moderate Calcium Intake  600 - 1100 calcium (Ca) mg/ day  Oxalate Restriction  Avoid nuts, spinach, chocolate, tea, potatoes, rhubarb, vitamin C supplements  Helpful resource:  www.FunnyWorkshops.no.pdf  Avoid excessive animal protein intake  3 - 7 ounces/ day  Increase potassium-rich citrus product intake  Citric fruits & fruit juices (ex: oranges, lemons, limes, grapefuit)  Caution: grapefruit & grapefruit juice may interfere with the metabolism of many prescription medications, so discuss your medication list with you primary care provider prior to starting a grapefruit regimen.)

## 2022-10-13 NOTE — Progress Notes
History      CC: Nephrolithiasis.    HPI: Holly Maldonado is a 62 y.o. female with past medical history of nephrolithiasis and medullary sponge kidney disease.  Nephrolithiasis: First stone episode was 22 years ago. Stone was not analyzed.  Most recent stones were found incidentally on renal ultrasound from 09/01.  Fluid intake:3-4 glasses daily.  Salt: does not add excess salt.  Dairy: does not drink milk, eats cheese.  Surgeries: none  Denies hx of chronic diarrhea.  Interval history: no significant health events. She has not passed any kidney stone.  She denies flank pain. She admits she needs to increase her water intake.     Interval history 01-27-2022: She has not passed any kidney stone. Denies flank / abdominal pain.  Fluid: 30-40 fl oz. Sometimes a cup of coffee.  Salt/ dairy intake unchanged.    Interval history 04-28-22: She has increased her fluid intake and drinks about 60-90 fl oz of water. No passage of kidney stone since her last visit.      Interval history 10-13-2022: No major interval health events. She has not passed any kidney stone since the last appointment. She completed 24 hour urine collection in the last 2 weeks and her results are pending.  She drinks more than 46fl oz of water daily.         Past Medical History   Past Medical History:   Diagnosis Date    Adrenal nodule (HCC)     Left    Arrhythmia     multiple PVCs from EKG 08/14/12    depression     Dyslipidemia     Endometriosis     Esophageal reflux     H/O eating disorder     Hematuria     microscopic    High cholesterol     Hypothyroid     Medullary sponge kidney     Mental disorder     Nephrocalcinosis     Obstructive sleep apnea 2003    Pituitary adenoma (HCC)     2014    PTSD (post-traumatic stress disorder)     Recurrent UTI     Seasonal allergic reaction 2021    Sleep apnea     Tachycardia         Family History  Family History   Problem Relation Name Age of Onset    Diabetes Type II Mother Chales Abrahams     Cancer Mother Chales Abrahams leukemia    Diabetes Mother Chales Abrahams     Heart Attack Mother Chales Abrahams         Died at 66 from heart attack    Coronary Artery Disease Father Louanne Skye     None Reported Brother wade     None Reported Daughter vanessa     None Reported Daughter [place]     None Reported Daughter allysan     None Reported Son Jonny Ruiz     None Reported Son lane     Coronary Artery Disease Maternal Grandmother Barbara     Circulatory problem Maternal Grandmother Britta Mccreedy     Coronary Artery Disease Maternal Grandfather Ray     Heart Attack Maternal Grandfather Ray     Heart Surgery Maternal Grandfather Ray     Coronary Artery Disease Paternal Bobette Mo     Coronary Artery Disease Paternal Lisa Roca     Cancer-Breast Maternal Aunt  40    Glaucoma Neg Hx      Macular Degen Neg  Hx      Strabismus Neg Hx      Retinal Detachment Neg Hx      Blindness Neg Hx      Cataract Neg Hx      Amblyopia Neg Hx          Social History  Social History     Socioeconomic History    Marital status: Divorced    Number of children: 5   Tobacco Use    Smoking status: Former     Current packs/day: 0.00     Average packs/day: 1 pack/day for 7.0 years (7.0 ttl pk-yrs)     Types: Cigarettes     Start date: 08/22/1985     Quit date: 08/22/1992     Years since quitting: 30.1    Smokeless tobacco: Never   Vaping Use    Vaping status: Never Used   Substance and Sexual Activity    Alcohol use: No    Drug use: No    Sexual activity: Not Currently     Partners: Male     Birth control/protection: Post-menopausal        Medication    HOME MEDS   aspirin EC (ASPIR-LOW) 81 mg tablet Take one tablet by mouth daily.    bempedoic acid-ezetimibe (NEXLIZET) 180-10 mg tablet Take one tablet by mouth daily.    docosahexaenoic acid/epa (FISH OIL PO) Take 3 capsules by mouth daily.    famotidine (PEPCID) 40 mg tablet Take one tablet by mouth twice daily.    flecainide (TAMBOCOR) 100 mg tablet Take one-half tablet by mouth twice daily.    FLUoxetine (PROZAC) 20 mg capsule Take one capsule by mouth daily. Take one capsule (20mg ) by mouth daily with additional two 40mg  capsules (80mg ) for total daily dose of 100mg .    FLUoxetine (PROZAC) 40 mg capsule Take two capsules by mouth daily. Take two capsules (80mg ) by mouth daily with one 20mg  capsule for total daily dose of 100mg .    lamoTRIgine (LAMICTAL) 100 mg tablet Take two tablets by mouth daily.    levothyroxine (SYNTHROID) 88 mcg tablet Take one tablet by mouth daily 30 minutes before breakfast.    magnesium oxide (MAG-OX) 400 mg (241.3 mg magnesium) tablet Take three tablets by mouth at bedtime daily. 1,200 mg = 3 tablets    other medication CITRUS BERGAMOT supplement. 1 tablet in the morning  Indications: for cholesterol    potassium citrate (UROCIT-K 10) 10 mEq (1,080 mg) tablet TAKE TWO TABLETS BY MOUTH TWICE DAILY. TAKE WITH FOOD.    prednisolone acetate (PRED FORTE) 1 % ophthalmic suspension Apply one drop to right eye as directed four times daily. Wait to start until after eye surgery    rosuvastatin (CRESTOR) 10 mg tablet Take one tablet by mouth daily.          Review of Systems  Constitutional: negative  Eyes: negative  Ears, nose, mouth, throat, and face: negative  Respiratory: negative  Cardiovascular: negative  Gastrointestinal: negative  Genitourinary:negative  Integument/breast: negative  Hematologic/lymphatic: negative  Musculoskeletal:negative  Neurological: negative  Endocrine: negative      Physical Exam        Vitals:    10/13/22 1031   BP: 103/75   Pulse: 77   Weight: 81.2 kg (179 lb)         Body mass index is 30.73 kg/m?.    Gen: Alert and Oriented, No Acute Distress   HEENT: Sclera normal; MMM  CV:  S1 and S2  normal, no rubs, murmurs or gallops   Pulm: Clear to Auscultation bilateral   GI: BS+ x4, non-tender to palpation  Neuro: Grossly normal, moving all extremities, speech intact  Ext: no edema, clubbing or cyanosis   Skin: no rash     Assessment and Plan        Holly Maldonado is a 62 y.o. female        -Nephrolithiasis: Passed  stone more than 20 years ago.  Most recent CT scan shows several 2 to 3 mm right renal calculi.  Her initial  24-hour urine collection was significant for hypercalciuria and elevated urine sodium.  I started her on chlorthalidone 12.5 mg daily. Her urine calcium  improved but we had to stop it on account of hypercalcemia and hyperparathyroidism.  She was started on potassium citrate in 04/2022.  24 hour urine calcium is stable at 204mg /dl. Urine citrate has improved. As expected urine pH has also improved. SSCaOx and CaP have however increased.  She is on magnesium supplement which may sometimes increase urine calcium.I discussed with her to decrease the dose of MgO2 from 3 tabs daily to 2 tabs.  We will obtain a low dose CT to quantify her kidney stone burden.   24 hour urine collection prior to  1 year appointment        - Hyperparathyroidism: PTH is elevated with normal vitamin D, phosphate and calcium at upper limit of normal.  Of note had 24-hour urine calcium in November 2022 was 375 mg per 24 hours which has improved to 219mg /24 hours.  IShe has history of pituitary adenoma, which resolved without treatment. 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  She is hypothyroid.   Her PTH has increased again. I sent a message to her endocrinologist regarding genetic testing.    - Medullary sponge kidney disease: We do not have access to the primary radiological diagnosis. eGFR is preserved at this point. Continue to monitor.    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    Doran Durand, MD      Patient Instructions     To help prevent recurrence of kidney stones, please follow the following instructions:    High fluid intake (> 3000 mL/ day; 3 liters/ day)  At least ten 10 oz glasses/ day  Sodium restriction  Avoid salty foods & using the salt shaker  2000 - 3000 mg sodium (Na)/ day  Moderate Calcium Intake  600 - 1100 calcium (Ca) mg/ day  Oxalate Restriction  Avoid nuts, spinach, chocolate, tea, potatoes, rhubarb, vitamin C supplements  Helpful resource:  www.FunnyWorkshops.no.pdf  Avoid excessive animal protein intake  3 - 7 ounces/ day  Increase potassium-rich citrus product intake  Citric fruits & fruit juices (ex: oranges, lemons, limes, grapefuit)  Caution: grapefruit & grapefruit juice may interfere with the metabolism of many prescription medications, so discuss your medication list with you primary care provider prior to starting a grapefruit regimen.)             12months

## 2022-10-14 ENCOUNTER — Encounter: Admit: 2022-10-14 | Discharge: 2022-10-14 | Payer: No Typology Code available for payment source

## 2022-10-14 DIAGNOSIS — E210 Primary hyperparathyroidism: Secondary | ICD-10-CM

## 2022-10-18 ENCOUNTER — Encounter: Admit: 2022-10-18 | Discharge: 2022-10-18 | Payer: No Typology Code available for payment source

## 2022-10-18 NOTE — Progress Notes
Appointment Notes:  primary HPTH  Future Appointments   Date Time Provider Department Center   11/19/2022 11:30 AM Velta Addison, MD [facility]   12/03/2022 11:15 AM [name], MD [facility]   12/24/2022  3:00 PM Samella Parr, OD [facility]   01/04/2023  4:00 PM Velta Addison, MD [facility]   03/14/2023  1:00 PM Lucrezia Starch, MD [facility]   03/31/2023 10:45 AM Johny Chess, Wendee Beavers, MD [facility]   10/14/2023 10:40 AM Doran Durand, MD [facility] IM     Referring Provider: Dr. Janalyn Rouse    Location of Films: IN HOUSE     Location of Labs: In house    Contact Summary:    63 y/o female with HPTH. pt is est with [facility] Dr. Judye Bos, see ovn for complete HPI.    06/02/22 US thyroid ( in house)    Allergies reviewed and verified with the patient, and documented in Epic:  Yes    Prior Treatment (XRT, Surgery, Chemotherapy):  none    Comments:  records in [facility]

## 2022-11-02 ENCOUNTER — Encounter: Admit: 2022-11-02 | Discharge: 2022-11-02 | Payer: No Typology Code available for payment source

## 2022-11-03 ENCOUNTER — Encounter: Admit: 2022-11-03 | Discharge: 2022-11-03 | Payer: No Typology Code available for payment source

## 2022-11-03 NOTE — Telephone Encounter
Patient has been contacted to discuss her concern with FBS/irritation with OD.  No vision changes, no dark spots, no flashes, no pain, no light sensitivity.    Hx of phaco with Dr Loma Boston.  It was discussed that she can use Pataday, Olopadine or Zaditor for allergy itchiness  We cans also suggest using ATs for better comfort, to be used consistently (QID to 6 times daily) Patient to contact our clinic if symptoms change or If she has any other concerns.   No further action required today.

## 2022-11-19 ENCOUNTER — Ambulatory Visit: Admit: 2022-11-19 | Discharge: 2022-11-20 | Payer: MEDICARE

## 2022-11-19 ENCOUNTER — Encounter: Admit: 2022-11-19 | Discharge: 2022-11-19 | Payer: No Typology Code available for payment source

## 2022-11-19 DIAGNOSIS — E7800 Pure hypercholesterolemia, unspecified: Secondary | ICD-10-CM

## 2022-11-19 DIAGNOSIS — E785 Hyperlipidemia, unspecified: Secondary | ICD-10-CM

## 2022-11-19 DIAGNOSIS — F4310 Post-traumatic stress disorder, unspecified: Secondary | ICD-10-CM

## 2022-11-19 DIAGNOSIS — E039 Hypothyroidism, unspecified: Secondary | ICD-10-CM

## 2022-11-19 DIAGNOSIS — G4733 Obstructive sleep apnea (adult) (pediatric): Secondary | ICD-10-CM

## 2022-11-19 DIAGNOSIS — R000 Tachycardia, unspecified: Secondary | ICD-10-CM

## 2022-11-19 DIAGNOSIS — Z8659 Personal history of other mental and behavioral disorders: Secondary | ICD-10-CM

## 2022-11-19 DIAGNOSIS — N390 Urinary tract infection, site not specified: Secondary | ICD-10-CM

## 2022-11-19 DIAGNOSIS — I499 Cardiac arrhythmia, unspecified: Secondary | ICD-10-CM

## 2022-11-19 DIAGNOSIS — D352 Benign neoplasm of pituitary gland: Secondary | ICD-10-CM

## 2022-11-19 DIAGNOSIS — E278 Other specified disorders of adrenal gland: Secondary | ICD-10-CM

## 2022-11-19 DIAGNOSIS — Q615 Medullary cystic kidney: Secondary | ICD-10-CM

## 2022-11-19 DIAGNOSIS — G4730 Sleep apnea, unspecified: Secondary | ICD-10-CM

## 2022-11-19 DIAGNOSIS — F32A Depressed: Secondary | ICD-10-CM

## 2022-11-19 DIAGNOSIS — J302 Other seasonal allergic rhinitis: Secondary | ICD-10-CM

## 2022-11-19 DIAGNOSIS — G4711 Idiopathic hypersomnia with long sleep time: Secondary | ICD-10-CM

## 2022-11-19 DIAGNOSIS — K219 Gastro-esophageal reflux disease without esophagitis: Secondary | ICD-10-CM

## 2022-11-19 DIAGNOSIS — F99 Mental disorder, not otherwise specified: Secondary | ICD-10-CM

## 2022-11-19 NOTE — Assessment & Plan Note
We will try to obtain copies of her previous testing at [facility] as above.  I suspect her current daytime sleepiness may be related to her hypersomnia rather than sleep disordered breathing.  She is doing better with Inspire therapy but does not necessarily feel any more rested and her daytime sleepiness remains unchanged.  We may consider Sunosi or Wakix given she has not used any of these medications.

## 2022-11-19 NOTE — Telephone Encounter
Contacted pt to bring inspire remote for upcoming appointment.Patient will bring  inspire remote.

## 2022-11-19 NOTE — Progress Notes
Subjective:       History of Present Illness  Holly Maldonado is a 62 y.o. female.  She was last seen in April.    Since that time her jaw pain and sensitivity to device is better.    She has gone up 6 steps since that time and tolerates it fairly well.  She was planning on going up one more step.  She never skips intentionally but has apparently turned on then back off before laying down and never noticed it until the next day.    Despite using at this new setting she feels essentially the same.   She isn't sharing the bed so unsure of change in snoring.  She did go on trip to [place] last month and no one complained about her snoring so thinks this may be better.   When she wakes up she feels very tired still and could go back to bed.    She is actually only sleeping about 8 hours per night whereas she was sleeping about 10 hours per night so this has improved.    She is able to get up to use restroom and typically does not need to pause the device.   Overall she is doing great with usage and tolerating the device.    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    She has started a part time job and will fall asleep at her desk.  She was started on lamictal about 18 months ago and feels this has not changed her sleepiness.    She thinks she had a MSLT in 2018 at [facility] by Dr. 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  She was given Provigil but had mood changes and has used Vyvanse and Adderal for ADHA in the past but can't take now due to PVC's and other cardiac rhythm issues.  She saw him for a couple years but eventually stopped going to his clinic.  She never tried [place] or [employer].    Her Epworth is 21/24.         Review of Systems  Per HPI    Objective:          aspirin EC (ASPIR-LOW) 81 mg tablet Take one tablet by mouth daily.    bempedoic acid-ezetimibe (NEXLIZET) 180-10 mg tablet Take one tablet by mouth daily. docosahexaenoic acid/epa (FISH OIL PO) Take 3 capsules by mouth daily.    famotidine (PEPCID) 40 mg tablet Take one tablet by mouth twice daily.    flecainide (TAMBOCOR) 100 mg tablet Take one-half tablet by mouth twice daily.    FLUoxetine (PROZAC) 20 mg capsule Take one capsule by mouth daily. Take one capsule (20mg ) by mouth daily with additional two 40mg  capsules (80mg ) for total daily dose of 100mg .    FLUoxetine (PROZAC) 40 mg capsule Take two capsules by mouth daily. Take two capsules (80mg ) by mouth daily with one 20mg  capsule for total daily dose of 100mg .    lamoTRIgine (LAMICTAL) 100 mg tablet Take two tablets by mouth daily.    levothyroxine (SYNTHROID) 88 mcg tablet Take one tablet by mouth daily 30 minutes before breakfast.    magnesium oxide (MAG-OX) 400 mg (241.3 mg magnesium) tablet Take three tablets by mouth at bedtime daily. 1,200 mg = 3 tablets    other medication CITRUS BERGAMOT supplement. 1 tablet in the morning  Indications: for cholesterol    potassium citrate (UROCIT-K  10) 10 mEq (1,080 mg) tablet TAKE TWO TABLETS BY MOUTH TWICE DAILY. TAKE WITH FOOD.    prednisolone acetate (PRED FORTE) 1 % ophthalmic suspension Apply one drop to right eye as directed four times daily. Wait to start until after eye surgery    rosuvastatin (CRESTOR) 10 mg tablet Take one tablet by mouth daily.     Vitals:    11/19/22 1111   BP: 114/71   BP Source: Arm, Right Upper   Pulse: 76   Resp: 20   SpO2: 98%   PainSc: Zero   Weight: 81.6 kg (180 lb)   Height: 162.6 cm (5' 4)     Body mass index is 30.9 kg/m?Marland Kitchen     Physical Exam  Constitutional:       Appearance: Normal appearance. She is obese.   HENT:      Mouth/Throat:      Mouth: Mucous membranes are moist.      Pharynx: Oropharynx is clear.   Neck:      Comments: Well-healed right neck incision.  Cardiovascular:      Rate and Rhythm: Normal rate and regular rhythm.      Heart sounds: Normal heart sounds.   Pulmonary:      Effort: Pulmonary effort is normal. Breath sounds: Normal breath sounds.      Comments: Well-healed right subclavicular generator noted.  Abdominal:      General: Bowel sounds are normal.      Palpations: Abdomen is soft.   Musculoskeletal:      Cervical back: Normal range of motion.   Neurological:      Mental Status: She is alert.              Assessment and Plan:    Problem   Idiopathic Hypersomnia    She has a long history of daytime sleepiness and was previously seen by Dr. Ellwood Handler at [facility] around 2018.  She reports MSLT at that time did not show narcolepsy but was consistent with idiopathic hypersomnia.  She was treated with Nuvigil, Provigil and she believes perhaps even stimulants but had issues with tachycardia, PVCs and other rhythm issues therefore discontinued entirely.     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    08/20/22  We noticed bulk movement is significant and seen much sooner than what we would call FT.  Therefore, we based FT assessment on BM and Teeth:     ( + - + ) BM - 1.4v, Teeth 1.9V.       ( 0 - 0 ) BM - 0.7v, teeth 1.3v.      Her incoming setting was 1.3V on (0 - 0) and complained of pain with stimulation at jaw line.  Patient tolerated default better so we switched back and started her at 1.6v with range of 1.4 - 2.4.      Current settings:  Sensitive threshold:  Functional threshold:  Current amplitude: 2.2 V  Range: 1.4-2.4 V  Configuration: off -off         Idiopathic hypersomnia  We will try to obtain copies of her previous testing at [name] as above.  I suspect her current daytime sleepiness may be related to her hypersomnia rather than sleep disordered breathing.  She is doing better with  Inspire therapy but does not necessarily feel any more rested and her daytime sleepiness remains unchanged.  We may consider Sunosi or Wakix given she has not used any of these medications.    OSA (obstructive sleep apnea)  Her download showed usage for 8 hours 23 minutes nightly with less than 1 pause per night.  She is using her device 26 of 30 days.  Even though her device usage is outstanding she does not necessarily feel much different.  However, I believe this may be related to her idiopathic hypersomnia rather than her sleep apnea.  Her waveform during the visit looked intact and her functional status looked good.  We will increase her to step 10 or 2.3 V for now and tentatively plan on a home sleep test later this month.  If her apnea looks well-controlled we will likely pursue treatment for her idiopathic hypersomnia and continue her current settings.  However she still has significant apnea then awake endoscopy should be considered.  We will see her back in 3 months.

## 2022-11-19 NOTE — Assessment & Plan Note
Her download showed usage for 8 hours 23 minutes nightly with less than 1 pause per night.  She is using her device 26 of 30 days.  Even though her device usage is outstanding she does not necessarily feel much different.  However, I believe this may be related to her idiopathic hypersomnia rather than her sleep apnea.  Her waveform during the visit looked intact and her functional status looked good.  We will increase her to step 10 or 2.3 V for now and tentatively plan on a home sleep test later this month.  If her apnea looks well-controlled we will likely pursue treatment for her idiopathic hypersomnia and continue her current settings.  However she still has significant apnea then awake endoscopy should be considered.  We will see her back in 3 months.

## 2022-12-02 ENCOUNTER — Encounter: Admit: 2022-12-02 | Discharge: 2022-12-02 | Payer: No Typology Code available for payment source

## 2022-12-02 ENCOUNTER — Ambulatory Visit: Admit: 2022-12-02 | Discharge: 2022-12-02 | Payer: MEDICARE

## 2022-12-02 DIAGNOSIS — G4733 Obstructive sleep apnea (adult) (pediatric): Secondary | ICD-10-CM

## 2022-12-06 ENCOUNTER — Encounter: Admit: 2022-12-06 | Discharge: 2022-12-06 | Payer: No Typology Code available for payment source

## 2022-12-06 DIAGNOSIS — G4733 Obstructive sleep apnea (adult) (pediatric): Secondary | ICD-10-CM

## 2022-12-09 ENCOUNTER — Encounter: Admit: 2022-12-09 | Discharge: 2022-12-09 | Payer: No Typology Code available for payment source

## 2022-12-09 NOTE — Telephone Encounter
Sleep study and sleep records received from [facility]. placed into scan on base folder for uploading to patients chart. Sent to Dr. Andria Meuse for review.

## 2022-12-13 ENCOUNTER — Encounter: Admit: 2022-12-13 | Discharge: 2022-12-13 | Payer: No Typology Code available for payment source

## 2022-12-24 ENCOUNTER — Encounter: Admit: 2022-12-24 | Discharge: 2022-12-24 | Payer: MEDICARE

## 2022-12-29 ENCOUNTER — Encounter: Admit: 2022-12-29 | Discharge: 2022-12-29 | Payer: MEDICARE

## 2022-12-29 ENCOUNTER — Ambulatory Visit: Admit: 2022-12-29 | Discharge: 2022-12-29 | Payer: MEDICARE

## 2022-12-29 DIAGNOSIS — Q615 Medullary cystic kidney: Secondary | ICD-10-CM

## 2022-12-29 DIAGNOSIS — F1020 Alcohol dependence, uncomplicated: Secondary | ICD-10-CM

## 2022-12-29 DIAGNOSIS — F99 Mental disorder, not otherwise specified: Secondary | ICD-10-CM

## 2022-12-29 DIAGNOSIS — N390 Urinary tract infection, site not specified: Secondary | ICD-10-CM

## 2022-12-29 DIAGNOSIS — Z8659 Personal history of other mental and behavioral disorders: Secondary | ICD-10-CM

## 2022-12-29 DIAGNOSIS — E785 Hyperlipidemia, unspecified: Secondary | ICD-10-CM

## 2022-12-29 DIAGNOSIS — D539 Nutritional anemia, unspecified: Secondary | ICD-10-CM

## 2022-12-29 DIAGNOSIS — F32A Depressed: Secondary | ICD-10-CM

## 2022-12-29 DIAGNOSIS — M1990 Unspecified osteoarthritis, unspecified site: Secondary | ICD-10-CM

## 2022-12-29 DIAGNOSIS — J302 Other seasonal allergic rhinitis: Secondary | ICD-10-CM

## 2022-12-29 DIAGNOSIS — R000 Tachycardia, unspecified: Secondary | ICD-10-CM

## 2022-12-29 DIAGNOSIS — E213 Hyperparathyroidism, unspecified: Secondary | ICD-10-CM

## 2022-12-29 DIAGNOSIS — F4310 Post-traumatic stress disorder, unspecified: Secondary | ICD-10-CM

## 2022-12-29 DIAGNOSIS — Z720 Tobacco use: Secondary | ICD-10-CM

## 2022-12-29 DIAGNOSIS — G4730 Sleep apnea, unspecified: Secondary | ICD-10-CM

## 2022-12-29 DIAGNOSIS — E039 Hypothyroidism, unspecified: Secondary | ICD-10-CM

## 2022-12-29 DIAGNOSIS — K219 Gastro-esophageal reflux disease without esophagitis: Secondary | ICD-10-CM

## 2022-12-29 DIAGNOSIS — I499 Cardiac arrhythmia, unspecified: Secondary | ICD-10-CM

## 2022-12-29 DIAGNOSIS — E278 Other specified disorders of adrenal gland: Secondary | ICD-10-CM

## 2022-12-29 DIAGNOSIS — D352 Benign neoplasm of pituitary gland: Secondary | ICD-10-CM

## 2022-12-29 DIAGNOSIS — G4733 Obstructive sleep apnea (adult) (pediatric): Secondary | ICD-10-CM

## 2022-12-29 DIAGNOSIS — E7800 Pure hypercholesterolemia, unspecified: Secondary | ICD-10-CM

## 2022-12-29 NOTE — Patient Instructions
How to contact Dr. Buczek's/Dr. Bur's nurse, Maylea Soria:  Clinic phone number [phone]  Clinic fax number [phone]  Email- [email or web address]  You may also send messages through MyChart     Head and Neck Endocrine Glands    Thyroid Gland     The thyroid gland is a butterfly shaped endocrine organ that produces and releases Thyroid hormones (T3 & T4) from the lower midline portion of your neck.     Thyroid hormones are responsible for performing a variety of functions, including:    Overall Body Homeostasis Metabolism (Contribute to Weight Gain/Loss)   Heart Rate & Heart Contractability Respiratory Rate    Reproductive Health Body Temperature   Bone Growth and Development Mood     parathyroid glands     The parathyroid glands are endocrine organs that produce and release parathyroid hormone (PTH). There are four parathyroid glands (2 on each side) located on the back surface of your thyroid gland.    Parathyroid hormone is responsible for maintaining the correct amount of calcium in the body.   If one or more gland produces too much PTH = elevated calcium - too little PTH = lower calcium.    Elevated calcium in the bloodstream can lead to memory fog, fatigue, kidney stones, excess urination, osteoporosis and abnormal bone fractures, abdominal pain, nausea/vomiting, constipation, mood fluctuations/depression, body aches/muscle pain.     Low calcium can lead to tingling/numbness around the mouth and finger tips, muscle spasms, heart arrhythmiax, and coma.

## 2023-01-03 ENCOUNTER — Ambulatory Visit: Admit: 2023-01-03 | Discharge: 2023-01-03 | Payer: MEDICARE

## 2023-01-03 ENCOUNTER — Encounter: Admit: 2023-01-03 | Discharge: 2023-01-03 | Payer: MEDICARE

## 2023-01-03 DIAGNOSIS — E213 Hyperparathyroidism, unspecified: Secondary | ICD-10-CM

## 2023-01-03 DIAGNOSIS — N200 Calculus of kidney: Secondary | ICD-10-CM

## 2023-01-03 LAB — POC CREATININE, RAD: CREATININE, POC: 1.3 mg/dL — ABNORMAL HIGH (ref 0.4–1.00)

## 2023-01-03 MED ORDER — SODIUM CHLORIDE 0.9 % IJ SOLN
50 mL | Freq: Once | INTRAVENOUS | 0 refills | Status: CP
Start: 2023-01-03 — End: ?
  Administered 2023-01-03: 16:00:00 50 mL via INTRAVENOUS

## 2023-01-03 MED ORDER — IOHEXOL 350 MG IODINE/ML IV SOLN
80 mL | Freq: Once | INTRAVENOUS | 0 refills | Status: CP
Start: 2023-01-03 — End: ?
  Administered 2023-01-03: 16:00:00 80 mL via INTRAVENOUS

## 2023-01-04 NOTE — Progress Notes
NAME: Holly Maldonado  MRN: [name]# [id number]  DOB: 06-20-1960    [facility]   Genetic Counseling  Visit Date: 01/05/2023     Referring physician: [name], MD    [name], 62 y.o., was contacted today to discuss hereditary cancer genetic testing regarding her diagnosis of primary hyperparathyroidism, adrenal tumor, and pituitary adenoma and family history of cancer. Holly Maldonado was not accompanied by anyone for today's discussion. Holly Maldonado confirmed that she is currently physically located in [place] for today's appointment. The patient's verbal consent was obtained to provide this visit via phone call.    Indication  Diagnosis of primary hyperparathyroidism, adrenal tumor, and pituitary adenoma  Family history of cancer (breast, hematologic, prostate, lung, skin)    Summary of Session   Based on NCCN guidelines, Holly Maldonado meets the clinical criteria required for coverage of a multi-gene panel test, thus Holly Maldonado was recommended to have genetic testing. Her genetic testing results could affect her clinical management recommendations.  After discussing the risks, benefits, and limitations of genetic testing, [name] elected to proceed with testing. We ordered the following test:   Holly Maldonado' CustomNext-Cancer? +RNAinsight? (85 genes)   PLAN: The intake coordination team will call the patient to discuss scheduling a blood draw. Orders in the genetic testing lab portal will be placed with the referring provider as the ordering provider. Results will be discussed with the patient via phone call and/or an upload to the chart when they are available (~3-4 week post blood draw). Medical management recommendations will be made based on the result of the genetic analysis and the patient's personal and family history.     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 She has been followed by both endocrinology and nephrology. Her PTH and calcium had waxed and waned in the past but now she's had high-normocalcemic HPTH on the past 2 lab checks. Urine calcium has been stable, if not slightly better. Recommend formal evaluation for MEN 1 given her history since this could impact surgical extent.     Multiple Endocrine Neoplasia (MEN) Screening Questions  Primary hyperparathyroidism: Yes  Parathyroid tumors: No  Pituitary tumors: Yes  Endocrine tumors of the GI system / pancreas: No  Carcinoid tumors (especially in the thymus and lungs): No  Adrenocortical tumors (tumors in glands atop of the kidneys): Yes   Thyroid cancer (medullary): No   Pheochromocytoma: No     Selected / Pertinent Patient Active Problem List    Diagnosis Date Noted    Breast pain 02/11/2022     DIAGNOSIS:  Bilateral breast pain      HISTORY:  Holly Maldonado is a female who presented to the [facility] on 02/11/2022 at age 63 for bilateral breast pain.  She reports intermittent breast pain for about 2 months, specifically nipple burning.  She reports that she had been using an Estrogen patch and the dose was increased in early 2023.  She felt that this was contributing to some pain at the time and she decided to discontinue in spring 2023.  She has recently been monitored closely for thyroid and parathyroid issues, she had and adrenal gland removed in 07/2021.  She had screening mammogram in 07/2021 in [place] which was benign.  She denies having prior  breast issues or procedures.    BREAST IMAGING:  Mammogram:    - Screening mammogram 07/21/2021 [name]) revealed scattered fibroglandular densities.  No concerning masses or calcifications were seen.       REPRODUCTIVE HEALTH:  Age at first Menarche:  60  Age at First Live Birth:  38  Age at Menopause:  15 (HRT x 5 years)  Gravida:  6  Para: 5  Breastfeeding: yes    PROCEDURE:   PERTINENT PMH:  Chronic Bulimia, hyperparathyroidism, CAD, GERD, Sleep apnea, h/p pituitary adenoma  FAMILY HISTORY:  Maternal Aunt- Breast cancer.  No family history of ovarian or prostate cancer.   PHYSICAL EXAM on PRESENTATION:    MEDICAL ONCOLOGY:      REFERRED BY:   Holly Maldonado      Hyperparathyroidism (HCC) 02/02/2022    Adrenal mass (HCC) 07/31/2021     08/14/2019 - Calcium Score CT:  The total calcium score is 0. This represents the <10th percentile rank for females age 77-59 years old.       Pituitary adenoma (HCC) 09/04/2018     Patient Reported Cancer Surveillance History:  Surveillance Screening     Breast cancer: Hx of abnl imaging? Phx of scattered fibroglandular densities    Breast biopsies? None    Gynecological cancer: Uterus/ovaries intact? S/p hysterectomy Jan 2010, ovaries remain      Hx of abnl Pap smear?  Yes in 1997 (precancerous, cryotherapy)   Gastrointestinal cancer: Colonoscopies? Yes   Lifetime # of polyps: None per recollection     EGDs? Yes    Dermatologic cancer: Full body skin exams? Patient has not had a screening    Other: NA       Past Medical History:   Diagnosis Date    Adrenal nodule (HCC)     Left    Alcoholism (HCC) 1979    Haven?t drank since 1980    Arrhythmia     multiple PVCs from EKG 08/14/12    Arthritis 2019    Knee    depression     Dyslipidemia     Endometriosis     Esophageal reflux     H/O eating disorder     Hematuria     microscopic    High cholesterol     Hypothyroid     Medullary sponge kidney     Mental disorder     Nephrocalcinosis     Obstructive sleep apnea 2003    Pituitary adenoma (HCC)     2014    PTSD (post-traumatic stress disorder)     Recurrent UTI     Seasonal allergic reaction 2021    Sleep apnea     Tachycardia     Tobacco abuse 1975    Unspecified deficiency anemia     When pregnant    There were no other major medical concerns reported at today's appointment.      Surgical History:   Procedure Laterality Date    HX TUBAL LIGATION  2010    PR NEURECTOMY HAMSTRING MUSCLE Left 2018    COLONOSCOPY DIAGNOSTIC WITH SPECIMEN COLLECTION BY BRUSHING/ WASHING - FLEXIBLE N/A 12/15/2018    Performed by Veneta Penton, MD at [facility] OR    ESOPHAGOGASTRODUODENOSCOPY WITH BIOPSY - FLEXIBLE N/A 05/06/2021    Performed by Buckles, Vinnie Level, MD at [facility] OR    DRUG-INDUCED SLEEP ENDOSCOPY WITH DYNAMIC EVALUATION OF VELUM/ PHARYNX/ TONGUE BASE/ AND LARYNX FOR EVALUATION OF SLEEP-DISORDERED BREATHING - FLEXIBLE -DIAGNOSTIC  N/A 06/18/2021    Performed by Lurline Idol, MD at [facility] OR    ROBOT ASSISTED LAPAROSCOPIC ADRENALECTOMY Left 07/31/2021    Performed by Willette Cluster, MD at [facility] OR    OPEN IMPLANTATION HYPOGLOSSAL NERVE NEUROSTIMULATOR ARRAY/ PULSE GENERATOR/ DISTAL RESPIRATORY SENSOR ELECTRODE/ ELECTRODE ARRAY Right 10/13/2021    Performed by Lurline Idol, MD at [facility] OR    MANUAL/ MECHANICAL EXTRACAPSULAR CATARACT REMOVAL WITH INSERTION INTRAOCULAR LENS PROSTHESIS - 1 STAGE Right 09/14/2022    Performed by Gilmore Laroche, MD at [facility] OR    ELECTROCARDIOGRAM      HX BLADDER SUSPENSION      HX CESAREAN SECTION      HX HYSTERECTOMY      HX SINUS SURGERY      HX TONSILLECTOMY      LAPAROSCOPY        Cancer Family History   Problem Relation Name Age of Onset    Cancer-Hematologic Mother Chales Abrahams 15        CLL (chronic lymphocytic leukemia)    None Reported Daughter [place]         precancerous moles removed    None Reported Daughter Allysan         precancerous moles removed    Cancer-Prostate Paternal Grandfather Bernard 70        mets to bones    Cancer-Breast Maternal Aunt  60    Cancer-Lung Other Half aunt viaMGM, Kendal Hymen 55        SCCL mets to brain    Cancer-Lung Other Half uncle via MGM(2) 60        mets to brain    Cancer-Skin Other Half uncle via MGF         mets     A detailed, 4-generation family history was obtained using Epic's Pedigree / Family History Tool. Significant diagnoses are shown below:    Reported ancestry: White, no known Jewish ancestry.     Disclaimer: Cousins who were not reported to have a history of cancer were not included in the above family history information to minimize the length of the note/family history table. The information provided is based on the patient's recollection of the family history and in the absence of complete medical records. If the family history changes or if more information is obtained, the patient can contact our offices as this may alter the recommendations or impression of the family history.     Following today's appointment, the pedigree will additionally be scanned and uploaded to the Media tab in the patient's chart under document type Pedigree Image.    Discussion   Hereditary cancer syndrome risk assessment:  Most cancers are sporadic, meaning the cancer occurs by random chance or environmental causes. The second largest proportion of cancer diagnoses are familial, meaning shared environmental and hereditary factors cause cancer in multiple family members, but we are not able to identify one root cause. Hereditary cancers, caused by an inherited pathogenic variant (cancer-causing genetic change/mutation) make up the smallest proportion of all cancer diagnoses (in general, around 5-10% of cancer diagnoses). Inheriting a mutation does not automatically mean that one will develop cancer, rather that there is an increased chance for developing certain types of cancer.     Cancer develops through a variety of interactions between environmental and genetic risk factors. Hereditary cancers are typically identified through observing multiple family members with cancer through multiple generations of a family. Additionally, these cancers tend to have an earlier  age of diagnosis in comparison to the general population, often with a diagnosis of cancer prior to the age of 61 years old. Lastly, multiple primary cancers in the one individual and clusters of certain types of cancer seen in family members can be indicative of a potential genetic predisposition to cancer. Skylar's personal and family history is suggestive of a hereditary cancer predisposition.    Genetic education:   We discussed hereditary cancer syndromes involving the cancers in the family. The most pertinent cancer history differentials are listed below.   Multiple endocrine neoplasia type 1 (MEN1). MEN1 is caused by a genetic mutation (a gene change) in the MEN1 gene. A mutation in the MEN1 gene causes an increased risk of developing parathyroid tumors, pituitary thyroid tumors, well-differentiated endocrine tumors of the gastro-entero-pancreatic (GEP) tract, carcinoid tumors, and adrenocortical tumors. Since the MEN1 gene is autosomal dominant, someone with a MEN1 mutation has a 50% chance of passing on their pathogenic (tumor-causing) variant to their children regardless of the individual's sex.   Multiple endocrine neoplasia type 2A (MEN-2A). MEN-2A is caused by a genetic mutation (a gene change) in the RET gene. A mutation in the RET gene causes a high risk of developing medullary carcinoma of the thyroid (MTC), increased risk pheochromocytoma; and an increased risk for parathyroid adenoma or hyperplasia. Since the RET gene is autosomal dominant, someone with a RET mutation has a 50% chance of passing on their pathogenic (cancer causing) variant to their children regardless of the individual's sex.     It is important to note that the genes listed above are not the only genes that cause hereditary cancer syndromes which increases risk for cancer. Testing for moderate to high-risk cancer genes can inform the patient and at-risk family members of a potential familial pathogenic (cancer-causing) variant. Appropriate management and screening for individuals is not always clear for individuals with moderate-risk gene mutations. In some of these cases, even with a positive test result, medical management recommendations may still be based upon personal and family history.     Mareta was provided with a website link about hereditary cancer and genetic testing. This link can be found in the patient's chart under MyChart message Following Your Genetic Counseling Appointment.    Genetic testing guidelines and recommendations:   We discussed the [employer] (NCCN) guidelines regarding genetic testing for hereditary cancer syndromes. Based on NCCN guidelines, Toka meets the clinical criteria (MEN1/2: primary hyperparathyroidism, germline genetic testing needed for surgical decision making) required for coverage of a multi-gene panel test, thus Pecola was recommended to have genetic testing.     Types of genetic testing results and their medical management implications:   There are 3 types of results that can come back from a gene panel including: positive (a pathogenic (cancer-risk) genetic mutation was identified), negative (no mutations were identified), and a variant of uncertain significance (a mutation was identified, but the effect of that mutation is not known).   If the result is positive, the NCCN recommendations would include testing other individuals in the family for the same variant. The family's medical management may be changed if a pathogenic variant is found in other members of the family, such as increasing cancer screenings; however, not all gene mutations that are associated with cancer have clear management guidelines when a pathogenic variant is identified. In some of these cases, even with a positive test result, medical management recommendations may still be based upon personal and family history. If a mutation is detected, the  patient will be referred back to the referring provider and to any additional appropriate care providers to discuss the relevant options.   If the result is negative, known common hereditary cancer genetic mutations would be ruled out for the patient. This means that the patient's diagnosis may be sporadic (happened by chance) or familial (shared environmental and hereditary cause we are unable to identify as of right now).   The American Cancer Society (ACS) recommends preventative cancer screening for all individuals with consideration of a patient's personal and family history. For example, the patient would be recommended to follow ACS guidelines and any other screenings as per their medical team's recommendation.  If the result is a VUS, it means the laboratory does not have enough information to determine whether the alteration is a benign (harmless) genetic variant or if it is associated with an increased risk of cancer. As more information becomes available, some VUS are eventually reclassified, and research shows that if a VUS is reclassified approximately 90% will turn out to be negative/benign.    Cancer surveillance options would be discussed for the patient according to the appropriate standard NCCN and American Cancer Society guidelines, with consideration of their personal and family history risk factors. In this case, the patient will be referred back to their care providers for discussions of management.    Familial implications:   If the result is positive, there is a 50% chance to pass this pathogenic variant to each offspring. In addition, siblings have a 50% chance to have inherited the same pathogenic variant. Other family members may also be at risk of having inherited the familial pathogenic variant.  Inheriting a mutation does not automatically mean that one will develop cancer, rather that there is an increased chance for developing certain types of cancer.  Cancer predisposing gene mutations can exist in males and females and can be passed on to both sons and daughters.  If the result is negative, there is no identified hereditary cancer syndrome to pass to offspring; however, it does not exclude the possibility of a cancer-causing genetic mutation being in the family. The best way to determine this would be through genetic testing of a family member with cancer. Due to this, we would recommend that Chimamanda's father obtain genetic counseling and testing due to his father's history of metastatic prostate cancer.  If the result is a VUS, we do not recommend testing at-risk family members unless the VUS is later reclassified as a positive (pathogenic) result.    Psychological implications:   Psychological implications of genetic testing include family dynamics and concerns regarding confidentiality and insurance coverage.    Regarding cost of testing, Holly Maldonado will reach out to [facility] by texting the number [phone] if there is a cost over $100. The maximum (self-pay) out-of-pocket cost is $250 for the genetic testing. If Cheslea has any questions or concerns regarding billing, [facility] has a billing department that she can contact by calling [phone].    Federal laws (GINA, ACA) protect from medical insurance discrimination; however, it is important to note that employer with less than 15 employees so not have to comply with the [employer] Nondiscrimination Act of 2008. This private sector leads to about 15% of the [name] workforce not having federal-level protections against genetic discrimination in employment. Some individuals still have these protections as several states extend the employment protections to include businesses with fewer than fifteen employees. If you have specific questions, please reach out to a lawyer to discuss  your specific state laws. There are currently no federal laws in place which protect an individual who is found to have a hereditary cancer syndrome from discrimination for life insurance, long term care insurance, or disability insurance. For more information regarding potential insurance coverage please refer to [employer]      Decision about genetic testing:   After discussing the risks, benefits and limitations of undergoing genetic testing including the potential implications of test results on clinical management and the fact that the clinical use and understanding of some of these genes included on the multi-gene panel analysis is limited, the patient provided informed consent for the following test:    Holly Maldonado' CustomNext-Cancer? +RNAinsight?:  85 genes: AIP, ALK, APC, ATM, AXIN2, BAP1, BARD1, BLM, BMPR1A, BRCA1, BRCA2, BRIP1, CDC73, CDH1, CDK4, CDKN1B, CDKN2A, CHEK2, CTNNA1, DICER1, FANCC, FH, FLCN, GALNT12, KIF1B, LZTR1, MAX, MEN1, MET, MLH1, MRE11A, MSH2, MSH3, MSH6, MUTYH, NBN, NF1, NF2, NTHL1, PALB2, PHOX2B, PMS2, POT1, PRKAR1A, PTCH1, PTEN, RAD50, RAD51C, RAD51D, RB1, RECQL, RET, SDHA, SDHAF2, SDHB, SDHC, SDHD, SMAD4, SMARCA4, SMARCB1, SMARCE1, STK11, SUFU, TMEM127, TP53, TSC1, TSC2, VHL and XRCC2 (sequencing and deletion/duplication); EGFR, EGLN1, FAM175A, HOXB13, KIT, MITF, MLH3, PALLD, PDGFRA, POLD1, POLE, RINT1, RPS20 and TERT (sequencing only); EPCAM and GREM1 (deletion/duplication only). RNA data is routinely analyzed for use in variant interpretation for all genes.    Return of results:   Ronalyn and I discussed how she would like to receive her results.   I will call as soon as I have reviewed results and prepared recommendations as needed. A follow up appointment can be scheduled at that time per request.  I will upload Marguriete's results to her chart if they are non-actionable. Otherwise, if the results are actionable and/or complex, then I will call as soon as I have reviewed results and prepared recommendations as needed. A follow up appointment can be scheduled at that time per request.    [name] decided that she would like to be called with her results ONLY if they are actionable and/or complex to discuss medical implications of the result. Regardless of the result type, all testing result implications will be documented and sent to her via a Educational psychologist. If desired, a telephone call is always available for further discussion.     Plan    Orders for genetic testing will be placed in the lab's ordering portal with the referring provider as the ordering provider.   Bexlee will receive a call from the intake coordination team to schedule a blood draw.   It is the Mellonie's responsibility to have her blood drawn within the next 60 days as the lab order will expire after 60 days.    Once the blood is drawn, the sample will be sent to the testing lab for analysis.  When available, the results will then be discussed with the patient via phone call approximately 3-4 weeks after the sample is submitted if they are actionable and/or complex. If Rosali's results are non-actionable, the results will be uploaded to her chart. A follow up phone call is available if Shamari has any further questions. (If Meira has not received a copy with their genetic test results within 5 weeks of having their blood drawn, she is encouraged to reach out to her genetic counselor, Lamona Curl, MS CGC, directly or contact the cancer genetic counseling scheduling number at [phone].)  The results will be shared with Cori Razor, Finis Bud, MD and [name], MD     Davida is encouraged  to reach out the Hered[facility] Cancer Clinic at the [facility] with any questions or concerns following today's visit at [phone].     Time: Direct patient care: 25 minutes.  Indirect patient care: 45 minutes. Obtained and reviewed medical, surgical, and family history.  Discussed hereditary cancer syndromes, autosomal dominant inheritance / other inheritance as relevant, pre-symptomatic surveillance, management, and treatment. Possible testing outcomes, including possibility of uncertain variants, strategies for testing other family members if a gene alteration is found. Insurance information, current protections under federal legislation, and out-of-pocket price discussed. Completed paperwork including verbal consent for genetic testing.    Lamona Curl, MS, [facility] (she/her)  Certified Genetic Counselor  [place] [facility]  [address] [place], [place]  [address]    Email: Tawny Asal  Telephone: [phone]  Fax: [phone]  Appointments: [phone]

## 2023-01-05 ENCOUNTER — Encounter: Admit: 2023-01-05 | Discharge: 2023-01-05 | Payer: MEDICARE

## 2023-01-05 DIAGNOSIS — Z720 Tobacco use: Secondary | ICD-10-CM

## 2023-01-05 DIAGNOSIS — E278 Other specified disorders of adrenal gland: Secondary | ICD-10-CM

## 2023-01-05 DIAGNOSIS — E213 Hyperparathyroidism, unspecified: Secondary | ICD-10-CM

## 2023-01-05 DIAGNOSIS — I499 Cardiac arrhythmia, unspecified: Secondary | ICD-10-CM

## 2023-01-05 DIAGNOSIS — E7800 Pure hypercholesterolemia, unspecified: Secondary | ICD-10-CM

## 2023-01-05 DIAGNOSIS — Q615 Medullary cystic kidney: Secondary | ICD-10-CM

## 2023-01-05 DIAGNOSIS — Z8659 Personal history of other mental and behavioral disorders: Secondary | ICD-10-CM

## 2023-01-05 DIAGNOSIS — F99 Mental disorder, not otherwise specified: Secondary | ICD-10-CM

## 2023-01-05 DIAGNOSIS — E039 Hypothyroidism, unspecified: Secondary | ICD-10-CM

## 2023-01-05 DIAGNOSIS — G4733 Obstructive sleep apnea (adult) (pediatric): Secondary | ICD-10-CM

## 2023-01-05 DIAGNOSIS — J302 Other seasonal allergic rhinitis: Secondary | ICD-10-CM

## 2023-01-05 DIAGNOSIS — E785 Hyperlipidemia, unspecified: Secondary | ICD-10-CM

## 2023-01-05 DIAGNOSIS — D539 Nutritional anemia, unspecified: Secondary | ICD-10-CM

## 2023-01-05 DIAGNOSIS — R000 Tachycardia, unspecified: Secondary | ICD-10-CM

## 2023-01-05 DIAGNOSIS — Z803 Family history of malignant neoplasm of breast: Secondary | ICD-10-CM

## 2023-01-05 DIAGNOSIS — D352 Benign neoplasm of pituitary gland: Secondary | ICD-10-CM

## 2023-01-05 DIAGNOSIS — Z808 Family history of malignant neoplasm of other organs or systems: Secondary | ICD-10-CM

## 2023-01-05 DIAGNOSIS — K219 Gastro-esophageal reflux disease without esophagitis: Secondary | ICD-10-CM

## 2023-01-05 DIAGNOSIS — G4730 Sleep apnea, unspecified: Secondary | ICD-10-CM

## 2023-01-05 DIAGNOSIS — F32A Depressed: Secondary | ICD-10-CM

## 2023-01-05 DIAGNOSIS — F4310 Post-traumatic stress disorder, unspecified: Secondary | ICD-10-CM

## 2023-01-05 DIAGNOSIS — M1990 Unspecified osteoarthritis, unspecified site: Secondary | ICD-10-CM

## 2023-01-05 DIAGNOSIS — N390 Urinary tract infection, site not specified: Secondary | ICD-10-CM

## 2023-01-05 DIAGNOSIS — D497 Neoplasm of unspecified behavior of endocrine glands and other parts of nervous system: Secondary | ICD-10-CM

## 2023-01-05 DIAGNOSIS — Z8042 Family history of malignant neoplasm of prostate: Secondary | ICD-10-CM

## 2023-01-05 DIAGNOSIS — F1020 Alcohol dependence, uncomplicated: Secondary | ICD-10-CM

## 2023-01-05 DIAGNOSIS — Z801 Family history of malignant neoplasm of trachea, bronchus and lung: Secondary | ICD-10-CM

## 2023-01-06 ENCOUNTER — Encounter: Admit: 2023-01-06 | Discharge: 2023-01-06 | Payer: MEDICARE

## 2023-01-07 ENCOUNTER — Encounter: Admit: 2023-01-07 | Discharge: 2023-01-07 | Payer: MEDICARE

## 2023-01-07 ENCOUNTER — Ambulatory Visit: Admit: 2023-01-07 | Discharge: 2023-01-08 | Payer: MEDICARE

## 2023-01-07 ENCOUNTER — Ambulatory Visit: Admit: 2023-01-07 | Discharge: 2023-01-07 | Payer: MEDICARE

## 2023-01-07 DIAGNOSIS — E7800 Pure hypercholesterolemia, unspecified: Secondary | ICD-10-CM

## 2023-01-07 DIAGNOSIS — D352 Benign neoplasm of pituitary gland: Secondary | ICD-10-CM

## 2023-01-07 DIAGNOSIS — J302 Other seasonal allergic rhinitis: Secondary | ICD-10-CM

## 2023-01-07 DIAGNOSIS — D497 Neoplasm of unspecified behavior of endocrine glands and other parts of nervous system: Secondary | ICD-10-CM

## 2023-01-07 DIAGNOSIS — G4733 Obstructive sleep apnea (adult) (pediatric): Secondary | ICD-10-CM

## 2023-01-07 DIAGNOSIS — F32A Depressed: Secondary | ICD-10-CM

## 2023-01-07 DIAGNOSIS — Z720 Tobacco use: Secondary | ICD-10-CM

## 2023-01-07 DIAGNOSIS — E039 Hypothyroidism, unspecified: Secondary | ICD-10-CM

## 2023-01-07 DIAGNOSIS — E213 Hyperparathyroidism, unspecified: Secondary | ICD-10-CM

## 2023-01-07 DIAGNOSIS — I499 Cardiac arrhythmia, unspecified: Secondary | ICD-10-CM

## 2023-01-07 DIAGNOSIS — E278 Other specified disorders of adrenal gland: Secondary | ICD-10-CM

## 2023-01-07 DIAGNOSIS — R000 Tachycardia, unspecified: Secondary | ICD-10-CM

## 2023-01-07 DIAGNOSIS — K219 Gastro-esophageal reflux disease without esophagitis: Secondary | ICD-10-CM

## 2023-01-07 DIAGNOSIS — G4730 Sleep apnea, unspecified: Secondary | ICD-10-CM

## 2023-01-07 DIAGNOSIS — D539 Nutritional anemia, unspecified: Secondary | ICD-10-CM

## 2023-01-07 DIAGNOSIS — M1990 Unspecified osteoarthritis, unspecified site: Secondary | ICD-10-CM

## 2023-01-07 DIAGNOSIS — Z789 Other specified health status: Secondary | ICD-10-CM

## 2023-01-07 DIAGNOSIS — G4711 Idiopathic hypersomnia with long sleep time: Secondary | ICD-10-CM

## 2023-01-07 DIAGNOSIS — Q615 Medullary cystic kidney: Secondary | ICD-10-CM

## 2023-01-07 DIAGNOSIS — F4310 Post-traumatic stress disorder, unspecified: Secondary | ICD-10-CM

## 2023-01-07 DIAGNOSIS — Z801 Family history of malignant neoplasm of trachea, bronchus and lung: Secondary | ICD-10-CM

## 2023-01-07 DIAGNOSIS — F99 Mental disorder, not otherwise specified: Secondary | ICD-10-CM

## 2023-01-07 DIAGNOSIS — Z8659 Personal history of other mental and behavioral disorders: Secondary | ICD-10-CM

## 2023-01-07 DIAGNOSIS — Z808 Family history of malignant neoplasm of other organs or systems: Secondary | ICD-10-CM

## 2023-01-07 DIAGNOSIS — Z803 Family history of malignant neoplasm of breast: Secondary | ICD-10-CM

## 2023-01-07 DIAGNOSIS — N390 Urinary tract infection, site not specified: Secondary | ICD-10-CM

## 2023-01-07 DIAGNOSIS — E785 Hyperlipidemia, unspecified: Secondary | ICD-10-CM

## 2023-01-07 DIAGNOSIS — Z8042 Family history of malignant neoplasm of prostate: Secondary | ICD-10-CM

## 2023-01-07 DIAGNOSIS — F1020 Alcohol dependence, uncomplicated: Secondary | ICD-10-CM

## 2023-01-07 NOTE — Assessment & Plan Note
Reviewed Inspire remote download. 23/30 nights used, usage 4 hours or greater 57%. Average nightly usage 6 h 13 m with 0.5 pauses per night. Incoming amplitude 1.9.v on default configuration. She is having significant issues with comfort.     We feel she might be overtitrated on settings >2 V. She reports comfort around 1.9 V. Accordingly,    Will proceed with Inspire titration study, will try it on 1.8-2.3 V in sleep lab. If no improvement in AHI, may consider a DISE for titration.

## 2023-01-14 ENCOUNTER — Encounter: Admit: 2023-01-14 | Discharge: 2023-01-14 | Payer: MEDICARE

## 2023-01-18 ENCOUNTER — Encounter: Admit: 2023-01-18 | Discharge: 2023-01-18 | Payer: MEDICARE

## 2023-01-18 ENCOUNTER — Ambulatory Visit: Admit: 2023-01-18 | Discharge: 2023-01-18 | Payer: MEDICARE

## 2023-01-18 DIAGNOSIS — D352 Benign neoplasm of pituitary gland: Secondary | ICD-10-CM

## 2023-01-18 DIAGNOSIS — U071 COVID-19: Secondary | ICD-10-CM

## 2023-01-18 DIAGNOSIS — E210 Primary hyperparathyroidism: Secondary | ICD-10-CM

## 2023-01-18 DIAGNOSIS — N390 Urinary tract infection, site not specified: Secondary | ICD-10-CM

## 2023-01-18 DIAGNOSIS — F4310 Post-traumatic stress disorder, unspecified: Secondary | ICD-10-CM

## 2023-01-18 DIAGNOSIS — E7800 Pure hypercholesterolemia, unspecified: Secondary | ICD-10-CM

## 2023-01-18 DIAGNOSIS — F1020 Alcohol dependence, uncomplicated: Secondary | ICD-10-CM

## 2023-01-18 DIAGNOSIS — E039 Hypothyroidism, unspecified: Secondary | ICD-10-CM

## 2023-01-18 DIAGNOSIS — F32A Depressed: Secondary | ICD-10-CM

## 2023-01-18 DIAGNOSIS — I499 Cardiac arrhythmia, unspecified: Secondary | ICD-10-CM

## 2023-01-18 DIAGNOSIS — E278 Other specified disorders of adrenal gland: Secondary | ICD-10-CM

## 2023-01-18 DIAGNOSIS — Z8659 Personal history of other mental and behavioral disorders: Secondary | ICD-10-CM

## 2023-01-18 DIAGNOSIS — G4730 Sleep apnea, unspecified: Secondary | ICD-10-CM

## 2023-01-18 DIAGNOSIS — R000 Tachycardia, unspecified: Secondary | ICD-10-CM

## 2023-01-18 DIAGNOSIS — G4733 Obstructive sleep apnea (adult) (pediatric): Secondary | ICD-10-CM

## 2023-01-18 DIAGNOSIS — J302 Other seasonal allergic rhinitis: Secondary | ICD-10-CM

## 2023-01-18 DIAGNOSIS — M1990 Unspecified osteoarthritis, unspecified site: Secondary | ICD-10-CM

## 2023-01-18 DIAGNOSIS — Z720 Tobacco use: Secondary | ICD-10-CM

## 2023-01-18 DIAGNOSIS — Q615 Medullary cystic kidney: Secondary | ICD-10-CM

## 2023-01-18 DIAGNOSIS — K219 Gastro-esophageal reflux disease without esophagitis: Secondary | ICD-10-CM

## 2023-01-18 DIAGNOSIS — F99 Mental disorder, not otherwise specified: Secondary | ICD-10-CM

## 2023-01-18 DIAGNOSIS — J069 Acute upper respiratory infection, unspecified: Secondary | ICD-10-CM

## 2023-01-18 DIAGNOSIS — E785 Hyperlipidemia, unspecified: Secondary | ICD-10-CM

## 2023-01-18 DIAGNOSIS — D539 Nutritional anemia, unspecified: Secondary | ICD-10-CM

## 2023-01-18 MED ORDER — FLECAINIDE 100 MG PO TAB
ORAL_TABLET | ORAL | 3 refills | 30.00000 days | Status: AC
Start: 2023-01-18 — End: ?

## 2023-01-18 NOTE — Patient Instructions
For up to date information on the COVID-19 virus, visit the CDC website. https://www.cdc.gov/coronavirus   General supportive care during cold and flu season and infection prevention reminders:    o Wash hands often with soap and water for at least 20 seconds   o Cover your mouth and nose   o Social distancing: try to maintain 6 feet between you and other people   o Stay home if sick and symptoms mild or manageable?  If you must be around people wear a mask     If you are having symptoms of a lower respiratory infection (cough, shortness of breath) and/or fever AND either traveled in last 30 days (internationally or to region of exposure) OR known exposure to patient with COVID19:     o Call your primary care provider for questions or health needs.   Tell your doctor about your recent travel and your symptoms     o In a medical emergency, call 911 or go to the nearest emergency room.

## 2023-01-18 NOTE — Progress Notes
Holly Maldonado is a 62 y.o. female.    Chief Complaint:  Chief Complaint   Patient presents with    Cough     Started last Thursday. Taking tylenol and ibuprofen at home. Fevers in the 100s at home.    Fever    Ear Pain    Sore Throat    Generalized Body Aches       History of Present Illness:  HPI  This is a 62 year old female sore throat, nasal congestion with cough.  Some body aches.  Fevers in the low 100s at home.  Symptoms started on Thursday.  No severe difficulty breathing.  Would like to know if she has COVID.    Review of Systems:  Review of Systems   Constitutional:  Negative for chills and fever.   HENT:  Positive for rhinorrhea and sore throat. Negative for ear pain and sinus pain.    Respiratory:  Positive for cough. Negative for shortness of breath.    Skin:  Negative for rash.       Allergies:  Allergies   Allergen Reactions    Buspirone ANXIETY    Fetzima [Levomilnacipran] SEE COMMENTS     Per pt very depressed, couldn't stop crying.    Ipratropium SEE COMMENTS     Blurry vision with ipratropium nasal spray    Lurasidone SEE COMMENTS    Trazodone HEADACHE       Past Medical History:  Past Medical History:   Diagnosis Date    Adrenal nodule (HCC)     Left    Alcoholism (HCC) 1979    Haven?t drank since 1980    Arrhythmia     multiple PVCs from EKG 08/14/12    Arthritis 2019    Knee    depression     Dyslipidemia     Endometriosis     Esophageal reflux     H/O eating disorder     Hematuria     microscopic    High cholesterol     Hypothyroid     Medullary sponge kidney     Mental disorder     Nephrocalcinosis     Obstructive sleep apnea 2003    Pituitary adenoma (HCC)     2014    PTSD (post-traumatic stress disorder)     Recurrent UTI     Seasonal allergic reaction 2021    Sleep apnea     Tachycardia     Tobacco abuse 1975    Unspecified deficiency anemia     When pregnant       Past Surgical History:  Surgical History:   Procedure Laterality Date    HX TUBAL LIGATION  2010    PR NEURECTOMY HAMSTRING MUSCLE Left 2018    COLONOSCOPY DIAGNOSTIC WITH SPECIMEN COLLECTION BY BRUSHING/ WASHING - FLEXIBLE N/A 12/15/2018    Performed by Veneta Penton, MD at [facility] OR    ESOPHAGOGASTRODUODENOSCOPY WITH BIOPSY - FLEXIBLE N/A 05/06/2021    Performed by Buckles, Vinnie Level, MD at [facility] OR    DRUG-INDUCED SLEEP ENDOSCOPY WITH DYNAMIC EVALUATION OF VELUM/ PHARYNX/ TONGUE BASE/ AND LARYNX FOR EVALUATION OF SLEEP-DISORDERED BREATHING - FLEXIBLE -DIAGNOSTIC N/A 06/18/2021    Performed by Lurline Idol, MD at [facility] OR    ROBOT ASSISTED LAPAROSCOPIC ADRENALECTOMY Left 07/31/2021    Performed by Willette Cluster, MD at [facility] OR    OPEN IMPLANTATION HYPOGLOSSAL NERVE NEUROSTIMULATOR ARRAY/ PULSE GENERATOR/ DISTAL RESPIRATORY SENSOR ELECTRODE/ ELECTRODE ARRAY Right 10/13/2021  Performed by Lurline Idol, MD at [facility] OR    MANUAL/ MECHANICAL EXTRACAPSULAR CATARACT REMOVAL WITH INSERTION INTRAOCULAR LENS PROSTHESIS - 1 STAGE Right 09/14/2022    Performed by Gilmore Laroche, MD at [facility] OR    ELECTROCARDIOGRAM      HX BLADDER SUSPENSION      HX CESAREAN SECTION      HX HYSTERECTOMY      HX SINUS SURGERY      HX TONSILLECTOMY      LAPAROSCOPY         Pertinent medical/surgical history reviewed    History obtained from patient.    Social History:  Social History     Tobacco Use    Smoking status: Former     Current packs/day: 0.00     Average packs/day: 1 pack/day for 7.0 years (7.0 ttl pk-yrs)     Types: Cigarettes     Start date: 08/22/1985     Quit date: 08/22/1992     Years since quitting: 30.4    Smokeless tobacco: Never   Vaping Use    Vaping status: Never Used   Substance Use Topics    Alcohol use: No    Drug use: No     Social History     Substance and Sexual Activity   Drug Use No             Family History:  Family History   Problem Relation Name Age of Onset    Diabetes Type II Mother Chales Abrahams     Heart Attack Mother Chales Abrahams         Died at 47 from heart attack    Allergy-severe Mother Chales Abrahams     Anesthetic Complication Mother Chales Abrahams     Cancer-Hematologic Mother Chales Abrahams 51        CLL (chronic lymphocytic leukemia)    Coronary Artery Disease Father Louanne Skye     None Reported Brother Thurmond Butts     None Reported Daughter Erie Noe     None Reported Daughter [place]         precancerous moles removed    None Reported Daughter Allysan         precancerous moles removed    None Reported Son John     None Reported Son Maurice March     Coronary Artery Disease Maternal Grandmother Barbara     Circulatory problem Maternal Grandmother Britta Mccreedy     Coronary Artery Disease Maternal Grandfather Ray     Heart Attack Maternal Grandfather Ray     Heart Surgery Maternal Grandfather Ray     Coronary Artery Disease Paternal Grandmother Bertilla     Cancer-Prostate Paternal Lisa Roca 70        mets to bones    Coronary Artery Disease Paternal Grandfather Bernard     Cancer-Breast Maternal Aunt  59    Cancer-Lung Other Half aunt viaMGM, Kendal Hymen 30        SCCL mets to brain    Cancer-Lung Other Half uncle via MGM(2) 60        mets to brain    Cancer-Skin Other Half uncle via MGF         mets    Glaucoma Neg Hx      Macular Degen Neg Hx      Strabismus Neg Hx      Retinal Detachment Neg Hx      Blindness Neg Hx      Cataract Neg Hx  Amblyopia Neg Hx            aspirin EC (ASPIR-LOW) 81 mg tablet Take one tablet by mouth daily.    bempedoic acid-ezetimibe (NEXLIZET) 180-10 mg tablet Take one tablet by mouth daily.    docosahexaenoic acid/epa (FISH OIL PO) Take 3 capsules by mouth daily.    famotidine (PEPCID) 40 mg tablet Take one tablet by mouth twice daily.    flecainide (TAMBOCOR) 100 mg tablet TAKE 1/2 TABLET TWICE A DAY BY MOUTH    FLUoxetine (PROZAC) 20 mg capsule Take one capsule by mouth daily. Take one capsule (20mg ) by mouth daily with additional two 40mg  capsules (80mg ) for total daily dose of 100mg .    FLUoxetine (PROZAC) 40 mg capsule Take two capsules by mouth daily. Take two capsules (80mg ) by mouth daily with one 20mg  capsule for total daily dose of 100mg .    lamoTRIgine (LAMICTAL) 100 mg tablet Take two tablets by mouth daily.    levothyroxine (SYNTHROID) 88 mcg tablet Take one tablet by mouth daily 30 minutes before breakfast.    magnesium oxide (MAG-OX) 400 mg (241.3 mg magnesium) tablet Take three tablets by mouth at bedtime daily. 1,200 mg = 3 tablets    other medication CITRUS BERGAMOT supplement. 1 tablet in the morning  Indications: for cholesterol    potassium citrate (UROCIT-K 10) 10 mEq (1,080 mg) tablet TAKE TWO TABLETS BY MOUTH TWICE DAILY. TAKE WITH FOOD.    prednisolone acetate (PRED FORTE) 1 % ophthalmic suspension Apply one drop to right eye as directed four times daily. Wait to start until after eye surgery    rosuvastatin (CRESTOR) 10 mg tablet Take one tablet by mouth daily.       Vitals:  Vitals:    01/18/23 1143   BP: 103/69   BP Source: Arm, Right Upper   Pulse: 78   Temp: 36.7 ?C (98.1 ?F)   Resp: 18   SpO2: 97%   TempSrc: Oral   Weight: 82.6 kg (182 lb)   Height: 162.6 cm (5' 4)        Physical Exam:  Physical Exam  Constitutional:       Appearance: Normal appearance.   HENT:      Head: Normocephalic and atraumatic.      Mouth/Throat:      Mouth: Mucous membranes are moist.      Pharynx: Posterior oropharyngeal erythema present. No oropharyngeal exudate.   Eyes:      Conjunctiva/sclera: Conjunctivae normal.   Cardiovascular:      Rate and Rhythm: Normal rate and regular rhythm.      Heart sounds: Normal heart sounds.   Pulmonary:      Effort: Pulmonary effort is normal.      Breath sounds: Normal breath sounds.   Musculoskeletal:      Cervical back: Normal range of motion and neck supple.   Skin:     General: Skin is warm and dry.      Findings: No rash.   Neurological:      Mental Status: She is alert.         Procedures    Laboratory Results:  Results for orders placed or performed in visit on 01/18/23 (from the past 336 hour(s))   POC COV2   Result Value Ref Range    COVID-19, PCR POC Positive (A) Negative COVID-19, PCR QC  Pass      *Note: Due to a large number of results and/or encounters for the requested time period,  some results have not been displayed. A complete set of results can be found in Results Review.         Radiology Interpretation:      EKG:    Facility Administered Meds:               Clinical Impression:  Holly Maldonado is a 62 y.o. female with URI symptoms for the past 5 days.  She is concerned about potentially having COVID.  No clear evidence of pneumonia or sinus infection.  Her COVID test was positive.  Given her duration of symptoms, did not feel like she be a good candidate for Paxlovid.  She has no respiratory distress or hypoxia.      Disposition/Follow up       1. COVID-19        2. URI, acute  POC COV2      Supportive care  CDC quarantine guidelines  Reevaluation for worsening symptoms  Contact clinic for any additional concerns    Medications:  No orders of the defined types were placed in this encounter.        Procedure Notes:      Patient Instructions:    Patient Instructions   For up to date information on the COVID-19 virus, visit the [facility] website. [email or web address]   General supportive care during cold and flu season and infection prevention reminders:    o Wash hands often with soap and water for at least 20 seconds   o Cover your mouth and nose   o Social distancing: try to maintain 6 feet between you and other people   o Stay home if sick and symptoms mild or manageable?  If you must be around people wear a mask     If you are having symptoms of a lower respiratory infection (cough, shortness of breath) and/or fever AND either traveled in last 30 days (internationally or to region of exposure) OR known exposure to patient with COVID19:     o Call your primary care provider for questions or health needs.   Tell your doctor about your recent travel and your symptoms     o In a medical emergency, call 911 or go to the nearest emergency room.          Jory Sims, MD

## 2023-01-19 ENCOUNTER — Encounter: Admit: 2023-01-19 | Discharge: 2023-01-19 | Payer: MEDICARE

## 2023-01-24 ENCOUNTER — Encounter: Admit: 2023-01-24 | Discharge: 2023-01-24 | Payer: MEDICARE

## 2023-01-27 ENCOUNTER — Encounter: Admit: 2023-01-27 | Discharge: 2023-01-27 | Payer: MEDICARE

## 2023-01-27 MED ORDER — LAMOTRIGINE 100 MG PO TAB
200 mg | ORAL_TABLET | Freq: Every day | ORAL | 2 refills | Status: AC
Start: 2023-01-27 — End: ?

## 2023-01-28 ENCOUNTER — Ambulatory Visit: Admit: 2023-01-28 | Discharge: 2023-01-29 | Payer: MEDICARE

## 2023-01-28 ENCOUNTER — Encounter: Admit: 2023-01-28 | Discharge: 2023-01-28 | Payer: MEDICARE

## 2023-01-28 DIAGNOSIS — G4730 Sleep apnea, unspecified: Secondary | ICD-10-CM

## 2023-01-28 DIAGNOSIS — F1020 Alcohol dependence, uncomplicated: Secondary | ICD-10-CM

## 2023-01-28 DIAGNOSIS — F4310 Post-traumatic stress disorder, unspecified: Secondary | ICD-10-CM

## 2023-01-28 DIAGNOSIS — Z780 Asymptomatic menopausal state: Secondary | ICD-10-CM

## 2023-01-28 DIAGNOSIS — N390 Urinary tract infection, site not specified: Secondary | ICD-10-CM

## 2023-01-28 DIAGNOSIS — K219 Gastro-esophageal reflux disease without esophagitis: Secondary | ICD-10-CM

## 2023-01-28 DIAGNOSIS — F32A Depressed: Secondary | ICD-10-CM

## 2023-01-28 DIAGNOSIS — Z720 Tobacco use: Secondary | ICD-10-CM

## 2023-01-28 DIAGNOSIS — R000 Tachycardia, unspecified: Secondary | ICD-10-CM

## 2023-01-28 DIAGNOSIS — I499 Cardiac arrhythmia, unspecified: Secondary | ICD-10-CM

## 2023-01-28 DIAGNOSIS — E210 Primary hyperparathyroidism: Secondary | ICD-10-CM

## 2023-01-28 DIAGNOSIS — E785 Hyperlipidemia, unspecified: Secondary | ICD-10-CM

## 2023-01-28 DIAGNOSIS — F99 Mental disorder, not otherwise specified: Secondary | ICD-10-CM

## 2023-01-28 DIAGNOSIS — Q615 Medullary cystic kidney: Secondary | ICD-10-CM

## 2023-01-28 DIAGNOSIS — G4733 Obstructive sleep apnea (adult) (pediatric): Secondary | ICD-10-CM

## 2023-01-28 DIAGNOSIS — E7800 Pure hypercholesterolemia, unspecified: Secondary | ICD-10-CM

## 2023-01-28 DIAGNOSIS — Z8659 Personal history of other mental and behavioral disorders: Secondary | ICD-10-CM

## 2023-01-28 DIAGNOSIS — M1990 Unspecified osteoarthritis, unspecified site: Secondary | ICD-10-CM

## 2023-01-28 DIAGNOSIS — D352 Benign neoplasm of pituitary gland: Secondary | ICD-10-CM

## 2023-01-28 DIAGNOSIS — E278 Other specified disorders of adrenal gland: Secondary | ICD-10-CM

## 2023-01-28 DIAGNOSIS — E039 Hypothyroidism, unspecified: Secondary | ICD-10-CM

## 2023-01-28 DIAGNOSIS — J302 Other seasonal allergic rhinitis: Secondary | ICD-10-CM

## 2023-01-28 DIAGNOSIS — D539 Nutritional anemia, unspecified: Secondary | ICD-10-CM

## 2023-01-28 MED ORDER — FEZOLINETANT 45 MG PO TAB
45 mg | ORAL_TABLET | Freq: Every day | ORAL | 1 refills | Status: AC
Start: 2023-01-28 — End: ?

## 2023-01-28 MED ORDER — VEOZAH 45 MG PO TAB
45 mg | ORAL_TABLET | Freq: Every day | ORAL | 1 refills
Start: 2023-01-28 — End: ?

## 2023-01-28 NOTE — Progress Notes
Chief Complaint   Patient presents with    Follow Up   Pituitary microadenoma    Date of Service: 01/28/2023    Holly Maldonado is a 62 y.o. female. DOB: 1960/11/14   MRN#: [id number]    HPI:  Hypothyroidism: She is on levothyroxine 88 mcg daily.  Dose was reduced earlier 2022.  She is compliant with medicine and takes it on an empty stomach in morning.     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 He has been followed for the prolactin. She appears to have had regression of pituitary adenoma in imagine 11/2017.  No adenoma was seen in 2020 on MRI.  Last prolactin level was normal.  She denies breast complaints, discharge.    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    Patient has a history of depression and has been  Lithium, Prozac, Lamictal, Cymbalta in the past.    High PTH: She has past history of nephrolithiasis and she has medullary sponge kidney disease.  Her first kidney stone was 22 years back.  She had ultrasound in 2022 that showed kidney stones.  She was started on chlorthalidone by nephrologist in 2022 as her 24-hour urine calcium was high.  PTH was checked 2023 and it was found to be elevated.  We stopped chlorthalidone 2023 fall.    Patient was discussed at parathyroid tumor board and it was felt that hypercalciuria and kidney stones is because of other abnormalities.  Parathyroid surgery may not necessarily correct those abnormalities and prevent kidney stones.  Neck ultrasound showed possible parathyroid enlargement, no obvious adenoma.    She has maybe 1 serving of calcium rich food on a daily basis.  Otherwise diet is low in calcium.  She takes a MV.    Hypothyroidism: On levothyroxine 88 mcg daily.    She has salt craving and excess sweating at night.    Review of Systems   Constitutional:  Positive for malaise/fatigue. Negative for fever.   Cardiovascular:  Negative for chest pain.   Psychiatric/Behavioral:  Positive for depression.          Objective:      aspirin EC (ASPIR-LOW) 81 mg tablet Take one tablet by mouth daily.    bempedoic acid-ezetimibe (NEXLIZET) 180-10 mg tablet Take one tablet by mouth daily.    docosahexaenoic acid/epa (FISH OIL PO) Take 3 capsules by mouth daily.    famotidine (PEPCID) 40 mg tablet Take one tablet by mouth twice daily.    fezolinetant (VEOZAH) 45 mg tablet Take one tablet by mouth daily.    flecainide (TAMBOCOR) 100 mg tablet TAKE 1/2 TABLET TWICE A DAY BY MOUTH    FLUoxetine (PROZAC) 20 mg capsule Take one capsule by mouth daily. Take one capsule (20mg ) by mouth daily with additional two 40mg  capsules (80mg ) for total daily dose of 100mg .    FLUoxetine (PROZAC) 40 mg capsule Take two capsules by mouth daily. Take two capsules (80mg ) by mouth daily with one 20mg  capsule for total daily dose of 100mg .    lamoTRIgine (LAMICTAL) 100 mg tablet Take two tablets by mouth daily.    levothyroxine (SYNTHROID) 88 mcg tablet Take one tablet by mouth daily 30 minutes before breakfast.    magnesium oxide (MAG-OX) 400 mg (241.3 mg magnesium) tablet Take  three tablets by mouth at bedtime daily. 1,200 mg = 3 tablets    other medication CITRUS BERGAMOT supplement. 1 tablet in the morning  Indications: for cholesterol    potassium citrate (UROCIT-K 10) 10 mEq (1,080 mg) tablet TAKE TWO TABLETS BY MOUTH TWICE DAILY. TAKE WITH FOOD.    rosuvastatin (CRESTOR) 10 mg tablet Take one tablet by mouth daily.     There were no vitals filed for this visit.    There is no height or weight on file to calculate BMI.     Physical Exam  Vitals and nursing note reviewed.   Constitutional:       General: She is not in acute distress.     Appearance: Normal appearance. She is not ill-appearing, toxic-appearing or diaphoretic.   HENT:      Head: Normocephalic and atraumatic. Nose: Nose normal.   Eyes:      Conjunctiva/sclera: Conjunctivae normal.   Neurological:      Mental Status: She is alert and oriented to person, place, and time.   Psychiatric:         Mood and Affect: Mood normal.               Lab   Thyroid Studies    Lab Results   Component Value Date/Time    TSH 1.72 03/17/2022 09:19 AM    No results found for: Edsel Petrin, [facility]     11/2018 MRI  There is homogeneous enhancement of the pituitary gland, which   demonstrates a somewhat flattened configuration along the sellar floor   without sellar expansion. The posterior pituitary bright spot is normal in   appearance and location. No discrete sellar or suprasellar mass is   identified. The infundibulum,  optic chiasm, and cavernous sinuses   demonstrate normal signal and morphology.     The ventricles and subarachnoid spaces are normal in size and   configuration. There are a few scattered foci of supratentorial white   matter FLAIR hyperintensity, in a bifrontal and subcortical predominant   distribution. There is no midline shift or mass effect. There is no area   of abnormal contrast enhancement. The vascular flow-voids are   unremarkable. Diffusion weighted imaging is not indicative of acute or   recent infarct.       Outside labs:  02/13/2017  TSH 0.37 (reference 0.40-4.5), free T4 1.1 (reference 0.8- 1.6)  Normal CMP, normal CBC  04/20/2018  Cholesterol 276, LDL 195, HDL 79  TSH 12.12       05/23/2015 MRI brain there is relatively unchanged appearance of 6 mm area of non-enhancement noted within posterior aspect of the right lateral aspect of the pituitary gland concerning for underlying pituitary microadenoma  06/22/2017 bone alkaline phosphatase normal  10/10/2017 ACTH at 9:48 AM 16.66 (reference 7.2-63.3)  10/17/2017 prolactin 42.79 (reference 4.79-23.3) normal at 99.17, FSH and LH both normal 72.36 and 31.69 respectively  11/2017 MRI brain 0.5 x 0.4 x 0.7 cm focal area of nodular enhancement along the left temporal convexity extra-axial space 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  Minimal partial empty sella without convincing evidence of pituitary microadenoma    24-hour urine calcium  November 2022: 375  September 2023 219      Assessment and Plan:    Ariyannah A. Annalisia Sehr was seen today for follow up.    Diagnoses and all orders for this visit:    Primary hyperparathyroidism (HCC)  -     COMPREHENSIVE METABOLIC PANEL; Future; Expected date: 01/28/2023  -  25-OH VITAMIN D (D2 + D3); Future; Expected date: 01/28/2023  -     TSH WITH FREE T4 REFLEX; Future; Expected date: 01/28/2023  -     PARATHYROID HORMONE; Future; Expected date: 01/28/2023  -     PHOSPHORUS; Future; Expected date: 01/28/2023  -     IONIZED CALCIUM; Future; Expected date: 01/28/2023    Acquired hypothyroidism  -     TSH WITH FREE T4 REFLEX; Future; Expected date: 01/28/2023    Menopause  -     fezolinetant (VEOZAH) 45 mg tablet; Take one tablet by mouth daily.      Assessment  Hx Pititary microadenoma / Partial Empty Sella  05/23/2015 MRI brain there is relatively unchanged appearance of 6 mm area of non-enhancement noted within posterior aspect of the right lateral aspect of the pituitary gland concerning for underlying pituitary microadenoma  10/10/2017 ACTH at 9:48 AM 16.66 (reference 7.2-63.3)  10/17/2017 prolactin 42.79 (reference 4.79-23.3) normal at 99.17, FSH and LH both normal 72.36 and 31.69 respectively  11/2017 MRI brain 0.5 x 0.4 x 0.7 cm focal area of nodular enhancement along the left temporal convexity extra-axial space 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  Minimal partial empty sella without convincing evidence of pituitary microadenoma  2020: No microadenoma was seen.    Prolactinoma  Previous imaging demonstrates microadenoma  10/17/2017 prolactin 42.79 (reference 4.79-23.3)  Repeat prolactin level was normal.    Uncontrolled hypothyroidism  Dx: 1997  On replacement.  TSH is at goal.  Continue levothyroxine 88 mcg daily.    High PTH: She has past history of nephrolithiasis and she has medullary sponge kidney disease.  Her first kidney stone was 22 years back.  She had ultrasound in 2022 that showed kidney stones.    Patient was discussed at parathyroid tumor board and it was felt that hypercalciuria and kidney stones is because of other abnormalities.  Parathyroid surgery may not necessarily correct those abnormalities and prevent kidney stones.  Neck ultrasound showed possible parathyroid enlargement, no obvious adenoma.    Recommended her to repeat labs and 24-hour urine test.    Hypothyroidism: Check TSH.  Continue levothyroxine current dose.    Excess sweating: We can try for [facility] and see if it helps with the symptoms.    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           30 minutes spent reviewing nephrology records, discussing treatment plan with the patient, documentation.  Electronically signed by Lucrezia Starch, MD 01/28/2023

## 2023-01-31 ENCOUNTER — Encounter: Admit: 2023-01-31 | Discharge: 2023-01-31 | Payer: MEDICARE

## 2023-01-31 MED ORDER — FLUOXETINE 40 MG PO CAP
ORAL_CAPSULE | 2 refills | Status: AC
Start: 2023-01-31 — End: ?

## 2023-02-03 ENCOUNTER — Encounter: Admit: 2023-02-03 | Discharge: 2023-02-03 | Payer: MEDICARE

## 2023-02-03 NOTE — Telephone Encounter
-----   Message from Sanjuana Kava, MD sent at 02/03/2023 11:38 AM CDT -----  Crist Infante,  Kindly order [employer] to be complete in November. I will see her on April 27, 2023.    Thank you.     Doran Durand, MD

## 2023-02-03 NOTE — Telephone Encounter
[employer] faxed and received notice of successful transmission.  Will contact patient to complete in November.

## 2023-02-03 NOTE — Telephone Encounter
I spoke to the patient over the phone to discontinue potassium citrate due to her urine pH. We will follow up 24 hour urine collection in December,2024. Doran Durand, MD

## 2023-02-06 ENCOUNTER — Encounter: Admit: 2023-02-06 | Discharge: 2023-02-06 | Payer: MEDICARE

## 2023-02-08 ENCOUNTER — Ambulatory Visit: Admit: 2023-02-08 | Discharge: 2023-02-08 | Payer: MEDICARE

## 2023-02-08 ENCOUNTER — Encounter: Admit: 2023-02-08 | Discharge: 2023-02-08 | Payer: MEDICARE

## 2023-02-08 DIAGNOSIS — E210 Primary hyperparathyroidism: Secondary | ICD-10-CM

## 2023-02-08 DIAGNOSIS — E039 Hypothyroidism, unspecified: Secondary | ICD-10-CM

## 2023-02-08 LAB — IONIZED CALCIUM: IONIZED CALCIUM: 1.2 MMOL/L (ref 1.0–1.3)

## 2023-02-08 LAB — COMPREHENSIVE METABOLIC PANEL
ALBUMIN: 3.9 g/dL (ref 3.5–5.0)
ALK PHOSPHATASE: 108 U/L (ref 25–110)
ALT: 40 U/L (ref 7–56)
ANION GAP: 8 (ref 3–12)
AST: 45 U/L — ABNORMAL HIGH (ref 7–40)
BLD UREA NITROGEN: 20 mg/dL (ref 7–25)
CALCIUM: 9.6 mg/dL (ref 8.5–10.6)
CHLORIDE: 105 MMOL/L (ref 98–110)
CO2: 25 MMOL/L (ref 21–30)
CREATININE: 0.8 mg/dL (ref 0.4–1.00)
EGFR: >60 mL/min (ref >60)
GLUCOSE,PANEL: 84 mg/dL (ref 70–100)
POTASSIUM: 4.6 MMOL/L (ref 3.5–5.1)
SODIUM: 138 MMOL/L (ref 137–147)
TOTAL BILIRUBIN: 0.3 mg/dL (ref 0.2–1.3)
TOTAL PROTEIN: 7.3 g/dL (ref 6.0–8.0)

## 2023-02-08 LAB — TSH WITH FREE T4 REFLEX: TSH: 2.5 uU/mL (ref 0.35–5.00)

## 2023-02-08 LAB — PARATHYROID HORMONE: PTH HORMONE: 51 pg/mL (ref 10–65)

## 2023-02-08 LAB — PHOSPHORUS: PHOSPHORUS: 3.8 mg/dL (ref 2.0–4.5)

## 2023-02-09 ENCOUNTER — Encounter: Admit: 2023-02-09 | Discharge: 2023-02-09 | Payer: MEDICARE

## 2023-02-09 DIAGNOSIS — G4733 Obstructive sleep apnea (adult) (pediatric): Secondary | ICD-10-CM

## 2023-02-09 DIAGNOSIS — Z789 Other specified health status: Secondary | ICD-10-CM

## 2023-02-10 ENCOUNTER — Encounter: Admit: 2023-02-10 | Discharge: 2023-02-10 | Payer: MEDICARE

## 2023-02-10 ENCOUNTER — Ambulatory Visit: Admit: 2023-02-10 | Discharge: 2023-02-09 | Payer: MEDICARE

## 2023-02-10 DIAGNOSIS — G4733 Obstructive sleep apnea (adult) (pediatric): Secondary | ICD-10-CM

## 2023-02-13 ENCOUNTER — Encounter: Admit: 2023-02-13 | Discharge: 2023-02-13 | Payer: MEDICARE

## 2023-02-13 ENCOUNTER — Ambulatory Visit: Admit: 2023-02-13 | Discharge: 2023-02-13 | Payer: MEDICARE

## 2023-02-13 DIAGNOSIS — G4730 Sleep apnea, unspecified: Secondary | ICD-10-CM

## 2023-02-13 DIAGNOSIS — E039 Hypothyroidism, unspecified: Secondary | ICD-10-CM

## 2023-02-13 DIAGNOSIS — G4733 Obstructive sleep apnea (adult) (pediatric): Secondary | ICD-10-CM

## 2023-02-13 DIAGNOSIS — I499 Cardiac arrhythmia, unspecified: Secondary | ICD-10-CM

## 2023-02-13 DIAGNOSIS — M1990 Unspecified osteoarthritis, unspecified site: Secondary | ICD-10-CM

## 2023-02-13 DIAGNOSIS — F4310 Post-traumatic stress disorder, unspecified: Secondary | ICD-10-CM

## 2023-02-13 DIAGNOSIS — J302 Other seasonal allergic rhinitis: Secondary | ICD-10-CM

## 2023-02-13 DIAGNOSIS — F1020 Alcohol dependence, uncomplicated: Secondary | ICD-10-CM

## 2023-02-13 DIAGNOSIS — Z8659 Personal history of other mental and behavioral disorders: Secondary | ICD-10-CM

## 2023-02-13 DIAGNOSIS — F32A Depressed: Secondary | ICD-10-CM

## 2023-02-13 DIAGNOSIS — R000 Tachycardia, unspecified: Secondary | ICD-10-CM

## 2023-02-13 DIAGNOSIS — N390 Urinary tract infection, site not specified: Secondary | ICD-10-CM

## 2023-02-13 DIAGNOSIS — Q615 Medullary cystic kidney: Secondary | ICD-10-CM

## 2023-02-13 DIAGNOSIS — D539 Nutritional anemia, unspecified: Secondary | ICD-10-CM

## 2023-02-13 DIAGNOSIS — J0100 Acute maxillary sinusitis, unspecified: Secondary | ICD-10-CM

## 2023-02-13 DIAGNOSIS — Z720 Tobacco use: Secondary | ICD-10-CM

## 2023-02-13 DIAGNOSIS — E279 Disorder of adrenal gland, unspecified: Secondary | ICD-10-CM

## 2023-02-13 DIAGNOSIS — E785 Hyperlipidemia, unspecified: Secondary | ICD-10-CM

## 2023-02-13 DIAGNOSIS — D352 Benign neoplasm of pituitary gland: Secondary | ICD-10-CM

## 2023-02-13 DIAGNOSIS — E7800 Pure hypercholesterolemia, unspecified: Secondary | ICD-10-CM

## 2023-02-13 DIAGNOSIS — F99 Mental disorder, not otherwise specified: Secondary | ICD-10-CM

## 2023-02-13 DIAGNOSIS — K219 Gastro-esophageal reflux disease without esophagitis: Secondary | ICD-10-CM

## 2023-02-13 MED ORDER — AMOXICILLIN-POT CLAVULANATE 875-125 MG PO TAB
1 | ORAL_TABLET | Freq: Two times a day (BID) | ORAL | 0 refills | 7.00000 days | Status: AC
Start: 2023-02-13 — End: ?

## 2023-02-13 MED ORDER — PREDNISONE 20 MG PO TAB
40 mg | ORAL_TABLET | Freq: Every day | ORAL | 0 refills | Status: AC
Start: 2023-02-13 — End: ?

## 2023-02-13 NOTE — Progress Notes
Holly Maldonado is a 62 y.o. female.    Chief Complaint:  Chief Complaint   Patient presents with    Congestion     Covid 1 month ago. Has not felt it ever resolved. Has  head congestion, weakness and exhaustion currently.        History of Present Illness:  Patient presents to clinic with continued URI symptoms.  Patient reports she had COVID about a month ago.  She treated with over-the-counter medications and was improving.  However she has continued to have ear fullness, sinus pressure, is feeling very rundown with muscle aches and fatigue. She reports cough has improved and she is not having any shortness of breath or difficulty breathing.    Sinus Infection  This is a new problem. The current episode started 1 to 4 weeks ago. The problem has been waxing and waning since onset. There has been no fever. Her pain is at a severity of 8/10. Associated symptoms include chills, congestion, ear pain, headaches, a hoarse voice, sinus pressure and a sore throat. Pertinent negatives include no coughing, shortness of breath, sneezing or swollen glands. Treatments tried: dayquil, nyquil, allegra.         Review of Systems:  Review of Systems   Constitutional:  Positive for chills.   HENT:  Positive for congestion, ear pain, hoarse voice, sinus pressure and sore throat. Negative for sneezing.    Respiratory:  Negative for cough and shortness of breath.    Neurological:  Positive for headaches.       Allergies:  Allergies   Allergen Reactions    Buspirone ANXIETY    Fetzima [Levomilnacipran] SEE COMMENTS     Per pt very depressed, couldn't stop crying.    Ipratropium SEE COMMENTS     Blurry vision with ipratropium nasal spray    Lurasidone SEE COMMENTS    Trazodone HEADACHE       Past Medical History:  Past Medical History:   Diagnosis Date    Adrenal nodule (HCC)     Left    Alcoholism (HCC) 1979    Haven?t drank since 1980    Arrhythmia     multiple PVCs from EKG 08/14/12    Arthritis 2019    Knee    depression Dyslipidemia     Endometriosis     Esophageal reflux     H/O eating disorder     Hematuria     microscopic    High cholesterol     Hypothyroid     Medullary sponge kidney     Mental disorder     Nephrocalcinosis     Obstructive sleep apnea 2003    Pituitary adenoma (HCC)     2014    PTSD (post-traumatic stress disorder)     Recurrent UTI     Seasonal allergic reaction 2021    Sleep apnea     Tachycardia     Tobacco abuse 1975    Unspecified deficiency anemia     When pregnant       Past Surgical History:  Surgical History:   Procedure Laterality Date    HX TUBAL LIGATION  2010    PR NEURECTOMY HAMSTRING MUSCLE Left 2018    COLONOSCOPY DIAGNOSTIC WITH SPECIMEN COLLECTION BY BRUSHING/ WASHING - FLEXIBLE N/A 12/15/2018    Performed by Veneta Penton, MD at [facility] OR    ESOPHAGOGASTRODUODENOSCOPY WITH BIOPSY - FLEXIBLE N/A 05/06/2021    Performed by Buckles, Vinnie Level, MD at [facility]  OR    DRUG-INDUCED SLEEP ENDOSCOPY WITH DYNAMIC EVALUATION OF VELUM/ PHARYNX/ TONGUE BASE/ AND LARYNX FOR EVALUATION OF SLEEP-DISORDERED BREATHING - FLEXIBLE -DIAGNOSTIC N/A 06/18/2021    Performed by Lurline Idol, MD at [facility] OR    ROBOT ASSISTED LAPAROSCOPIC ADRENALECTOMY Left 07/31/2021    Performed by Willette Cluster, MD at [facility] OR    OPEN IMPLANTATION HYPOGLOSSAL NERVE NEUROSTIMULATOR ARRAY/ PULSE GENERATOR/ DISTAL RESPIRATORY SENSOR ELECTRODE/ ELECTRODE ARRAY Right 10/13/2021    Performed by Lurline Idol, MD at [facility] OR    MANUAL/ MECHANICAL EXTRACAPSULAR CATARACT REMOVAL WITH INSERTION INTRAOCULAR LENS PROSTHESIS - 1 STAGE Right 09/14/2022    Performed by Gilmore Laroche, MD at [facility] OR    ELECTROCARDIOGRAM      HX BLADDER SUSPENSION      HX CESAREAN SECTION      HX HYSTERECTOMY      HX SINUS SURGERY      HX TONSILLECTOMY      LAPAROSCOPY             Social History:  Social History     Tobacco Use    Smoking status: Former     Current packs/day: 0.00     Average packs/day: 1 pack/day for 7.0 years (7.0 ttl pk-yrs) Types: Cigarettes     Start date: 08/22/1985     Quit date: 08/22/1992     Years since quitting: 30.4    Smokeless tobacco: Never   Vaping Use    Vaping status: Never Used   Substance Use Topics    Alcohol use: No    Drug use: No     Social History     Substance and Sexual Activity   Drug Use No             Family History:  Family History   Problem Relation Name Age of Onset    Diabetes Type II Mother Chales Abrahams     Heart Attack Mother Chales Abrahams         Died at 70 from heart attack    Allergy-severe Mother Chales Abrahams     Anesthetic Complication Mother Chales Abrahams     Cancer-Hematologic Mother Chales Abrahams 51        CLL (chronic lymphocytic leukemia)    Coronary Artery Disease Father Louanne Skye     None Reported Brother Thurmond Butts     None Reported Daughter Erie Noe     None Reported Daughter [place]         precancerous moles removed    None Reported Daughter Allysan         precancerous moles removed    None Reported Son John     None Reported Son Maurice March     Coronary Artery Disease Maternal Grandmother Britta Mccreedy     Circulatory problem Maternal Grandmother Britta Mccreedy     Coronary Artery Disease Maternal Grandfather Ray     Heart Attack Maternal Grandfather Ray     Heart Surgery Maternal Grandfather Ray     Coronary Artery Disease Paternal Grandmother Bertilla     Cancer-Prostate Paternal Lisa Roca 70        mets to bones    Coronary Artery Disease Paternal Grandfather Adela Glimpse     Cancer-Breast Maternal Aunt  38    Cancer-Lung Other Half aunt viaMGM, Kendal Hymen 74        SCCL mets to brain    Cancer-Lung Other Half uncle via MGM(2) 60        mets to  brain    Cancer-Skin Other Half uncle via MGF         mets    Glaucoma Neg Hx      Macular Degen Neg Hx      Strabismus Neg Hx      Retinal Detachment Neg Hx      Blindness Neg Hx      Cataract Neg Hx      Amblyopia Neg Hx            amoxicillin-potassium clavulanate (AUGMENTIN) 875/125 mg tablet Take one tablet by mouth twice daily with meals for 10 days.    aspirin EC (ASPIR-LOW) 81 mg tablet Take one tablet by mouth daily.    bempedoic acid-ezetimibe (NEXLIZET) 180-10 mg tablet Take one tablet by mouth daily.    docosahexaenoic acid/epa (FISH OIL PO) Take 3 capsules by mouth daily.    famotidine (PEPCID) 40 mg tablet Take one tablet by mouth twice daily.    fezolinetant (VEOZAH) 45 mg tablet Take one tablet by mouth daily.    flecainide (TAMBOCOR) 100 mg tablet TAKE 1/2 TABLET TWICE A DAY BY MOUTH    FLUoxetine (PROZAC) 20 mg capsule Take one capsule by mouth daily. Take one capsule (20mg ) by mouth daily with additional two 40mg  capsules (80mg ) for total daily dose of 100mg .    FLUoxetine (PROZAC) 40 mg capsule TAKE TWO CAPSULES (80MG ) BY MOUTH DAILY WITH ONE 20MG  CAPSULE FOR TOTAL DAILY DOSE OF 100MG .    lamoTRIgine (LAMICTAL) 100 mg tablet Take two tablets by mouth daily.    levothyroxine (SYNTHROID) 88 mcg tablet Take one tablet by mouth daily 30 minutes before breakfast.    magnesium oxide (MAG-OX) 400 mg (241.3 mg magnesium) tablet Take three tablets by mouth at bedtime daily. 1,200 mg = 3 tablets    other medication CITRUS BERGAMOT supplement. 1 tablet in the morning  Indications: for cholesterol    potassium citrate (UROCIT-K 10) 10 mEq (1,080 mg) tablet TAKE TWO TABLETS BY MOUTH TWICE DAILY. TAKE WITH FOOD.    predniSONE (DELTASONE) 20 mg tablet Take two tablets by mouth daily with breakfast for 4 days.    rosuvastatin (CRESTOR) 10 mg tablet Take one tablet by mouth daily.       Vitals:  Vitals:    02/13/23 1243   BP: 125/85   BP Source: Arm, Left Upper   Pulse: 69   Temp: 36.1 ?C (97 ?F)   Resp: 16   SpO2: 100%   TempSrc: Temporal   Weight: 83 kg (183 lb)   Height: 162.6 cm (5' 4)        Physical Exam:  Physical Exam  Vitals and nursing note reviewed.   Constitutional:       General: She is not in acute distress.     Appearance: Normal appearance. She is well-developed and normal weight. She is not ill-appearing, toxic-appearing or diaphoretic.   HENT:      Head: Normocephalic and atraumatic. Right Ear: Tympanic membrane and ear canal normal.      Left Ear: Tympanic membrane and ear canal normal.      Nose:      Right Sinus: No maxillary sinus tenderness or frontal sinus tenderness.      Left Sinus: Maxillary sinus tenderness present. No frontal sinus tenderness.      Mouth/Throat:      Pharynx: Uvula midline. Posterior oropharyngeal edema and posterior oropharyngeal erythema present.   Eyes:      Conjunctiva/sclera: Conjunctivae normal.  Pupils: Pupils are equal, round, and reactive to light.   Cardiovascular:      Rate and Rhythm: Normal rate and regular rhythm.      Heart sounds: Normal heart sounds.   Pulmonary:      Effort: Pulmonary effort is normal.      Breath sounds: Normal breath sounds. No wheezing or rhonchi.   Musculoskeletal:      Cervical back: Normal range of motion and neck supple.   Lymphadenopathy:      Cervical: No cervical adenopathy.   Neurological:      Mental Status: She is alert and oriented to person, place, and time.   Psychiatric:         Mood and Affect: Mood and affect and mood normal.         Behavior: Behavior normal.         Thought Content: Thought content normal.         Judgment: Judgment normal.         Procedures    Laboratory Results:  Results for orders placed or performed during the hospital encounter of 02/08/23 (from the past 336 hour(s))   25-OH VITAMIN D (D2 + D3)   Result Value Ref Range    Vitamin D(25-OH)Total 48.9 30 - 80 NG/ML   IONIZED CALCIUM   Result Value Ref Range    Ionized Calcium 1.24 1.0 - 1.3 MMOL/L   PHOSPHORUS   Result Value Ref Range    Phosphorus 3.8 2.0 - 4.5 MG/DL   PARATHYROID HORMONE   Result Value Ref Range    PTH Hormone 51.0 10 - 65 PG/ML   TSH WITH FREE T4 REFLEX   Result Value Ref Range    TSH 2.50 0.35 - 5.00 MCU/ML   COMPREHENSIVE METABOLIC PANEL   Result Value Ref Range    Sodium 138 137 - 147 MMOL/L    Potassium 4.6 3.5 - 5.1 MMOL/L    Chloride 105 98 - 110 MMOL/L    Glucose 84 70 - 100 MG/DL    Blood Urea Nitrogen 20 7 - 25 MG/DL    Creatinine 1.61 0.4 - 1.00 MG/DL    Calcium 9.6 8.5 - 09.6 MG/DL    Total Protein 7.3 6.0 - 8.0 G/DL    Total Bilirubin 0.3 0.2 - 1.3 MG/DL    Albumin 3.9 3.5 - 5.0 G/DL    Alk Phosphatase 045 25 - 110 U/L    AST (SGOT) 45 (H) 7 - 40 U/L    CO2 25 21 - 30 MMOL/L    ALT (SGPT) 40 7 - 56 U/L    Anion Gap 8 3 - 12    eGFR >60 >60 mL/min     *Note: Due to a large number of results and/or encounters for the requested time period, some results have not been displayed. A complete set of results can be found in Results Review.         Radiology Interpretation:      EKG:    Facility Administered Meds:               Clinical Impression:  Holly Maldonado is a 62 y.o. female patient with left maxillary sinus tenderness.  Patient be treated for sinusitis at this time.  Patient also agrees to a round of prednisone burst to help with inflammation and improve symptoms.  Patient advised to follow-up with primary care if this does not improve symptoms.  Patient stated understanding.  Disposition/Follow up       1. Acute maxillary sinusitis, recurrence not specified            Medications:  Encounter Medications   Medications    predniSONE (DELTASONE) 20 mg tablet     Sig: Take two tablets by mouth daily with breakfast for 4 days.     Dispense:  8 tablet     Refill:  0     Order Specific Question:   Collaborating / Supervising Provider     Answer:   Vara Guardian [[id number]]    amoxicillin-potassium clavulanate (AUGMENTIN) 875/125 mg tablet     Sig: Take one tablet by mouth twice daily with meals for 10 days.     Dispense:  20 tablet     Refill:  0     Order Specific Question:   Collaborating / Supervising Provider     Answer:   Vara Guardian [id number]         Procedure Notes:      Patient Instructions:    Patient Instructions     Sinusitis (Antibiotic Treatment)  The sinuses are air-filled spaces within the bones of the face. They connect to the inside of the nose. Sinusitis is an inflammation of the tissue that lines the sinuses. Sinusitis can occur during a cold. It can also happen due to allergies to pollens and other particles in the air. Sinusitis can cause symptoms of sinus congestion and a feeling of fullness. A sinus infection causes fever, headache, and facial pain. There is often green or yellow fluid draining from the nose or into the back of the throat (post-nasal drip). You have been given antibiotics to treat this condition.     Home care  Take the full course of antibiotics as instructed. Don't stop taking them, even when you feel better.  Drink plenty of water, hot tea, and other liquids as directed by the healthcare provider. This may help thin nasal mucus. It also may help your sinuses drain fluids.  Heat may help soothe painful areas of your face. Use a towel soaked in hot water. Or, stand in the shower and direct the warm spray onto your face. Using a vaporizer along with a menthol rub at night may also help soothe symptoms.   An expectorant with guaifenesin may help thin nasal mucus and help your sinuses drain fluids. Talk with your provider or pharmacists before taking an over-the-counter (OTC) medicine if you have any questions about it or its side effects..  You can use an OTC decongestant, unless a similar medicine was prescribed to you. Nasal sprays work the fastest. Use one that contains phenylephrine or oxymetazoline. First blow your nose gently. Then use the spray. Don't use these medicines more often than directed on the label. If you do, your symptoms may get worse. You may also take pills that contain pseudoephedrine. Don?t use products that combine multiple medicines. This is because side effects may be increased. Read labels. You can also ask the pharmacist for help. (People with high blood pressure should not use decongestants. They can raise blood pressure.) Talk with your provider or pharmacist if you have any questions about the medicine..  OTC antihistamines may help if allergies contributed to your sinusitis. Talk with your provider or pharmacist if you have any questions about the medicine.  Nasal rinses or irrigation may help symptoms. It's very important to use these products only as directed. Use sterile water or sterile saline solution and not tap  water. Tap water may contain germs that can cause infection in the brain. Don't rinse with excess pressure. this may spread the infection to other areas in your sinuses or head. Ask your healthcare provider or pharmacist if you have questions about using these products.  Use acetaminophen, naproxen, or ibuprofen to control pain, unless another pain medicine was prescribed to you. Talk with your healthcare provider before using these medicines if you have chronic liver or kidney disease or ever had a stomach ulcer. Never give aspirin to anyone under age 25 without first talking to their healthcare provider. It may cause severe liver damage.  Don't smoke. This can make symptoms worse.     Follow-up care  Follow up with your healthcare provider as advised.   When to seek medical advice  Call your healthcare provider if any of these occur:   Facial pain or headache that gets worse  Symptoms don't go away in 10 days  Fever of 100.4?F (38?C) or higher, or as directed by your healthcare provider  Call 911  Call 911 if any of these occur:   Seizure  Trouble breathing  Feeling dizzy or faint  Fingernails, skin, or lips look blue, purple, or gray  Severe headache that doesn't go away  Stiff neck  Unusual drowsiness or confusion  Vision problems such as blurred or double vision  Swelling of your forehead or eyelids  Prevention  Here are steps you can take to help prevent an infection:   Keep good hand washing habits.  Don?t have close contact with people who have sore throats, colds, or other upper respiratory infections.  Don?t smoke, and stay away from secondhand smoke.  Stay up to date with all of your vaccines.  StayWell last reviewed this educational content on 05/10/2020  ? 2000-2024 [employer], [place]. All rights reserved. This information is not intended as a substitute for professional medical care. Always follow your healthcare professional's instructions.             Gwendolyn Grant, APRN-NP

## 2023-02-13 NOTE — Patient Instructions
Sinusitis (Antibiotic Treatment)  The sinuses are air-filled spaces within the bones of the face. They connect to the inside of the nose. Sinusitis is an inflammation of the tissue that lines the sinuses. Sinusitis can occur during a cold. It can also happen due to allergies to pollens and other particles in the air. Sinusitis can cause symptoms of sinus congestion and a feeling of fullness. A sinus infection causes fever, headache, and facial pain. There is often green or yellow fluid draining from the nose or into the back of the throat (post-nasal drip). You have been given antibiotics to treat this condition.     Home care  Take the full course of antibiotics as instructed. Don't stop taking them, even when you feel better.  Drink plenty of water, hot tea, and other liquids as directed by the healthcare provider. This may help thin nasal mucus. It also may help your sinuses drain fluids.  Heat may help soothe painful areas of your face. Use a towel soaked in hot water. Or, stand in the shower and direct the warm spray onto your face. Using a vaporizer along with a menthol rub at night may also help soothe symptoms.   An expectorant with guaifenesin may help thin nasal mucus and help your sinuses drain fluids. Talk with your provider or pharmacists before taking an over-the-counter (OTC) medicine if you have any questions about it or its side effects..  You can use an OTC decongestant, unless a similar medicine was prescribed to you. Nasal sprays work the fastest. Use one that contains phenylephrine or oxymetazoline. First blow your nose gently. Then use the spray. Don't use these medicines more often than directed on the label. If you do, your symptoms may get worse. You may also take pills that contain pseudoephedrine. Don?t use products that combine multiple medicines. This is because side effects may be increased. Read labels. You can also ask the pharmacist for help. (People with high blood pressure should not use decongestants. They can raise blood pressure.) Talk with your provider or pharmacist if you have any questions about the medicine..  OTC antihistamines may help if allergies contributed to your sinusitis. Talk with your provider or pharmacist if you have any questions about the medicine.  Nasal rinses or irrigation may help symptoms. It's very important to use these products only as directed. Use sterile water or sterile saline solution and not tap water. Tap water may contain germs that can cause infection in the brain. Don't rinse with excess pressure. this may spread the infection to other areas in your sinuses or head. Ask your healthcare provider or pharmacist if you have questions about using these products.  Use acetaminophen, naproxen, or ibuprofen to control pain, unless another pain medicine was prescribed to you. Talk with your healthcare provider before using these medicines if you have chronic liver or kidney disease or ever had a stomach ulcer. Never give aspirin to anyone under age 18 without first talking to their healthcare provider. It may cause severe liver damage.  Don't smoke. This can make symptoms worse.    Follow-up care  Follow up with your healthcare provider as advised.   When to seek medical advice  Call your healthcare provider if any of these occur:   Facial pain or headache that gets worse  Symptoms don't go away in 10 days  Fever of 100.4?F (38?C) or higher, or as directed by your healthcare provider  Call 911  Call 911 if any of these occur:     Seizure  Trouble breathing  Feeling dizzy or faint  Fingernails, skin, or lips look blue, purple, or gray  Severe headache that doesn't go away  Stiff neck  Unusual drowsiness or confusion  Vision problems such as blurred or double vision  Swelling of your forehead or eyelids  Prevention  Here are steps you can take to help prevent an infection:   Keep good hand washing habits.  Don?t have close contact with people who have sore throats, colds, or other upper respiratory infections.  Don?t smoke, and stay away from secondhand smoke.  Stay up to date with all of your vaccines.  StayWell last reviewed this educational content on 05/10/2020  ? 2000-2024 [employer]. All rights reserved. This information is not intended as a substitute for professional medical care. Always follow your healthcare professional's instructions.

## 2023-02-14 ENCOUNTER — Encounter: Admit: 2023-02-14 | Discharge: 2023-02-14 | Payer: MEDICARE

## 2023-02-14 DIAGNOSIS — Z801 Family history of malignant neoplasm of trachea, bronchus and lung: Secondary | ICD-10-CM

## 2023-02-14 DIAGNOSIS — Z8042 Family history of malignant neoplasm of prostate: Secondary | ICD-10-CM

## 2023-02-14 DIAGNOSIS — Z808 Family history of malignant neoplasm of other organs or systems: Secondary | ICD-10-CM

## 2023-02-14 DIAGNOSIS — E278 Other specified disorders of adrenal gland: Secondary | ICD-10-CM

## 2023-02-14 DIAGNOSIS — E213 Hyperparathyroidism, unspecified: Secondary | ICD-10-CM

## 2023-02-14 DIAGNOSIS — D497 Neoplasm of unspecified behavior of endocrine glands and other parts of nervous system: Secondary | ICD-10-CM

## 2023-02-14 DIAGNOSIS — Z803 Family history of malignant neoplasm of breast: Secondary | ICD-10-CM

## 2023-02-14 NOTE — Progress Notes
NAME: Holly Maldonado  MRN: [place]# [id number]  DOB: 06/10/1960    [facility]   Genetic Counseling  Documentation Date: 02/14/2023     Holly Maldonado's genetic test results were released and are NEGATIVE (normal). Holly Maldonado that results be uploaded via MyChart without a phone call notification if they are negative (normal); therefore, this documentation serves the purpose of test result notification.     Summary of Results   Holly Maldonado's testing did not find a mutation in any of the analyzed genes associated with hereditary cancer syndromes.   Cancer risks, management, and surveillance options with consideration of her personal and family history risk factors are listed below.   Holly Maldonado was sent a copy of these results via MyChart.   No additional referrals were needed at this time.     Genetic Test Results   Testing was ordered for Holly Maldonado based on her diagnosis of tumors and family history of cancer. Included within the testing that was performed were genes that, when mutated, increases an individual's risk for cancer. The testing that was ordered was:     Holly Maldonado' CustomNext-Cancer? +RNAinsight?:  85 genes: AIP, ALK, APC, ATM, AXIN2, BAP1, BARD1, BLM, BMPR1A, BRCA1, BRCA2, BRIP1, CDC73, CDH1, CDK4, CDKN1B, CDKN2A, CHEK2, CTNNA1, DICER1, FANCC, FH, FLCN, GALNT12, KIF1B, LZTR1, MAX, MEN1, MET, MLH1, MRE11A, MSH2, MSH3, MSH6, MUTYH, NBN, NF1, NF2, NTHL1, PALB2, PHOX2B, PMS2, POT1, PRKAR1A, PTCH1, PTEN, RAD50, RAD51C, RAD51D, RB1, RECQL, RET, SDHA, SDHAF2, SDHB, SDHC, SDHD, SMAD4, SMARCA4, SMARCB1, SMARCE1, STK11, SUFU, TMEM127, TP53, TSC1, TSC2, VHL and XRCC2 (sequencing and deletion/duplication); EGFR, EGLN1, FAM175A, HOXB13, KIT, MITF, MLH3, PALLD, PDGFRA, POLD1, POLE, RINT1, RPS20 and TERT (sequencing only); EPCAM and GREM1 (deletion/duplication only). RNA data is routinely analyzed for use in variant interpretation for all genes    Results: NEGATIVE     The test report will be scanned and uploaded under Genetic Test in the patient's Labs tab in their chart following today's appointment.     Details and Further Considerations   Multi-Gene Panel Testing Result (Negative)  Holly Maldonado testing did not find a mutation in any of the analyzed genes associated with hereditary cancer syndromes. This means that at this time we do not have a hereditary genetic explanation for the cause of Holly Maldonado history nor her family history of cancer.     Negative genetic testing results can be explained by several possibilities:  Currently methodology for genetic testing identifies most gene mutations; however, a small percentage of mutations may be unidentified with current technology. This means that, even with a negative test result, a hereditary cancer predisposition cannot be fully ruled out for the patient. Additionally, without a known familial variant in the family, the patient is still at an elevated risk for cancer in comparison to her peers.   There could be genes that were not evaluated on this genetic test and there could be genes that have yet to be discovered that have an associated hereditary cancer predisposition/syndrome that there is no available testing for at this time.   The cancer in the family could be due to something beyond an inherited gene predisposition. This would mean that cancer is sporadic (due to chance).     Management Consideration(s)  As a reminder, the American Cancer Society recommends preventative cancer screening for all individuals, even if their (or a family member's) genetic testing was negative. These screenings include:   Screening for cervical cancer via a Pap test starting at age 75 (females).  Screening for breast cancer via yearly mammograms starting at age 73-50 (females).   Screening for prostate cancer via a PSA blood test and/or digital rectal exam starting at age 69 (males).  Screening for colorectal cancer via colonoscopy starting at age 55 (males and females).  Exceptions: These screenings may be recommended to occur at an earlier or differing age based on a family history of cancer and should be discussed with your medical team.      Familial Recommendations  There may still be a hereditary cancer syndrome in Holly Maldonado's family that she just did not inherit. We recommended having Holly Maldonado have genetic counseling and testing due to their Maldonado's history of metastatic prostate cancer. If there is a pathogenic (cancer-causing) genetic mutation found in Holly Maldonado family members, that information would greatly reduce Holly Maldonado's risk for related cancers due to her testing being negative. If family members would like to pursue genetic testing at [facility], they can make an appointment with our offices at [phone]. If family members live outside [place] or [place], they can find a Patent attorney through [email or web address] website or at [email or web address]      It is still possible that Holly Maldonado's children could have inherited a hereditary cancer syndrome from their Maldonado's family. If there is a paternal family history of cancer, we recommend they consider seeing a genetic counselor to review the family history and determine if genetic testing is appropriate.         If Holly Maldonado's female relatives are worried about their risk for breast cancer, they may qualify for additional screening. If an individual's lifetime risk for breast cancer is 20% or higher, they are considered to be high risk to develop breast cancer. Recommendations for these women are to alternate breast MRIs and mammograms every 6 months. If they would like to discuss their breast cancer risk and screening, they can make an appointment with the [facility] at [phone] or to get more information they can visit: [email or web address]    Plan   Holly Maldonado should contact the [facility] if there are any new cancer diagnoses in the family and/or in a few years to see if there may be additional genetic testing or improved testing technologies that have been developed that would be worthwhile for herself or for her family members.   A copy of today's documentation will be routed to [facility] via [place] and a copy of the results will be sent to her via MyChart Patient Message.       We remain available at the [facility] at [phone] for any questions or concerns about the information that was covered during this appointment.     Time: Indirect patient care: 30 minutes.  Discussed test results and implications for family members. Determined who in the family still should consider pursuing genetic testing.     Lamona Curl, MS, [facility] (she/her)  Certified Genetic Counselor  [facility]  [address] [place], [place]  [address]    Email: [email or web address]  Telephone: [phone]  Fax: [phone]  Appointments: [phone]

## 2023-02-15 ENCOUNTER — Encounter: Admit: 2023-02-15 | Discharge: 2023-02-15 | Payer: MEDICARE

## 2023-02-21 ENCOUNTER — Encounter: Admit: 2023-02-21 | Discharge: 2023-02-21 | Payer: MEDICARE

## 2023-02-24 ENCOUNTER — Encounter: Admit: 2023-02-24 | Discharge: 2023-02-24 | Payer: MEDICARE

## 2023-03-03 ENCOUNTER — Encounter: Admit: 2023-03-03 | Discharge: 2023-03-03 | Payer: MEDICARE

## 2023-03-03 MED ORDER — POTASSIUM CITRATE 10 MEQ (1,080 MG) PO TBER
20 meq | ORAL_TABLET | Freq: Two times a day (BID) | ORAL | 1 refills | Status: AC
Start: 2023-03-03 — End: ?

## 2023-03-03 MED ORDER — EZETIMIBE 10 MG PO TAB
10 mg | ORAL_TABLET | Freq: Every day | ORAL | 3 refills | Status: AC
Start: 2023-03-03 — End: ?

## 2023-03-04 ENCOUNTER — Encounter: Admit: 2023-03-04 | Discharge: 2023-03-04 | Payer: MEDICARE

## 2023-03-04 ENCOUNTER — Ambulatory Visit: Admit: 2023-03-04 | Discharge: 2023-03-05 | Payer: MEDICARE

## 2023-03-04 DIAGNOSIS — I499 Cardiac arrhythmia, unspecified: Secondary | ICD-10-CM

## 2023-03-04 DIAGNOSIS — E279 Disorder of adrenal gland, unspecified: Secondary | ICD-10-CM

## 2023-03-04 DIAGNOSIS — J302 Other seasonal allergic rhinitis: Secondary | ICD-10-CM

## 2023-03-04 DIAGNOSIS — G4730 Sleep apnea, unspecified: Secondary | ICD-10-CM

## 2023-03-04 DIAGNOSIS — F32A Depressed: Secondary | ICD-10-CM

## 2023-03-04 DIAGNOSIS — E7800 Pure hypercholesterolemia, unspecified: Secondary | ICD-10-CM

## 2023-03-04 DIAGNOSIS — D539 Nutritional anemia, unspecified: Secondary | ICD-10-CM

## 2023-03-04 DIAGNOSIS — R000 Tachycardia, unspecified: Secondary | ICD-10-CM

## 2023-03-04 DIAGNOSIS — N390 Urinary tract infection, site not specified: Secondary | ICD-10-CM

## 2023-03-04 DIAGNOSIS — G4711 Idiopathic hypersomnia with long sleep time: Secondary | ICD-10-CM

## 2023-03-04 DIAGNOSIS — D352 Benign neoplasm of pituitary gland: Secondary | ICD-10-CM

## 2023-03-04 DIAGNOSIS — Z8659 Personal history of other mental and behavioral disorders: Secondary | ICD-10-CM

## 2023-03-04 DIAGNOSIS — Q615 Medullary cystic kidney: Secondary | ICD-10-CM

## 2023-03-04 DIAGNOSIS — E785 Hyperlipidemia, unspecified: Secondary | ICD-10-CM

## 2023-03-04 DIAGNOSIS — E039 Hypothyroidism, unspecified: Secondary | ICD-10-CM

## 2023-03-04 DIAGNOSIS — G4733 Obstructive sleep apnea (adult) (pediatric): Secondary | ICD-10-CM

## 2023-03-04 DIAGNOSIS — F4310 Post-traumatic stress disorder, unspecified: Secondary | ICD-10-CM

## 2023-03-04 DIAGNOSIS — F1020 Alcohol dependence, uncomplicated: Secondary | ICD-10-CM

## 2023-03-04 DIAGNOSIS — K219 Gastro-esophageal reflux disease without esophagitis: Secondary | ICD-10-CM

## 2023-03-04 DIAGNOSIS — F99 Mental disorder, not otherwise specified: Secondary | ICD-10-CM

## 2023-03-04 DIAGNOSIS — Z720 Tobacco use: Secondary | ICD-10-CM

## 2023-03-04 DIAGNOSIS — M1990 Unspecified osteoarthritis, unspecified site: Secondary | ICD-10-CM

## 2023-03-04 MED ORDER — SUNOSI 75 MG PO TAB
75 mg | ORAL_TABLET | Freq: Every morning | ORAL | 0 refills | Status: AC
Start: 2023-03-04 — End: ?
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
## 2023-03-08 ENCOUNTER — Encounter: Admit: 2023-03-08 | Discharge: 2023-03-08 | Payer: MEDICARE

## 2023-03-08 NOTE — Telephone Encounter
Prior authorization request received for patient's Sunosi 75mg .  Authorization submitted to insurance via Cover My Meds.  Key: [id number]

## 2023-03-09 ENCOUNTER — Encounter: Admit: 2023-03-09 | Discharge: 2023-03-09 | Payer: MEDICARE

## 2023-03-09 ENCOUNTER — Emergency Department: Admit: 2023-03-09 | Discharge: 2023-03-09 | Disposition: A | Discharge status: Home / Self Care | Payer: MEDICARE

## 2023-03-09 ENCOUNTER — Emergency Department: Admit: 2023-03-09 | Discharge: 2023-03-09 | Payer: MEDICARE

## 2023-03-09 DIAGNOSIS — E7800 Pure hypercholesterolemia, unspecified: Secondary | ICD-10-CM

## 2023-03-09 DIAGNOSIS — E559 Vitamin D deficiency, unspecified: Secondary | ICD-10-CM

## 2023-03-14 ENCOUNTER — Encounter: Admit: 2023-03-14 | Discharge: 2023-03-14 | Payer: MEDICARE

## 2023-03-14 ENCOUNTER — Ambulatory Visit: Admit: 2023-03-14 | Discharge: 2023-03-15 | Payer: MEDICARE

## 2023-03-14 DIAGNOSIS — E039 Hypothyroidism, unspecified: Secondary | ICD-10-CM

## 2023-03-14 DIAGNOSIS — D352 Benign neoplasm of pituitary gland: Secondary | ICD-10-CM

## 2023-03-14 DIAGNOSIS — E213 Hyperparathyroidism, unspecified: Secondary | ICD-10-CM

## 2023-03-15 ENCOUNTER — Encounter: Admit: 2023-03-15 | Discharge: 2023-03-15 | Payer: MEDICARE

## 2023-03-15 DIAGNOSIS — N200 Calculus of kidney: Secondary | ICD-10-CM

## 2023-03-16 ENCOUNTER — Encounter: Admit: 2023-03-16 | Discharge: 2023-03-16 | Payer: MEDICARE

## 2023-03-20 ENCOUNTER — Encounter: Admit: 2023-03-20 | Discharge: 2023-03-20 | Payer: MEDICARE

## 2023-03-21 ENCOUNTER — Encounter: Admit: 2023-03-21 | Discharge: 2023-03-21 | Payer: MEDICARE

## 2023-03-22 ENCOUNTER — Encounter: Admit: 2023-03-22 | Discharge: 2023-03-22 | Payer: MEDICARE

## 2023-03-22 ENCOUNTER — Ambulatory Visit: Admit: 2023-03-22 | Discharge: 2023-03-23 | Payer: MEDICARE

## 2023-03-22 ENCOUNTER — Ambulatory Visit: Admit: 2023-03-22 | Discharge: 2023-03-22 | Payer: MEDICARE

## 2023-03-22 DIAGNOSIS — E785 Hyperlipidemia, unspecified: Secondary | ICD-10-CM

## 2023-03-22 DIAGNOSIS — E559 Vitamin D deficiency, unspecified: Secondary | ICD-10-CM

## 2023-03-23 ENCOUNTER — Encounter: Admit: 2023-03-23 | Discharge: 2023-03-23 | Payer: MEDICARE

## 2023-03-23 DIAGNOSIS — E559 Vitamin D deficiency, unspecified: Secondary | ICD-10-CM

## 2023-03-23 DIAGNOSIS — E785 Hyperlipidemia, unspecified: Secondary | ICD-10-CM

## 2023-03-27 ENCOUNTER — Encounter: Admit: 2023-03-27 | Discharge: 2023-03-27 | Payer: MEDICARE

## 2023-03-30 ENCOUNTER — Encounter: Admit: 2023-03-30 | Discharge: 2023-03-30 | Payer: MEDICARE

## 2023-03-30 MED ORDER — REPATHA SURECLICK 140 MG/ML SC PNIJ
140 mg | SUBCUTANEOUS | 11 refills | 28.00000 days | Status: AC
Start: 2023-03-30 — End: ?
  Filled 2023-04-10: qty 2, 28d supply, fill #1

## 2023-03-31 ENCOUNTER — Encounter: Admit: 2023-03-31 | Discharge: 2023-03-31 | Payer: MEDICARE

## 2023-03-31 NOTE — Progress Notes
Pharmacy Benefits Investigation    Medication name: evolocumab (REPATHA SURECLICK) 140 mg/mL injectable PEN  Medication status: new    The insurance requires a prior authorization for the medication. The prior authorization was submitted via CoverMyMeds.    PA number: [id number]    Lucie Leather  Specialty Pharmacy Patient Advocate

## 2023-04-02 ENCOUNTER — Encounter: Admit: 2023-04-02 | Discharge: 2023-04-02 | Payer: MEDICARE

## 2023-04-05 ENCOUNTER — Encounter: Admit: 2023-04-05 | Discharge: 2023-04-05 | Payer: MEDICARE

## 2023-04-05 NOTE — Progress Notes
Pharmacy Benefits Investigation    Medication name: evolocumab (REPATHA SURECLICK) 140 mg/mL injectable PEN  Medication status: new    The prior authorization was approved for Holly Maldonado (PA number [phone]) from 03/31/2023 through 05/09/2024.    The out of pocket cost today is $11.2. This cost may change due to factors including but not limited to changes in insurance coverage.    A grant is available through [employer].    After assistance, the final out of pocket cost is $0.    The copay is affordable. Per patient's request, the medication will be shipped to the patient's address.    Lucie Leather  Specialty Pharmacy Patient Advocate

## 2023-04-09 ENCOUNTER — Encounter: Admit: 2023-04-09 | Discharge: 2023-04-09 | Payer: MEDICARE

## 2023-04-10 ENCOUNTER — Encounter: Admit: 2023-04-10 | Discharge: 2023-04-10 | Payer: MEDICARE

## 2023-04-20 ENCOUNTER — Encounter: Admit: 2023-04-20 | Discharge: 2023-04-20 | Payer: MEDICARE

## 2023-04-20 DIAGNOSIS — N200 Calculus of kidney: Secondary | ICD-10-CM

## 2023-04-20 DIAGNOSIS — E215 Disorder of parathyroid gland, unspecified: Secondary | ICD-10-CM

## 2023-04-21 ENCOUNTER — Encounter: Admit: 2023-04-21 | Discharge: 2023-04-21 | Payer: MEDICARE

## 2023-04-21 MED ORDER — ROSUVASTATIN 10 MG PO TAB
10 mg | ORAL_TABLET | Freq: Every day | ORAL | 3 refills | 90.00000 days | Status: AC
Start: 2023-04-21 — End: ?

## 2023-04-25 ENCOUNTER — Encounter: Admit: 2023-04-25 | Discharge: 2023-04-25 | Payer: MEDICARE

## 2023-04-25 ENCOUNTER — Ambulatory Visit: Admit: 2023-04-25 | Discharge: 2023-04-26 | Payer: MEDICARE

## 2023-04-27 ENCOUNTER — Encounter: Admit: 2023-04-27 | Discharge: 2023-04-27 | Payer: MEDICARE

## 2023-05-12 ENCOUNTER — Encounter: Admit: 2023-05-12 | Discharge: 2023-05-12 | Payer: MEDICARE

## 2023-05-13 ENCOUNTER — Encounter: Admit: 2023-05-13 | Discharge: 2023-05-13 | Payer: MEDICARE

## 2023-05-17 ENCOUNTER — Encounter: Admit: 2023-05-17 | Discharge: 2023-05-17 | Payer: MEDICARE

## 2023-05-17 DIAGNOSIS — G4711 Idiopathic hypersomnia with long sleep time: Secondary | ICD-10-CM

## 2023-05-17 MED ORDER — METHYLPHENIDATE HCL 10 MG PO TAB
10 mg | ORAL_TABLET | Freq: Every day | ORAL | 0 refills | Status: AC
Start: 2023-05-17 — End: ?

## 2023-05-18 ENCOUNTER — Encounter: Admit: 2023-05-18 | Discharge: 2023-05-18 | Payer: MEDICARE

## 2023-05-19 ENCOUNTER — Encounter: Admit: 2023-05-19 | Discharge: 2023-05-19 | Payer: MEDICARE

## 2023-05-19 MED FILL — REPATHA SURECLICK 140 MG/ML SC PNIJ: 140 mg/mL | SUBCUTANEOUS | 28 days supply | Qty: 2 | Fill #2 | Status: AC

## 2023-05-26 ENCOUNTER — Encounter: Admit: 2023-05-26 | Discharge: 2023-05-26 | Payer: MEDICARE

## 2023-06-01 ENCOUNTER — Encounter: Admit: 2023-06-01 | Discharge: 2023-06-01 | Payer: MEDICARE

## 2023-06-02 ENCOUNTER — Encounter: Admit: 2023-06-02 | Discharge: 2023-06-02 | Payer: MEDICARE

## 2023-06-06 ENCOUNTER — Ambulatory Visit: Admit: 2023-06-06 | Discharge: 2023-06-07 | Payer: MEDICARE

## 2023-06-06 ENCOUNTER — Encounter: Admit: 2023-06-06 | Discharge: 2023-06-06 | Payer: MEDICARE

## 2023-06-08 ENCOUNTER — Ambulatory Visit: Admit: 2023-06-08 | Discharge: 2023-06-08 | Payer: MEDICARE

## 2023-06-08 ENCOUNTER — Encounter: Admit: 2023-06-08 | Discharge: 2023-06-08 | Payer: MEDICARE

## 2023-06-08 DIAGNOSIS — N200 Calculus of kidney: Secondary | ICD-10-CM

## 2023-06-08 DIAGNOSIS — E213 Hyperparathyroidism, unspecified: Secondary | ICD-10-CM

## 2023-06-08 NOTE — Progress Notes
History      CC: Nephrolithiasis.    HPI: Holly Maldonado is a 63 y.o. female with past medical history of nephrolithiasis and medullary sponge kidney disease.  Nephrolithiasis: First stone episode was 22 years ago. Stone was not analyzed.  Most recent stones were found incidentally on renal ultrasound from 09/01.  Fluid intake:3-4 glasses daily.  Salt: does not add excess salt.  Dairy: does not drink milk, eats cheese.  Surgeries: none  Denies hx of chronic diarrhea.  Interval history: no significant health events. She has not passed any kidney stone.  She denies flank pain. She admits she needs to increase her water intake.     Interval history 01-27-2022: She has not passed any kidney stone. Denies flank / abdominal pain.  Fluid: 30-40 fl oz. Sometimes a cup of coffee.  Salt/ dairy intake unchanged.    Interval history 04-28-22: She has increased her fluid intake and drinks about 60-90 fl oz of water. No passage of kidney stone since her last visit.      Interval history 10-13-2022: No major interval health events. She has not passed any kidney stone since the last appointment. She completed 24 hour urine collection in the last 2 weeks and her results are pending.  She drinks more than 47fl oz of water daily.     Interval history 06-08-2023: She had covid-19 infection last fall. No passage of a kidney stone since her last appointment.   Fluid intake: 1cup of coffee and 5-6 glasses of water. She pays attention to her salt intake and does not add salt to her meals. She also admits to eating mostly processed and frozen dinners.    Peanut,   Chocolate No dairy    Past Medical History   Past Medical History:    Adrenal nodule (HCC)    Alcoholism (HCC)    Arrhythmia    Arthritis    depression    Dyslipidemia    Endometriosis    Esophageal reflux    H/O eating disorder    Hematuria    High cholesterol    Hypothyroid    Medullary sponge kidney    Mental disorder    Nephrocalcinosis    Obstructive sleep apnea    Pituitary adenoma (HCC)    PTSD (post-traumatic stress disorder)    Recurrent UTI    Seasonal allergic reaction    Sleep apnea    Tachycardia    Tobacco abuse    Unspecified deficiency anemia        Family History  Family History   Problem Relation Name Age of Onset    Diabetes Type II Mother Chales Abrahams     Heart Attack Mother Chales Abrahams         Died at 7 from heart attack    Allergy-severe Mother Chales Abrahams     Anesthetic Complication Mother Chales Abrahams     Cancer-Hematologic Mother Chales Abrahams 51        CLL (chronic lymphocytic leukemia)    Coronary Artery Disease Father Louanne Skye     None Reported Brother Thurmond Butts     None Reported Daughter Erie Noe     None Reported Daughter [place]         precancerous moles removed    None Reported Daughter Allysan         precancerous moles removed    None Reported Son John     None Reported Son Maurice March     Coronary Artery Disease Maternal Grandmother Britta Mccreedy  Circulatory problem Maternal Grandmother Barbara     Coronary Artery Disease Maternal Grandfather Ray     Heart Attack Maternal Grandfather Ray     Heart Surgery Maternal Grandfather Ray     Coronary Artery Disease Paternal Grandmother Bertilla     Cancer-Prostate Paternal Lisa Roca 70        mets to bones    Coronary Artery Disease Paternal Grandfather Adela Glimpse     Cancer-Breast Maternal Aunt  19    Cancer-Lung Other Half aunt viaMGM, Kendal Hymen 20        SCCL mets to brain    Cancer-Lung Other Half uncle via MGM(2) 60        mets to brain    Cancer-Skin Other Half uncle via MGF         mets    Glaucoma Neg Hx      Macular Degen Neg Hx      Strabismus Neg Hx      Retinal Detachment Neg Hx      Blindness Neg Hx      Cataract Neg Hx      Amblyopia Neg Hx          Social History  Social History     Socioeconomic History    Marital status: Divorced    Number of children: 5   Tobacco Use    Smoking status: Former     Current packs/day: 0.00     Average packs/day: 1 pack/day for 7.0 years (7.0 ttl pk-yrs)     Types: Cigarettes     Start date: 08/22/1985     Quit date: 08/22/1992     Years since quitting: 30.8    Smokeless tobacco: Never   Vaping Use    Vaping status: Never Used   Substance and Sexual Activity    Alcohol use: No    Drug use: No    Sexual activity: Not Currently     Partners: Male     Birth control/protection: Post-menopausal        Medication    HOME MEDS   aspirin EC (ASPIR-LOW) 81 mg tablet Take one tablet by mouth daily.    docosahexaenoic acid/epa (FISH OIL PO) Take 3 capsules by mouth daily.    evolocumab (REPATHA SURECLICK) 140 mg/mL injectable PEN Inject 1 mL under the skin every 14 days.    ezetimibe (ZETIA) 10 mg tablet Take one tablet by mouth daily.    famotidine (PEPCID) 40 mg tablet Take one tablet by mouth twice daily.    fezolinetant (VEOZAH) 45 mg tablet Take one tablet by mouth daily.    flecainide (TAMBOCOR) 100 mg tablet TAKE 1/2 TABLET TWICE A DAY BY MOUTH    FLUoxetine (PROZAC) 20 mg capsule Take one capsule by mouth daily. Take one capsule (20mg ) by mouth daily with additional two 40mg  capsules (80mg ) for total daily dose of 100mg .    FLUoxetine (PROZAC) 40 mg capsule TAKE TWO CAPSULES (80MG ) BY MOUTH DAILY WITH ONE 20MG  CAPSULE FOR TOTAL DAILY DOSE OF 100MG .    lamoTRIgine (LAMICTAL) 100 mg tablet Take two tablets by mouth daily.    levothyroxine (SYNTHROID) 88 mcg tablet Take one tablet by mouth daily 30 minutes before breakfast.    magnesium oxide (MAG-OX) 400 mg (241.3 mg magnesium) tablet Take three tablets by mouth at bedtime daily. 1,200 mg = 3 tablets    methylphenidate HCl (RITALIN) 10 mg tablet Take one tablet by mouth daily with breakfast.    other medication CITRUS BERGAMOT  supplement. 1 tablet in the morning  Indications: for cholesterol    potassium citrate (UROCIT-K 10) 10 mEq (1,080 mg) tablet Take two tablets by mouth twice daily. Take with food.    rosuvastatin (CRESTOR) 10 mg tablet TAKE 1 TABLET BY MOUTH EVERY DAY          Review of Systems  Constitutional: negative  Eyes: negative  Ears, nose, mouth, throat, and face: negative  Respiratory: negative  Cardiovascular: negative  Gastrointestinal: negative  Genitourinary:negative  Integument/breast: negative  Hematologic/lymphatic: negative  Musculoskeletal:negative  Neurological: negative  Endocrine: negative      Physical Exam        Vitals:    06/08/23 0857   BP: 124/79   BP Source: Arm, Left Upper   Pulse: 83   PainSc: Zero   Weight: 81.8 kg (180 lb 4.8 oz)   Height: 162.6 cm (5' 4)           Body mass index is 30.95 kg/m?.    Gen: Alert and Oriented, No Acute Distress   HEENT: Sclera normal; MMM  CV:  S1 and S2 normal, no rubs, murmurs or gallops   Pulm: Clear to Auscultation bilateral   GI: BS+ x4, non-tender to palpation  Neuro: Grossly normal, moving all extremities, speech intact  Ext: no edema, clubbing or cyanosis   Skin: no rash     Assessment and Plan        Elanie Hammitt is a 63 y.o. female        -Nephrolithiasis: Passed  stone more than 20 years ago.  Most recent CT scan shows several 2 to 3 mm right renal calculi.    She was started on potassium citrate in 04/2022 but we have had to stop it due to high ph and high cap ss. We stopped thiazide due to hypercalcemia.    24 hour urine reports high salt intake which she identified frozen dinners and processed foods as the source.  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 Ascorbic acid was positive which could be due to the supplement  citrus bergamot . In addition peanuts and abstaining from dairy products may increase oxalate absorption.  Her urine calcium remains elevated which could be due to the elevated urine sodium. We discussed decreasing salt intake.  We also discussed increasing her fluid intake to at least 23fl oz.           - Hyperparathyroidism: PTH is elevated with normal vitamin D, phosphate and calcium at upper limit of normal.  She has history of pituitary adenoma, which resolved without treatment. 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  She is hypothyroid. She was discussed at [facility] board meeting where the consensus was to hold off with parathyroidectomy and continue with measures including reduction in salt intake.    - Medullary sponge kidney disease: We do not have access to the primary radiological diagnosis. eGFR is preserved at this point. Continue to monitor.    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    Doran Durand, MD      There are no Patient Instructions on file for this visit.

## 2023-06-08 NOTE — Progress Notes
Order from Dr Philomena Course for [facility] testing to be completed before next visit in July 202. [place] request sent with delay shipment requested for 10/05/23:   Date: Jan/29/2025 2:13:24 PM (-0600 GMT)  Scanned Pages: 1  ----- Destination -----  [id number] Success  ----- Additional Details -----  Job  Date/Time  Type  Line   Identification  Duration  Pages  Result     [id number]  Jan/29/2025 2:11:57 PM  Send  Analog [id number]  01:22     1      Success

## 2023-06-09 ENCOUNTER — Encounter: Admit: 2023-06-09 | Discharge: 2023-06-09 | Payer: MEDICARE

## 2023-06-12 ENCOUNTER — Encounter: Admit: 2023-06-12 | Discharge: 2023-06-12 | Payer: MEDICARE

## 2023-06-13 ENCOUNTER — Encounter: Admit: 2023-06-13 | Discharge: 2023-06-13 | Payer: MEDICARE

## 2023-06-14 ENCOUNTER — Encounter: Admit: 2023-06-14 | Discharge: 2023-06-14 | Payer: MEDICARE

## 2023-06-14 MED FILL — REPATHA SURECLICK 140 MG/ML SC PNIJ: 140 mg/mL | SUBCUTANEOUS | 28 days supply | Qty: 2 | Fill #3 | Status: AC

## 2023-06-15 ENCOUNTER — Encounter: Admit: 2023-06-15 | Discharge: 2023-06-15 | Payer: MEDICARE

## 2023-06-16 ENCOUNTER — Encounter: Admit: 2023-06-16 | Discharge: 2023-06-16 | Payer: MEDICARE

## 2023-06-16 NOTE — Telephone Encounter
Contacted pt to bring inspire remote for upcoming appointment.

## 2023-06-17 ENCOUNTER — Encounter: Admit: 2023-06-17 | Discharge: 2023-06-17 | Payer: MEDICARE

## 2023-06-17 ENCOUNTER — Ambulatory Visit: Admit: 2023-06-17 | Discharge: 2023-06-17 | Payer: MEDICARE

## 2023-06-17 DIAGNOSIS — G4711 Idiopathic hypersomnia with long sleep time: Secondary | ICD-10-CM

## 2023-06-17 DIAGNOSIS — G4733 Obstructive sleep apnea (adult) (pediatric): Secondary | ICD-10-CM

## 2023-06-17 MED ORDER — LISDEXAMFETAMINE 10 MG PO CAP
10 mg | ORAL_CAPSULE | Freq: Every morning | ORAL | 0 refills | Status: AC
Start: 2023-06-17 — End: ?

## 2023-06-17 NOTE — Progress Notes
 Subjective:       History of Present Illness  Holly Maldonado is a 63 y.o. female.  She was last seen in October.  She took [facility] after that visit and thinks she took the entire course but can't recall why she stopped taking after a month.    She thinks she had no response at lower dose then had issues with tachycardia at higher dose or 75 mg pill.    She then started on Ritalin 10 mg earlier this month but felt anxious and angry.    She also didn't think it help    She has taken Vyvanse and Adderall in the past and both worked but she had issues with PVC's.  She thinks she ended up on flecainide due to this.  She actually didn't even notice this issue and was never noticed the issue.  She thinks it may been due to tachycardia.    She remains on flecainide even though she stopped all stimulant therapy.   She actually has not seen him in a couple years.    She will still forget to turn on Inspire about one night per week since so tired she will fall asleep before turning it on.  She actually can't tell much difference with or without the device.  She can still take a nap within an hour falling asleep.    She has actually had MSLT a couple times to her recall with initial one around 1990's in [place] but was told she did not have narcolepsy.  She thinks had another one with Dr. Ellwood Handler in 04/2017 and was told to stop all her medications at that time.  She was told she did not have narcolepsy but likely IH at that time.  She is currently going to bed around 1 pm and getting up around 8 am.  She thinks she is getting about 8-9 hours per night and will still take a nap most days for about one hour.  She will stay in bed on weekends until 930 am.     She is now seeing a new psychiatrist but apparently will have to switch since her insurance was not covered.  She thinks the group is called, [facility].  They are planning on transitioning from Prozac to Effexor.    She does still feel depressed and feels less social and not leaving the house as much.   Her Epworth Sleepiness Scale is 18/24.       Review of Systems  Per HPI    Objective:          aspirin EC (ASPIR-LOW) 81 mg tablet Take one tablet by mouth daily.    docosahexaenoic acid/epa (FISH OIL PO) Take 3 capsules by mouth daily.    evolocumab (REPATHA SURECLICK) 140 mg/mL injectable PEN Inject 1 mL under the skin every 14 days.    ezetimibe (ZETIA) 10 mg tablet Take one tablet by mouth daily.    famotidine (PEPCID) 40 mg tablet Take one tablet by mouth twice daily.    fezolinetant (VEOZAH) 45 mg tablet Take one tablet by mouth daily.    flecainide (TAMBOCOR) 100 mg tablet TAKE 1/2 TABLET TWICE A DAY BY MOUTH    FLUoxetine (PROZAC) 20 mg capsule Take one capsule by mouth daily. Take one capsule (20mg ) by mouth daily with additional two 40mg  capsules (80mg ) for total daily dose of 100mg .    FLUoxetine (PROZAC) 40 mg capsule TAKE TWO CAPSULES (80MG ) BY MOUTH DAILY WITH ONE  20MG  CAPSULE FOR TOTAL DAILY DOSE OF 100MG .    lamoTRIgine (LAMICTAL) 100 mg tablet Take two tablets by mouth daily.    levothyroxine (SYNTHROID) 88 mcg tablet Take one tablet by mouth daily 30 minutes before breakfast.    lisdexamfetamine (VYVANSE) 10 mg capsule Take one capsule by mouth every morning.    magnesium oxide (MAG-OX) 400 mg (241.3 mg magnesium) tablet Take three tablets by mouth at bedtime daily. 1,200 mg = 3 tablets    methylphenidate HCl (RITALIN) 10 mg tablet Take one tablet by mouth daily with breakfast.    other medication CITRUS BERGAMOT supplement. 1 tablet in the morning  Indications: for cholesterol    potassium citrate (UROCIT-K 10) 10 mEq (1,080 mg) tablet Take two tablets by mouth twice daily. Take with food.    rosuvastatin (CRESTOR) 10 mg tablet TAKE 1 TABLET BY MOUTH EVERY DAY     Vitals:    06/17/23 0954   BP: 103/70   BP Source: Arm, Right Upper   Pulse: 72   Temp: 36.6 ?C (97.9 ?F)   Resp: 18   SpO2: 95%   TempSrc: Oral   PainSc: Zero   Weight: 81.6 kg (180 lb)   Height: 162.6 cm (5' 4)     Body mass index is 30.9 kg/m?Marland Kitchen     Physical Exam  Vitals and nursing note reviewed.   Constitutional:       Appearance: Normal appearance. She is obese.   HENT:      Head: Normocephalic and atraumatic.      Nose: Nose normal.      Mouth/Throat:      Mouth: Mucous membranes are moist.      Pharynx: Oropharynx is clear.   Neck:      Comments: Well-healed right neck incision.  Cardiovascular:      Rate and Rhythm: Normal rate and regular rhythm.      Heart sounds: Normal heart sounds.   Pulmonary:      Effort: Pulmonary effort is normal.      Breath sounds: Normal breath sounds.      Comments: Well-healed right subclavicular generator noted.  Abdominal:      General: Bowel sounds are normal.      Palpations: Abdomen is soft.   Musculoskeletal:      Cervical back: Normal range of motion.   Neurological:      Mental Status: She is alert.              Assessment and Plan:    Problem   Idiopathic Hypersomnia    She has a long history of daytime sleepiness and was previously seen by Dr. Ellwood Handler at [facility] around 2018.  She reports MSLT at that time did not show narcolepsy but was consistent with idiopathic hypersomnia.  She was treated with Nuvigil, Provigil and she believes perhaps even stimulants but had issues with tachycardia, PVCs and other rhythm issues therefore discontinued entirely.     Severe Episode of Recurrent Major Depressive Disorder (Hcc)    11/23/2020   Stable-Chronic  -Current symptoms: SIGECAPS (9/9)   -Denies HI and AVH. Passive SI w/o plan or intent  PHQ-9  PHQ-9 Score: 25 (11/03/2020 11:09 AM)             Plan:  The following medications will be continued for the above symptoms:  -Continue Klonopin 2mg  PO QHS  -Continue Prozac 60mg  PO Daily  -Continue Spravato 84mg  IN Weekly     Osa (Obstructive  Sleep Apnea)    07/2016 Moderate-severe, AHI 22.9 Mob1 in REM sleep when OSAS most severe    HGNS implantation: 10/13/2321  HGNS activation: 11/27/2021    HSAT 06/27/2022: AHI 53/hr (3%) with no hypoxemia.    08/20/22  We noticed bulk movement is significant and seen much sooner than what we would call FT.  Therefore, we based FT assessment on BM and Teeth:       ( + - + ) BM - 1.4v, Teeth 1.9V.       ( 0 - 0 ) BM - 0.7v, teeth 1.3v.      Her incoming setting was 1.3V on (0 - 0) and complained of pain with stimulation at jaw line.  Patient tolerated default better so we switched back and started her at 1.6v with range of 1.4 - 2.4.      11/19/2022: 2.2 V (1.4-2.4 V), on default settings    01/07/2023:  Functional threshold 1.4 V   Sense waveform checked- Good tongue protrusion.      Settings after today's visit  Amplitude range: 1.4v-2.4v  Final Amplitude: 1.9v  Pause time: 15 minutes  Delay time: 30 minutes  Stop time: 10 hours   Default settings (+ - +)       Dyspnea on exertion and cough (Resolved)    My suspicion that asthma was the etiology for Mrs. Dressel's new onset dyspnea on exertion & cough since April 2021 was low given that she has no h/o asthma, breathing symptoms, or requiring albuterol previously in her lifetime, and in-office spirometry was normal with no further significant reversibility post-bronchodilator, and pulmonary examination has been normal with no change post-bronchodilator administration.  Mrs. Sandora did not feel any improvement in symptoms with albuterol 2p administered in clinic at a previous in-person visit.  That being said, because she's had a normal cardiac evaluation with Holter monitor & stress echo per her cardiologist, normal CXR per her PCP, and asthma was considered on the differential diagnosis, we tried asthma inhaler with Flovent 2p BID x3 weeks and that was been helpful to relieve the dyspnea on exertion.  However, unfortunately, since discontinuing Singulair due to concerns for side effects to this drug, she's developed a non-productive cough.  Ddx includes but isn't limited to poor cardiac conditioning, asthma, upper airway cough syndrome, GERD, and many others.  She does think that Breo 100/93mcg 1p daily was helpful for symptoms.  Complete PFT 01/2020 was normal.  Her PCP completed CXR and CT chest that was unremarkable.      Asthma is on the differential considerations for this patient's symptoms. Given that some of their symptoms are atypical for asthma, further evaluation for possible asthma component to their symptoms with broncho-provocation testing is reasonable. We discussed the method of methacholine challenge today & the potential risks and benefits and they reported understanding. She is no longer having symptoms and is off inhalers at this time.    The patient has had no heart attack or stroke in the past 3 months, uncontrolled hypertension, aortic aneurysm, recent eye surgery or increased ocular or intracranial pressure. FEV1 is greater than 75%-predicted.  We discussed methods, merits, potential risks; we discussed going forward with this test versus leaving it on the back burner now that symptoms have resolved, and she wishes to proceed with methacholine challenge.       Intolerance of Continuous Positive Airway Pressure (Cpap) Ventilation (Resolved)       Severe episode of recurrent major depressive disorder (  HCC)  She has been on Prozac for several years but continues to feel depressed and socially withdrawn.  She apparently has discussed switching to Effexor in the near future.  I explained the need for holding any SSRI prior to an MSLT so perhaps if she does make this transition we could schedule the study at that time while off Prozac for 2 to 3 weeks.    OSA (obstructive sleep apnea)  She continues to the use inspire most nights although she will forget to turn it on about 1 night a week.  Download showed she is using for 8 hours 2 minutes nightly at 2.0 V.  Most recent testing showed this should be effective so we will continue this setting unchanged.  She will try to use on a nightly basis more consistently.  We will not make any changes at this time.  Waveform and functional status both appeared intact during the visit with no changes made.    Idiopathic hypersomnia  Unfortunately she developed side effects with Sunosi, tachycardia and some palpitations.  She had similar side effects on methylphenidate 10 mg daily.  However, she has used Adderall and Vyvanse in the past with minimal side effects and appeared to improve her daytime alertness.  I really started her on Vyvanse 10 mg daily for now but we will hold on titrating higher until we decide if we will do an MSLT in the next few weeks.  She will let me know if she has benefit or any side effects.

## 2023-06-17 NOTE — Assessment & Plan Note
 Unfortunately she developed side effects with Sunosi, tachycardia and some palpitations.  She had similar side effects on methylphenidate 10 mg daily.  However, she has used Adderall and Vyvanse in the past with minimal side effects and appeared to improve her daytime alertness.  I really started her on Vyvanse 10 mg daily for now but we will hold on titrating higher until we decide if we will do an MSLT in the next few weeks.  She will let me know if she has benefit or any side effects.

## 2023-06-17 NOTE — Assessment & Plan Note
 She continues to the use inspire most nights although she will forget to turn it on about 1 night a week.  Download showed she is using for 8 hours 2 minutes nightly at 2.0 V.  Most recent testing showed this should be effective so we will continue this setting unchanged.  She will try to use on a nightly basis more consistently.  We will not make any changes at this time.  Waveform and functional status both appeared intact during the visit with no changes made.

## 2023-06-17 NOTE — Assessment & Plan Note
 She has been on Prozac for several years but continues to feel depressed and socially withdrawn.  She apparently has discussed switching to Effexor in the near future.  I explained the need for holding any SSRI prior to an MSLT so perhaps if she does make this transition we could schedule the study at that time while off Prozac for 2 to 3 weeks.

## 2023-07-05 ENCOUNTER — Encounter: Admit: 2023-07-05 | Discharge: 2023-07-05 | Payer: MEDICARE

## 2023-07-05 MED ORDER — LEVOTHYROXINE 88 MCG PO TAB
88 ug | ORAL_TABLET | Freq: Every day | ORAL | 1 refills | 30.00000 days | Status: AC
Start: 2023-07-05 — End: ?

## 2023-07-09 ENCOUNTER — Encounter: Admit: 2023-07-09 | Discharge: 2023-07-09 | Payer: MEDICARE

## 2023-07-11 ENCOUNTER — Encounter: Admit: 2023-07-11 | Discharge: 2023-07-11 | Payer: MEDICARE

## 2023-07-12 ENCOUNTER — Encounter: Admit: 2023-07-12 | Discharge: 2023-07-12 | Payer: MEDICARE

## 2023-07-12 MED FILL — REPATHA SURECLICK 140 MG/ML SC PNIJ: 140 mg/mL | SUBCUTANEOUS | 28 days supply | Qty: 2 | Fill #4 | Status: AC

## 2023-07-13 ENCOUNTER — Encounter: Admit: 2023-07-13 | Discharge: 2023-07-13 | Payer: MEDICARE

## 2023-07-13 ENCOUNTER — Ambulatory Visit: Admit: 2023-07-13 | Discharge: 2023-07-14 | Payer: MEDICARE

## 2023-08-06 ENCOUNTER — Encounter: Admit: 2023-08-06 | Discharge: 2023-08-06

## 2023-08-07 ENCOUNTER — Encounter: Admit: 2023-08-07 | Discharge: 2023-08-07

## 2023-08-08 ENCOUNTER — Encounter: Admit: 2023-08-08 | Discharge: 2023-08-08

## 2023-08-08 MED FILL — REPATHA SURECLICK 140 MG/ML SC PNIJ: 140 mg/mL | SUBCUTANEOUS | 28 days supply | Qty: 2 | Fill #5 | Status: AC

## 2023-08-17 ENCOUNTER — Encounter: Admit: 2023-08-17 | Discharge: 2023-08-17

## 2023-08-22 ENCOUNTER — Encounter: Admit: 2023-08-22 | Discharge: 2023-08-22 | Payer: MEDICARE

## 2023-08-25 ENCOUNTER — Encounter: Admit: 2023-08-25 | Discharge: 2023-08-25 | Payer: MEDICARE

## 2023-08-25 DIAGNOSIS — E7800 Pure hypercholesterolemia, unspecified: Secondary | ICD-10-CM

## 2023-08-25 DIAGNOSIS — E559 Vitamin D deficiency, unspecified: Secondary | ICD-10-CM

## 2023-08-29 ENCOUNTER — Encounter: Admit: 2023-08-29 | Discharge: 2023-08-29 | Payer: MEDICARE

## 2023-08-29 ENCOUNTER — Ambulatory Visit: Admit: 2023-08-29 | Discharge: 2023-08-29 | Payer: MEDICARE

## 2023-08-29 ENCOUNTER — Ambulatory Visit: Admit: 2023-08-29 | Discharge: 2023-08-30 | Payer: MEDICARE

## 2023-08-29 DIAGNOSIS — G4733 Obstructive sleep apnea (adult) (pediatric): Secondary | ICD-10-CM

## 2023-08-29 NOTE — Progress Notes
 Visit created in Error. No billable services.

## 2023-08-30 ENCOUNTER — Ambulatory Visit: Admit: 2023-08-30 | Discharge: 2023-08-30 | Payer: MEDICARE

## 2023-08-31 ENCOUNTER — Encounter: Admit: 2023-08-31 | Discharge: 2023-08-31 | Payer: MEDICARE

## 2023-08-31 ENCOUNTER — Ambulatory Visit: Admit: 2023-08-31 | Discharge: 2023-09-01 | Payer: MEDICARE

## 2023-09-01 ENCOUNTER — Encounter: Admit: 2023-09-01 | Discharge: 2023-09-01 | Payer: MEDICARE

## 2023-09-05 ENCOUNTER — Encounter: Admit: 2023-09-05 | Discharge: 2023-09-05 | Payer: MEDICARE

## 2023-09-06 ENCOUNTER — Encounter: Admit: 2023-09-06 | Discharge: 2023-09-06 | Payer: MEDICARE

## 2023-09-06 MED FILL — REPATHA SURECLICK 140 MG/ML SC PNIJ: 140 mg/mL | SUBCUTANEOUS | 84 days supply | Qty: 6 | Fill #1 | Status: AC

## 2023-09-07 ENCOUNTER — Encounter: Admit: 2023-09-07 | Discharge: 2023-09-07 | Payer: MEDICARE

## 2023-09-07 ENCOUNTER — Ambulatory Visit: Admit: 2023-09-07 | Discharge: 2023-09-08 | Payer: MEDICARE

## 2023-09-07 DIAGNOSIS — Z136 Encounter for screening for cardiovascular disorders: Secondary | ICD-10-CM

## 2023-09-07 MED ORDER — FLECAINIDE 100 MG PO TAB
50 mg | ORAL_TABLET | Freq: Two times a day (BID) | ORAL | 3 refills | 30.00000 days | Status: AC
Start: 2023-09-07 — End: ?

## 2023-09-07 MED ORDER — METOPROLOL SUCCINATE 25 MG PO TB24
ORAL_TABLET | ORAL | 11 refills | 90.00000 days | Status: AC
Start: 2023-09-07 — End: ?

## 2023-09-07 NOTE — Patient Instructions
 Follow-Up:    -Thank you for allowing me to take care of you today. My name is Judyann Casasola, Charity fundraiser.    -We would like you to follow up in   2 years with Any [facility] Provider (NP or PA)   The schedule is released approximately 5-6 months in advance. You should be called or mailed to make an appointment, however if you would like to call us  to make this appt, please call [phone].    -You will receive a survey in the upcoming week from [facility]. Your feedback is important to us , and helps us  continue to improve patient care and patient satisfaction.     Changes From Today's Office Visit     OK for surgery tomorrow    Return in 2 years   Same meds    Metoprolol succinate 12-5-25mg /d may help with increased HR on anti sleepiness drugs    Could aggravate depression (???)  OR   Ivabradine 2.5 twice/d    Try Metoprolol in June    Contacting our office:    -Business Hours: Monday-Friday, 8:00 am-4:30 pm (excluding Holidays).     -For medical questions or concerns, please send us  a message through your MyChart account or call the [facility] nursing triage line at [phone]. Please leave a detailed message with your name, date of birth, and reason for your call.  If your message is received before 3:30pm, every effort will be made to call you back the same day.  Please allow time for us  to review your chart prior to call back.     -For medication refills please start by contacting your pharmacy. You can also send us  a prescription question through your MyChart or call the nurse triage line above.     -Our fax number is [phone].    Results & Testing Follow Up:    -Please allow 10-15 business days for the results of any testing to be reviewed. Please call our office if you have not heard from a nurse within this time frame.    -Should you choose to complete testing at an outside facility, please contact our office after completion of testing so that we can ensure that we have received results.    Lab and test results:  As a part of the CARES act, starting 08/09/2019, some results will be released to you via mychart immediately and automatically.  You may see results before your provider sees them; however, your provider will review all these results and then they, or one of their team, will notify you of result information and recommendations.   Critical results will be addressed immediately, but otherwise, please allow us  time to get back with you prior to you reaching out to us  for questions.  This will usually take about 72 hours for labs and 5-7 days for procedure test results.      We know you have a choice and want to thank you for choosing [facility].

## 2023-09-07 NOTE — Progress Notes
 Date of Service: 09/07/2023    Holly Maldonado is a 63 y.o. female.       HPI     Holly Maldonado was in the office today for reevaluation.  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  She has had excellent suppression with flecainide and has been asymptomatic.  She is on 50 Mg twice daily    She has ADHD and when she takes drugs for ADHD it tends to make her PVCs worse    There is a history of sleep apnea.  She got an inspire device in 2023 which apparently has treated her sleep apnea but she still has daytime hyper somnolence.  In fact, in September 2024 she fell [place] [address].  She was lucky that there were no serious injuries.    She sees Holly Maldonado for an elevated LP(a) (117 with upper limits of normal being 29).  She is on Repatha, ezetimibe and rosuvastatin 10 Mg daily.  Last week her cholesterol was 110, triglycerides 121, HDL 69 and LDL 29.  This is her most favorable lipid profile in recent years (and probably ever).    In 2021 her coronary calcium score was 0.  She also had a negative stress echo at that time.  She has not had a repeat stress test.    She denies angina.  Her paternal grandfather died on the table getting coronary bypass surgery in his late 10s.  Her mom died of an MI at age 62  She denies hypertension and diabetes.  She is a never smoker.    She only has palpitation when she takes ADHD medicine and at that point her complaint is that her heart beats a little faster.  She has hypersomnolence but I do not think she has syncope.  She denies PND, orthopnea and edema.  She denies dyspnea on exertion but is not all active    She is having a right rotator cuff tomorrow up in [place] where her son is an Investment banker, operational.  (One of his partners is doing the surgery).  She will be staying with her nurse daughter in [place] for at least the first week postop    She does have her own apartment now in [place]    She has been on total disability since fall 2021 due to severe recurrent depression and bulimia.  She says that bulimia is not active.  She says the depression is okay but definitely not great.    She has lost 3 pounds in the last 2 years and is down 29 pounds from her peak a few years ago.    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  She was on chlorthalidone for that in the past but not currently  In 2023 she had a 4 cm left adrenal mass removed laparoscopically at [place].  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 Knute Perla at [facility] around 2018.  She reports MSLT at that time did not show narcolepsy but was consistent with idiopathic hypersomnia.  She was treated with Nuvigil, Provigil and she believes perhaps even stimulants but had issues with tachycardia, PVCs and other rhythm issues therefore discontinued entirely.      Breast pain 02/11/2022     DIAGNOSIS:  Bilateral breast pain      HISTORY:  Holly Maldonado is a female who presented to the [facility] on 02/11/2022 at age 72 for bilateral breast pain.  She reports intermittent breast pain for about 2 months, specifically nipple burning.  She reports that she had been using an Estrogen patch and the dose was increased in early 2023.  She felt that this was contributing to some pain at the time and she decided to discontinue in spring 2023.  She has recently been monitored closely for thyroid and parathyroid issues, she had and adrenal gland removed in 07/2021.  She had screening mammogram in 07/2021 in [place] which was benign.  She denies having prior breast issues or procedures.    BREAST IMAGING:  Mammogram:    - Screening mammogram 07/21/2021 Holly Maldonado) revealed scattered fibroglandular densities.  No concerning masses or calcifications were seen.       REPRODUCTIVE HEALTH:  Age at first Menarche:  39  Age at First Live Birth:  3  Age at Menopause:  35 (HRT x 5 years)  Gravida:  6  Para: 5  Breastfeeding:  yes    PROCEDURE:   PERTINENT PMH:  Chronic Bulimia, hyperparathyroidism, CAD, GERD, Sleep apnea, h/p pituitary adenoma  FAMILY HISTORY:  Maternal Aunt- Breast cancer.  No family history of ovarian or prostate cancer.   PHYSICAL EXAM on PRESENTATION:    MEDICAL ONCOLOGY:      REFERRED BY:   Holly Maldonado      Hyperparathyroidism 02/02/2022    Anosmia 10/22/2021     After COVID 19 infection in 2020.      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    Tinnitus, bilateral 11/11/2020    Agatston CAC score, ZERO (4/21) 09/12/2020    Menopause syndrome 04/30/2020    Genitourinary syndrome of menopause 04/30/2020    Elevated liver enzymes 04/30/2020    Arthralgia of right temporomandibular joint 03/17/2020    Chronic sore throat 03/17/2020    Chronic post-traumatic stress disorder (PTSD) 03/03/2020     10/06/2020   Stable-Chronic  -Trauma: Childhood trauma  -Current Symptoms:  -Triggers:  Plan:  The following medications will be continued for the above symptoms:  -Continue Klonopin 2mg  PO QHS  -Continue Prozac 60mg  PO Daily  -Continue Cyproheptadine 4mg  PO QHS  -Recommend CBT-I coach app, CPT coach app, PTSD Coach app, and Dream EZ app  -Recommend visiting [email or web address] and looking into Trauma Focused CBT, CPT, or EMDR  -Recommend Self Authoring program          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 Passive SI w/o plan or intent  PHQ-9  PHQ-9 Score: 25 (11/03/2020  11:09 AM)             Plan:  The following medications will be continued for the above symptoms:  -Continue Klonopin 2mg  PO QHS  -Continue Prozac 60mg  PO Daily  -Continue Spravato 84mg  IN Weekly      Odynophagia 01/30/2020     No signs of thrush or other abnormalities on examination today.  She declines trial of empiric therapy for thrush.  Reviewed proper inhaler technique and rinsing mouth/brushing teeth after use.  She declines to change inhaler to one that could be used with spacer.  Referral to [facility] placed for further evaluation and consideration of laryngoscopy.      Family history of premature coronary artery disease 2019/09/08     Mom-MI/death at 53  MGF-CAB/death at 35      Elevated Lp(a) 03/19/2019    Pure hypercholesterolemia 03/19/2019     2019/09/08 - Calcium Score CT:  The total calcium score is 0. This represents the <10th percentile rank for females age 8-64 years old.       Pituitary adenoma (CMS-HCC) 09/04/2018    Elevated prolactin level 09/04/2018    Myalgia 02/16/2018     We discussed that myalgias are not a typical sign/symptom of IgE mediated aeroallergens sensitization.  I would not expect myalgias to be a result of her IgE positive antibodies to dust mite or allergic rhinitis.  I recommend she follow-up with her primary care provider for further evaluation and management of myalgias.      Headache 02/16/2018     12-12-17 report of outside CT sinuses without contrast impression: Mild chronic mucosal thickening within anterior ethmoid air cells, otherwise sinuses normal    We discussed that chronic daily headache that radiates into her neck and shoulders is not a typical sign/symptom of IgE mediated aeroallergens sensitization, or allergic rhinitis or rhinoconjunctivitis.  If she had not already had a CT sinuses, I might of been recommending that she get a CT sinuses and see an ear nose and throat specialist for evaluation of possible chronic sinusitis.  She is already done that, and it is her impression that the ENT physician she saw was not very impressed with her CT sinus results and did not feel that she had chronic sinusitis as an explanation for her chronic headache.    Other differential diagnosis considerations for her chronic daily headache include migraine or chronic tension headache or other neurologic headache disorder, related to her history of mood disorders, or probably many other potential etiologies.  Ultimately I am not the best expert in evaluation of headaches.      I recommend she follow-up with her PCP for further evaluation and/or management of this chronic daily headache she has been having.  Will defer imaging and/or neurology referral to her PCPs discretion at this time.      Allergic rhinoconjunctivitis 02/16/2018     12-12-17 total IgE within normal limits   IgE class II or greater to: 2 dust mite species  05-25-18 aeroallergen SPT positive to 2 cockroach species, 2 dust mite species, and 2 molds with appropriate controls  05-25-18 aeroallergen ID positive to 2 trees, 2 grasses, 1 weed, cat, dog, 1 mold with appropriate controls    Continue aeroallergen avoidance measures.  Continue saline sinus rinses daily PRN using distilled water to mix with saline packets.  She may use Patanase to 1-2p en BID PRN and Zaditor to 1 dr ea eye BID PRN.  She does not have to use  daily if not having daily symptoms.  She may change Zyrtec to 10mg  daily PRN.  Encouraged to take on shot days as may help with prevention of large local reactions at injection sites.  At this time, she'd like to continue allergy shots.    Potential adverse effects of medications were discussed and all questions were answered to the best of my ability previously.      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  The rhythm is sinus at 47-117 bpm with mean of 72 bpm.  There are 11 isolated atrial premature beats.  There are no couplets.  There is no SVT.  There is no A. fib or a flutter.  3.9% of all beats are isolated PVCs.  This includes 0.5% bigeminy.  AV conduction is normal.  There are no pauses.  There are multiple diary entries.  The initial diary entry is for heart flutter.  This is sinus rhythm at 67 bpm with no PVCs.  Once in a while which she complains of fluttering she has PVCs but that is the exception not to rule.  Typically when she complains of fluttering she is normal sinus rhythm with no arrhythmia of any kind.  This study is remarkable for almost 4% isolated uniform PVCs.  The patient has multiple complaints of heart fluttering but these do not correlate in any significant way with her PVCs.  These complaints occur when she is sinus rhythm with no arrhythmia.  08/20/2019 - Holter:  A Holter monitor provides 2 days 41 minutes of data.  The rhythm is sinus at 56-139 bpm with a mean of 86 bpm.  There are 2 isolated PACs.  There is 1 couplet.  There are 3 runs of atrial tach, 2 triplets and a 5 beat run.  The 5 beat run is at approximately 136 bpm.  There is no A. fib or a flutter.  24.5% of all beats are PVCs.  These are essentially all isolated PVCs.  40% of these PVCs are bigeminy and about 15% are trigeminy.  There are 13 couplets.  There is no VT.  PVCs are all 1 morphology.  There is no significant diurnal variation in PVC frequency.  AV conduction is normal.  There are no pauses.  There are 7 patient events.  Each is for heart fluttering/racing/pounding.  6 of the 7 show PVCs, not uncommonly bigeminy.  In each case the underlying rhythm is sinus.  The rates are: 94, 95, 95, 104,  112, 112 and 121 bpm.  09/14/2019 - EXED:  Low risk Duke Treadmill Score.  Negative exercise stress echo without evidence of inducible ischemia.  Low risk for cardiovascular complications.   11/06/2019 - Holter:  A Holter monitor provides 1 day 23 hours 17 minutes of data.  The rhythm is sinus at 64-120 bpm  with mean of 78 bpm.  There are 2 isolated PACs.  There are no couplets.  There is no SVT.  There is no A. fib or a flutter.  There are 43 isolated PVCs with no couplets and no VT.  AV conduction is normal.  There are no pauses.  There are no patient events.  Essentially normal study.  03/11/2020 - Holter:  A Holter monitor provides 2 days 13 minutes of data.  Over 188,000 beats are analyzed.  The rhythm is sinus at 51-130 bpm with a mean of 66 bpm.  There are 8 isolated PACs.  There are no couplets.  There is no SVT.  There is no A. fib or a flutter.  There are just over 6500 isolated PVCs which is 3.46% of all beats.  There are 60 bigeminal beats and 342 trigeminal beats.  There are no couplets.  There is no VT.  It looks like there is 1 PVC morphology.  AV conduction is normal.    There are no pauses.  There is a single patient event.  No specific symptom is listed.  This is sinus tachycardia at about 115 bpm.       Atrial tachycardia 10/26/2013     2014 Bruce TM: 1-2' post exercise- a few PVCs, several 5-10 beat runs of AT at ~120bpm-as of 2015 she does not remember if she had any palpitation w/ the TM      Maxillary sinus fracture (CMS-HCC) 08/22/2012    Medullary sponge kidney 08/22/2012     2010 - HCTZ started by [facility].  Presented as hematuria.  CT-abdomen confirmed      Hypothyroid     Dyslipidemia     OSA (obstructive sleep apnea)      07/2016 Moderate-severe, AHI 22.9 Mob1 in REM sleep when OSAS most severe    HGNS implantation: 10/13/2321  HGNS activation: 11/27/2021    HSAT 06/27/2022: AHI 53/hr (3%) with no hypoxemia.    08/20/22  We noticed bulk movement is significant and seen much sooner than what we would call FT.  Therefore, we based FT assessment on BM and Teeth:       ( + - + ) BM - 1.4v, Teeth 1.9V.       ( 0 - 0 ) BM - 0.7v, teeth 1.3v.      Her incoming setting was 1.3V on (0 - 0) and complained of pain with stimulation at jaw line.  Patient tolerated default better so we switched back and started her at 1.6v with range of 1.4 - 2.4.      11/19/2022: 2.2 V (1.4-2.4 V), on default settings    01/07/2023:  Functional threshold 1.4 V   Sense waveform checked- Good tongue protrusion.      Settings after today's visit  Amplitude range: 1.4v-2.4v  Final Amplitude: 1.9v  Pause time: 15 minutes  Delay time: 30 minutes  Stop time: 10 hours   Default settings (+ - +)        Palpitations      03/19/10 Holter monitor:  Episodes of ventricular bigeminy associated with palpitations    multiple PVCs from EKG 08/14/12           Review of Systems   Constitutional: Negative.   HENT: Negative.     Eyes: Negative.    Cardiovascular: Negative.    Respiratory: Negative.     Endocrine: Negative.    Hematologic/Lymphatic: Negative.    Skin: Negative.    Musculoskeletal: Negative.  Gastrointestinal: Negative.    Genitourinary: Negative.    Neurological: Negative.    Psychiatric/Behavioral: Negative.     Allergic/Immunologic: Negative.        Physical Exam  General Appearance: no acute distress  Skin: warm, moist, no ulcers  HEENT: PERL  Neck Veins: neck veins are flat, neck veins are not distended  Carotid Arteries: normal carotid upstroke bilaterally, no bruits  Chest Inspection: chest is normal in appearance  Auscultation/Percussion: lungs clear to auscultation, no rales, rhonchi, or wheezing  Cardiac Auscultation: Normal S1 & S2, no S3 or S4, no rub  Murmurs: no cardiac murmur  Extremities: no lower extremity edema; 2+ symmetric distal pulses  Abdominal Exam: soft, non-tender, no masses, bowel sounds normal  Liver & Spleen: no organomegaly  Neurologic Exam: normal station and gait            Cardiovascular Health Factors  Vitals BP Readings from Last 3 Encounters:   09/07/23 120/78   08/29/23 117/79   06/17/23 103/70     Wt Readings from Last 3 Encounters:   09/07/23 84.4 kg (186 lb)   08/29/23 81.6 kg (180 lb)   06/17/23 81.6 kg (180 lb)     BMI Readings from Last 3 Encounters:   09/07/23 31.93 kg/m?   08/29/23 30.90 kg/m?   06/17/23 30.90 kg/m?      Smoking Social History     Tobacco Use   Smoking Status Former    Current packs/day: 0.00    Average packs/day: 1 pack/day for 7.0 years (7.0 ttl pk-yrs)    Types: Cigarettes    Start date: 08/22/1985    Quit date: 08/22/1992    Years since quitting: 31.0   Smokeless Tobacco Never      Lipid Profile Cholesterol   Date Value Ref Range Status   08/29/2023 110 <200 mg/dL Final     HDL   Date Value Ref Range Status   08/29/2023 69 >40 mg/dL Final     LDL   Date Value Ref Range Status   08/29/2023 29 <100 mg/dL Final     Triglycerides   Date Value Ref Range Status   08/29/2023 121 <150 mg/dL Final      Blood Sugar Hemoglobin A1C   Date Value Ref Range Status   03/17/2022 5.5 4.0 - 5.7 % Final     Comment:     The ADA recommends that most patients with type 1 and type 2 diabetes maintain   an A1c level <7%.       Glucose   Date Value Ref Range Status   08/29/2023 97 70 - 100 mg/dL Final   16/02/9603 89 70 - 100 mg/dL Final   54/01/8118 88 70 - 100 mg/dL Final          Problems Addressed Today  Encounter Diagnoses   Name Primary?    Screening for heart disease Yes       Assessment and Plan     Today's EKG is sinus and within normal limits.    She is okay to proceed with her right rotator cuff repair tomorrow.  She has a copy of her EKG to take with her.    She was to try medications for the hypersomnolence which she says is basically the same thing she takes for ADHD and will increase her heart rate.  I do not want to make any changes perioperatively but I told her that she could start Toprol-XL 12.5 or 25 mg daily to offset the  effects of medication for her hypersomnolence.  If it is helpful but not fully effective she should let us  know and we can increase the dose.  I am not planning to increase her flecainide    I have recommended 2-year follow-up.  I am not going to preorder any studies.  She may need a Holter.  It might be reasonable to do a multiday Holter with the first 2 or 3 days on flecainide and then of 4-8 days off flecainide.  It may be appropriate to update a stress test given her LV delay and her family history.    We may end up with a monitor over the summer if she is having palpitations on drugs for hypersomnolence.           Current Medications (including today's revisions)   aspirin EC (ASPIR-LOW) 81 mg tablet Take one tablet by mouth daily.    docosahexaenoic acid/epa (FISH OIL PO) Take 3 capsules by mouth daily.    evolocumab (REPATHA SURECLICK) 140 mg/mL injectable PEN Inject 1 mL under the skin every 14 days.    ezetimibe (ZETIA) 10 mg tablet Take one tablet by mouth daily.    famotidine (PEPCID) 40 mg tablet Take one tablet by mouth twice daily.    flecainide (TAMBOCOR) 100 mg tablet Take one-half tablet by mouth twice daily.    FLUoxetine (PROZAC) 20 mg capsule Take one capsule by mouth daily. Take one capsule (20mg ) by mouth daily with additional two 40mg  capsules (80mg ) for total daily dose of 100mg .    FLUoxetine (PROZAC) 40 mg capsule TAKE TWO CAPSULES (80MG ) BY MOUTH DAILY WITH ONE 20MG  CAPSULE FOR TOTAL DAILY DOSE OF 100MG .    lamoTRIgine (LAMICTAL) 100 mg tablet Take two tablets by mouth daily.    levothyroxine (SYNTHROID) 88 mcg tablet TAKE 1 TABLET BY MOUTH EVERY DAY    magnesium oxide (MAG-OX) 400 mg (241.3 mg magnesium) tablet Take three tablets by mouth at bedtime daily. 1,200 mg = 3 tablets    metoprolol succinate XL (TOPROL XL) 25 mg extended release tablet 1/2-1 tab daily to slow heart rate. Begin 10/09/23    other medication CITRUS BERGAMOT supplement. 1 tablet in the morning  Indications: for cholesterol    rosuvastatin (CRESTOR) 10 mg tablet TAKE 1 TABLET BY MOUTH EVERY DAY

## 2023-09-13 ENCOUNTER — Encounter: Admit: 2023-09-13 | Discharge: 2023-09-13 | Payer: MEDICARE

## 2023-09-13 ENCOUNTER — Ambulatory Visit: Admit: 2023-09-13 | Discharge: 2023-09-14 | Payer: MEDICARE

## 2023-09-13 DIAGNOSIS — E213 Hyperparathyroidism, unspecified: Secondary | ICD-10-CM

## 2023-09-13 DIAGNOSIS — E039 Hypothyroidism, unspecified: Secondary | ICD-10-CM

## 2023-09-13 DIAGNOSIS — D352 Benign neoplasm of pituitary gland: Secondary | ICD-10-CM

## 2023-09-13 NOTE — Progress Notes
 Chief Complaint   Patient presents with    Fatigue    Office Visit Follow Up     Hyperparathyroidism   Pituitary microadenoma      Date of Service: 09/13/2023    Holly Maldonado is a 63 y.o. female. DOB: 10-02-1960   MRN#: [id number]    HPI:  Hypothyroidism: She is on levothyroxine 88 mcg daily.  Dose was reduced earlier 2022.  She is compliant with medicine and takes it on an empty stomach in morning.     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 He has been followed for the prolactin. She appears to have had regression of pituitary adenoma in imagine 11/2017.  No adenoma was seen in 2020 on MRI.  Last prolactin level was normal.  She denies breast complaints, discharge.    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    Patient has a history of depression and has been  Lithium, Prozac, Lamictal, Cymbalta in the past.    High PTH: She has past history of nephrolithiasis and she has medullary sponge kidney disease.  Her first kidney stone was 22 years back.  She had ultrasound in 2022 that showed kidney stones.  She was started on chlorthalidone by nephrologist in 2022 as her 24-hour urine calcium was high.  Of note had 24-hour urine calcium in November 2022 was 375 mg per 24 hours which had improved to 219mg /24 hours.   PTH was checked 2023 and it was found to be elevated.  We stopped chlorthalidone 2023 fall.    Patient was discussed at parathyroid tumor board and it was felt that hypercalciuria and kidney stones is because of other abnormalities.  Parathyroid surgery may not necessarily correct those abnormalities and prevent kidney stones.  Neck ultrasound showed possible parathyroid enlargement, no obvious adenoma.  Most recent PTH is normal.    She has maybe 1 serving of calcium rich food on a daily basis.  Otherwise diet is low in calcium.  She takes a MV.    Hypothyroidism: On levothyroxine 88 mcg daily.    She has salt craving and excess sweating at night.  Veozah was not approved.    Review of Systems   Constitutional:  Negative for fever.   Cardiovascular:  Negative for chest pain.         Objective:      aspirin EC (ASPIR-LOW) 81 mg tablet Take one tablet by mouth daily.    docosahexaenoic acid/epa (FISH OIL PO) Take 3 capsules by mouth daily.    evolocumab (REPATHA SURECLICK) 140 mg/mL injectable PEN Inject 1 mL under the skin every 14 days.    ezetimibe (ZETIA) 10 mg tablet Take one tablet by mouth daily.    famotidine (PEPCID) 40 mg tablet Take one tablet by mouth twice daily.    flecainide (TAMBOCOR) 100 mg tablet Take one-half tablet by mouth twice daily.    FLUoxetine (PROZAC) 20 mg capsule Take one capsule by mouth daily. Take one capsule (20mg ) by mouth daily with additional two 40mg  capsules (80mg ) for total daily dose of 100mg .    FLUoxetine (PROZAC) 40 mg capsule TAKE TWO CAPSULES (80MG ) BY MOUTH DAILY WITH ONE 20MG  CAPSULE FOR TOTAL DAILY DOSE OF 100MG .    lamoTRIgine (LAMICTAL) 100 mg tablet Take two tablets by  mouth daily.    levothyroxine (SYNTHROID) 88 mcg tablet TAKE 1 TABLET BY MOUTH EVERY DAY    magnesium oxide (MAG-OX) 400 mg (241.3 mg magnesium) tablet Take three tablets by mouth at bedtime daily. 1,200 mg = 3 tablets    metoprolol succinate XL (TOPROL XL) 25 mg extended release tablet 1/2-1 tab daily to slow heart rate. Begin 10/09/23    other medication CITRUS BERGAMOT supplement. 1 tablet in the morning  Indications: for cholesterol    rosuvastatin (CRESTOR) 10 mg tablet TAKE 1 TABLET BY MOUTH EVERY DAY     There were no vitals filed for this visit.    There is no height or weight on file to calculate BMI.     Physical Exam  Vitals and nursing note reviewed.   Constitutional:       General: She is not in acute distress.     Appearance: Normal appearance. She is not ill-appearing, toxic-appearing or diaphoretic.   HENT:      Head: Normocephalic and atraumatic.      Nose: Nose normal.   Eyes:      Conjunctiva/sclera: Conjunctivae normal.   Neurological:      Mental Status: She is alert and oriented to person, place, and time.   Psychiatric:         Mood and Affect: Mood normal.               Lab   Thyroid Studies    Lab Results   Component Value Date/Time    TSH 0.50 08/29/2023 02:37 PM    No results found for: Thomasene Flemings, [facility]     11/2018 MRI  There is homogeneous enhancement of the pituitary gland, which   demonstrates a somewhat flattened configuration along the sellar floor   without sellar expansion. The posterior pituitary bright spot is normal in   appearance and location. No discrete sellar or suprasellar mass is   identified. The infundibulum,  optic chiasm, and cavernous sinuses   demonstrate normal signal and morphology.     The ventricles and subarachnoid spaces are normal in size and   configuration. There are a few scattered foci of supratentorial white   matter FLAIR hyperintensity, in a bifrontal and subcortical predominant   distribution. There is no midline shift or mass effect. There is no area   of abnormal contrast enhancement. The vascular flow-voids are   unremarkable. Diffusion weighted imaging is not indicative of acute or   recent infarct.       Outside labs:  02/13/2017  TSH 0.37 (reference 0.40-4.5), free T4 1.1 (reference 0.8- 1.6)  Normal CMP, normal CBC  04/20/2018  Cholesterol 276, LDL 195, HDL 79  TSH 12.12       05/23/2015 MRI brain there is relatively unchanged appearance of 6 mm area of non-enhancement noted within posterior aspect of the right lateral aspect of the pituitary gland concerning for underlying pituitary microadenoma  06/22/2017 bone alkaline phosphatase normal  10/10/2017 ACTH at 9:48 AM 16.66 (reference 7.2-63.3)  10/17/2017 prolactin 42.79 (reference 4.79-23.3) normal at 99.17, FSH and LH both normal 72.36 and 31.69 respectively  11/2017 MRI brain 0.5 x 0.4 x 0.7 cm focal area of nodular enhancement along the left temporal convexity extra-axial space 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  Minimal partial empty sella without convincing evidence of pituitary microadenoma    24-hour urine calcium  November 2022: 375  September 2023 219      Assessment and Plan:    Betzayda A. Alyne Babinski  Burdette Carolin was seen today for fatigue and office visit follow up.    Diagnoses and all orders for this visit:    Pituitary adenoma (CMS-HCC)    Acquired hypothyroidism    Hyperparathyroidism      Assessment  Hx Pititary microadenoma / Partial Empty Sella  05/23/2015 MRI brain there is relatively unchanged appearance of 6 mm area of non-enhancement noted within posterior aspect of the right lateral aspect of the pituitary gland concerning for underlying pituitary microadenoma  10/10/2017 ACTH at 9:48 AM 16.66 (reference 7.2-63.3)  10/17/2017 prolactin 42.79 (reference 4.79-23.3) normal at 99.17, FSH and LH both normal 72.36 and 31.69 respectively  11/2017 MRI brain 0.5 x 0.4 x 0.7 cm focal area of nodular enhancement along the left temporal convexity extra-axial space 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  Minimal partial empty sella without convincing evidence of pituitary microadenoma  2020: No microadenoma was seen.    Prolactinoma  Previous imaging demonstrates microadenoma  10/17/2017 prolactin 42.79 (reference 4.79-23.3)  Repeat prolactin level was normal.    Uncontrolled hypothyroidism  Dx: 1997  On replacement.  TSH is at goal.  Continue levothyroxine 88 mcg daily.    High PTH: She has past history of nephrolithiasis and she has medullary sponge kidney disease.  Her first kidney stone was 22 years back.  She had ultrasound in 2022 that showed kidney stones.    Patient was discussed at parathyroid tumor board and it was felt that hypercalciuria and kidney stones is because of other abnormalities.  Parathyroid surgery may not necessarily correct those abnormalities and prevent kidney stones.  Neck ultrasound showed possible parathyroid enlargement, no obvious adenoma.    Recommended her to repeat 24-hour urine test now.  Will monitor.    Hypothyroidism: TSH ok.  Continue levothyroxine current dose.    Excess sweating: Monitor. Veozah not covered.    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         Electronically signed by Jody Mura, MD 09/13/2023

## 2023-09-13 NOTE — Progress Notes
 Request for Litholink testing submitted:

## 2023-09-28 ENCOUNTER — Encounter: Admit: 2023-09-28 | Discharge: 2023-09-28 | Payer: MEDICARE

## 2023-09-29 ENCOUNTER — Encounter: Admit: 2023-09-29 | Discharge: 2023-09-29 | Payer: MEDICARE

## 2023-10-05 ENCOUNTER — Encounter: Admit: 2023-10-05 | Discharge: 2023-10-05 | Payer: MEDICARE

## 2023-10-10 ENCOUNTER — Encounter: Admit: 2023-10-10 | Discharge: 2023-10-10 | Payer: MEDICARE

## 2023-10-25 IMAGING — MG MM mammogram 3D screen bilat
10 series · 10 of 30 positions shown · non-contrast
Comparison: none

[R CC]
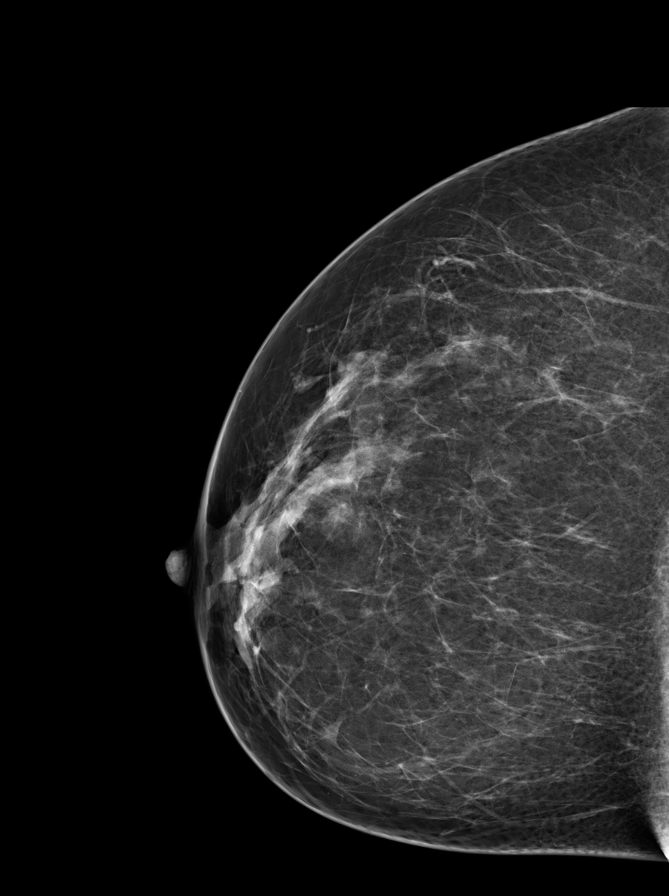

[BTO_TOMO R-CC PRIME, EMPIRE_C.97/112, Sc tomo · tomo slice 34/67.0]
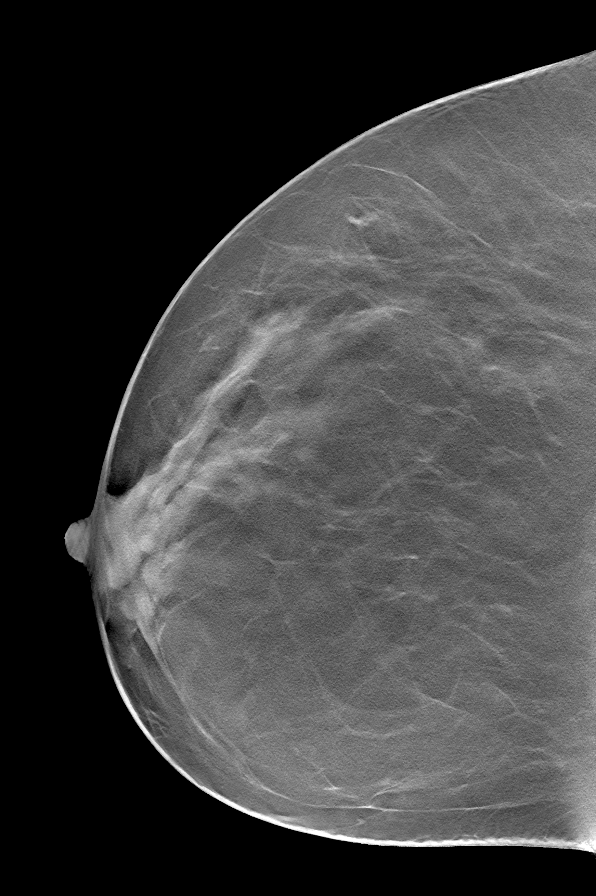

[L CC]
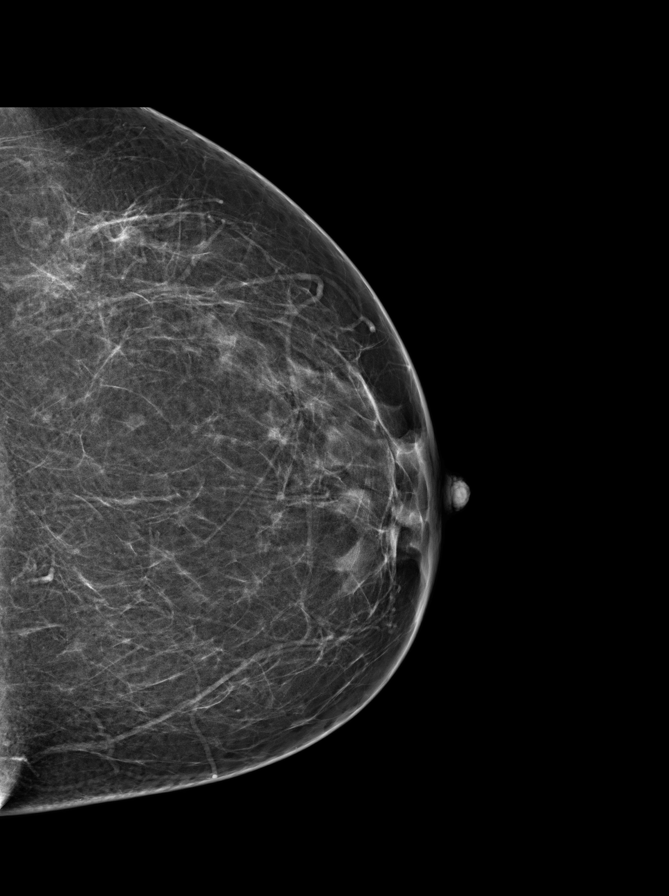

[BTO_TOMO L-CC PRIME, EMPIRE_C.97/112, Sc tomo · tomo slice 35/68.0]
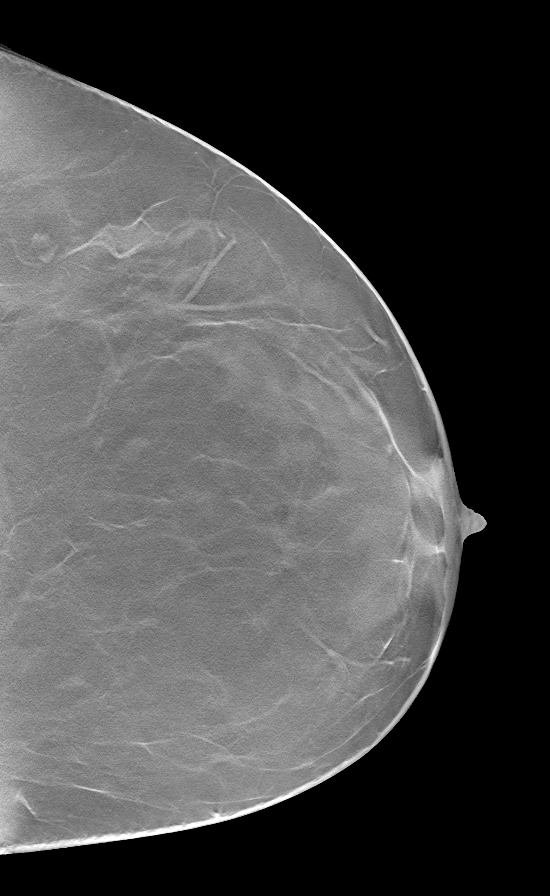

[L MLO]
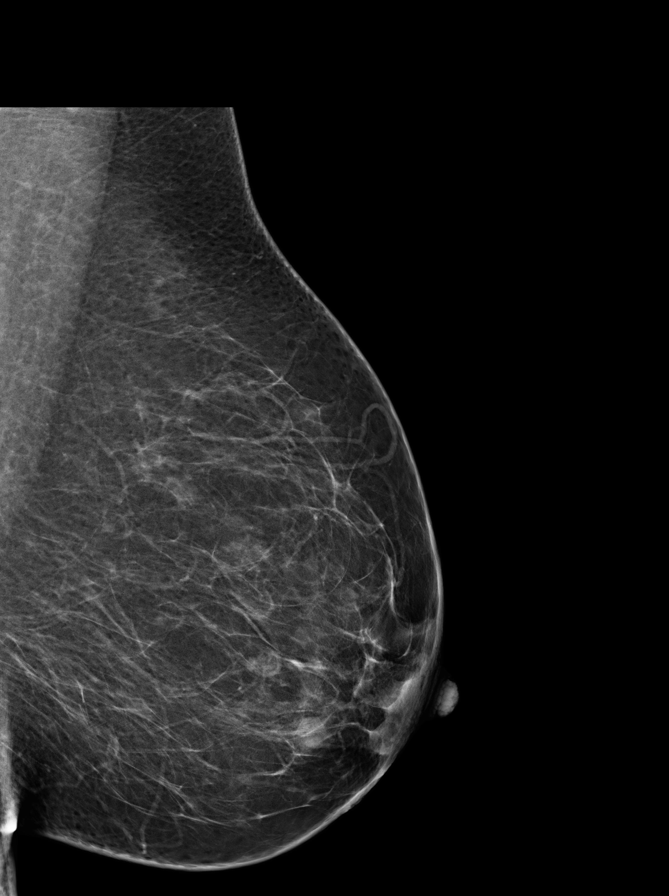

[BTO_TOMO L-MLO PRIME, EMPIRE_C.97/112, S tomo · tomo slice 41/82.0]
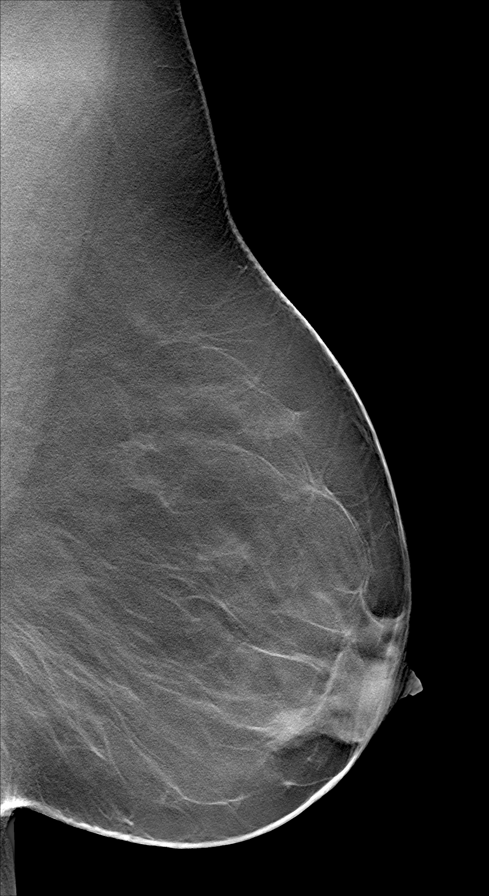

[R MLO (1 of 2)]
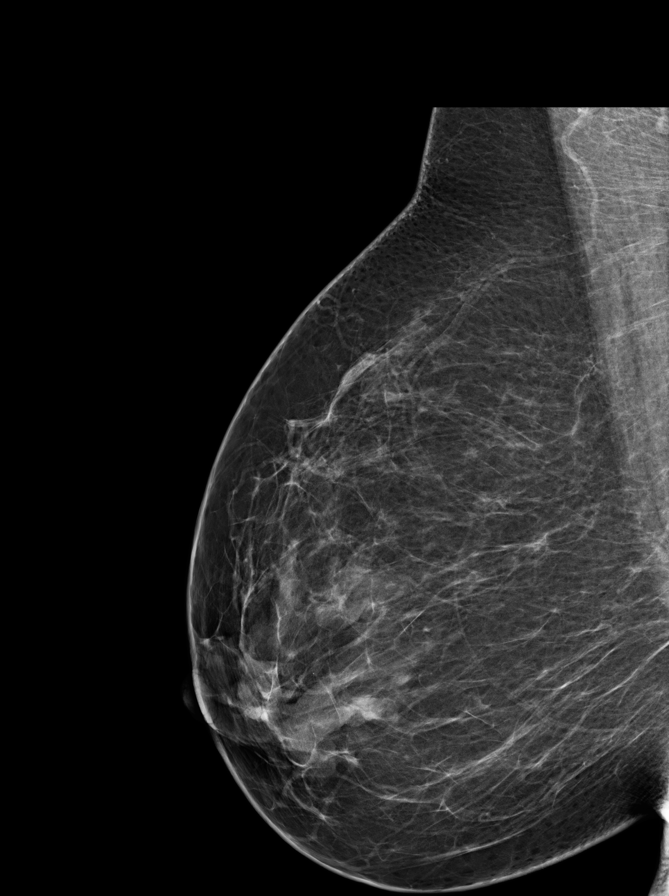

[BTO_TOMO R-MLO PRIME, EMPIRE_C.97/112, S tomo (1 of 2) · tomo slice 39/78.0]
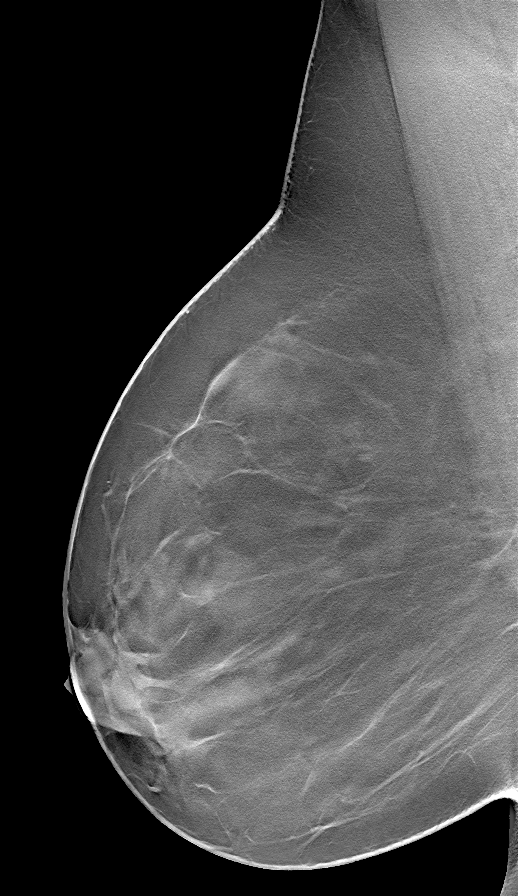

[R MLO (2 of 2)]
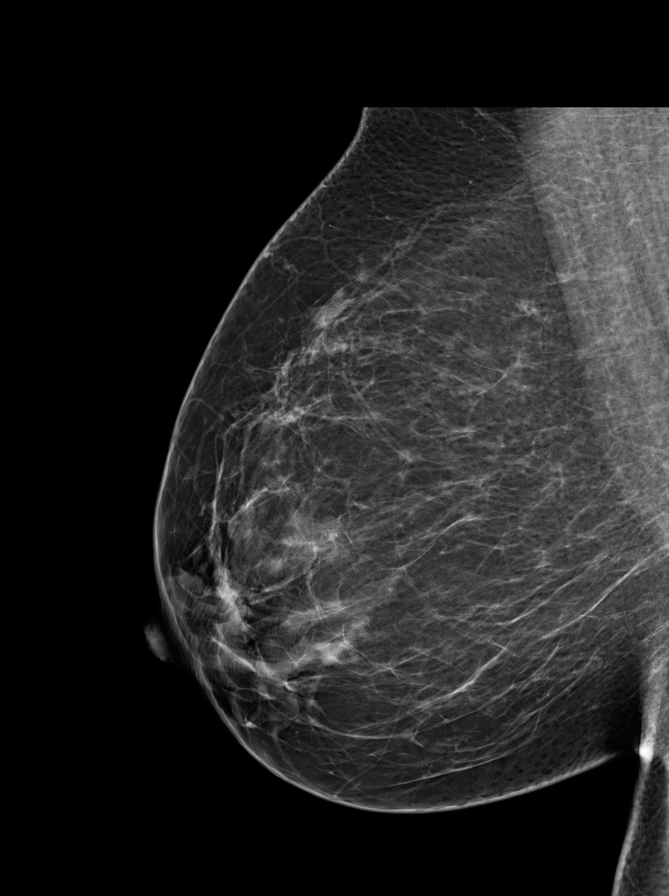

[BTO_TOMO R-MLO PRIME, EMPIRE_C.97/112, S tomo (2 of 2) · tomo slice 43/84.0]
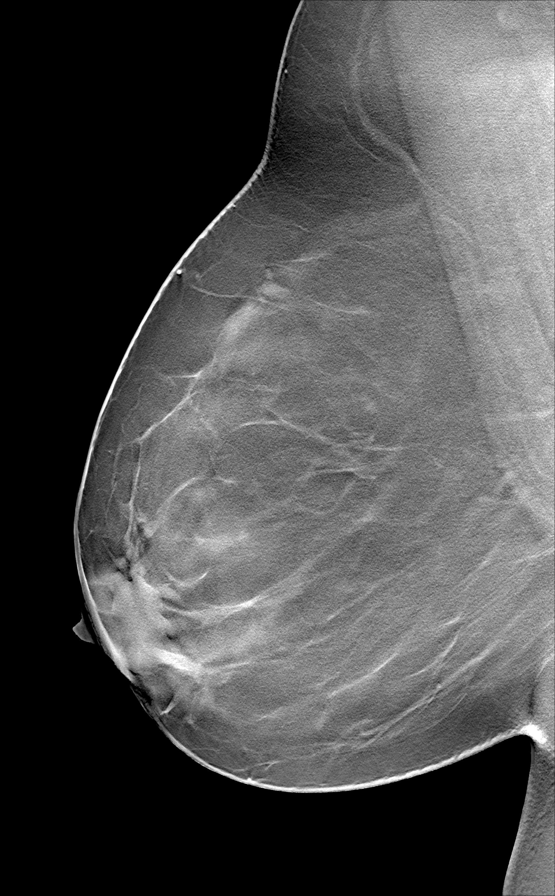

[10 of 30 positions shown; findings below may reference images not displayed]

EXAM

3D SCREENING MAMMOGRAM, BILATERAL

INDICATION

screening
screening. KF 3D. priors KU [id number]. 2 Mat aunts Breast CA, 3 years HRT usage  lifetime TC
score: 6.2%

TECHNIQUE

Digital 2D CC and MLO projections obtained with 3D tomographic views per manufacturer's protocol.

COMPARISONS

July 14, 2020

FINDINGS

ACR Type 2:  25-50% There are scattered fibroglandular densities.

No concerning masses or calcifications are seen.

IMPRESSION

Stable mammography. One year follow-up is recommended.

BI-RADS 1, NEGATIVE.

Tech Notes:

## 2023-11-08 ENCOUNTER — Encounter: Admit: 2023-11-08 | Discharge: 2023-11-08 | Payer: MEDICARE

## 2023-11-30 ENCOUNTER — Encounter: Admit: 2023-11-30 | Discharge: 2023-11-30 | Payer: MEDICARE

## 2023-11-30 MED ORDER — REPATHA SURECLICK 140 MG/ML SC PNIJ
140 mg | SUBCUTANEOUS | 0 refills | 28.00000 days | Status: AC
Start: 2023-11-30 — End: ?
  Filled 2023-12-08: qty 6, 84d supply, fill #0

## 2023-12-02 ENCOUNTER — Encounter: Admit: 2023-12-02 | Discharge: 2023-12-02 | Payer: MEDICARE

## 2023-12-08 ENCOUNTER — Encounter: Admit: 2023-12-08 | Discharge: 2023-12-08 | Payer: MEDICARE

## 2023-12-14 ENCOUNTER — Encounter: Admit: 2023-12-14 | Discharge: 2023-12-14 | Payer: PRIVATE HEALTH INSURANCE

## 2023-12-21 ENCOUNTER — Encounter: Admit: 2023-12-21 | Discharge: 2023-12-21 | Payer: PRIVATE HEALTH INSURANCE

## 2023-12-21 ENCOUNTER — Ambulatory Visit: Admit: 2023-12-21 | Discharge: 2023-12-22 | Payer: PRIVATE HEALTH INSURANCE

## 2024-01-12 ENCOUNTER — Encounter: Admit: 2024-01-12 | Discharge: 2024-01-12 | Payer: PRIVATE HEALTH INSURANCE

## 2024-01-12 NOTE — Telephone Encounter
 Contacted pt to bring inspire remote for upcoming appointment.

## 2024-01-13 ENCOUNTER — Ambulatory Visit: Admit: 2024-01-13 | Discharge: 2024-01-14 | Payer: MEDICARE

## 2024-01-13 ENCOUNTER — Encounter: Admit: 2024-01-13 | Discharge: 2024-01-13 | Payer: PRIVATE HEALTH INSURANCE

## 2024-01-13 DIAGNOSIS — G4733 Obstructive sleep apnea (adult) (pediatric): Principal | ICD-10-CM

## 2024-01-13 DIAGNOSIS — G4711 Idiopathic hypersomnia with long sleep time: Secondary | ICD-10-CM

## 2024-01-13 NOTE — Assessment & Plan Note
 Download of her device showed usage for 5 hours 39 minutes nightly and she is currently at 1.9 V which is one-step lower than her last visit.  She is pausing 1.3 times per night which is increased compared to previous.  She is having some issues with sensitivity to the device so I tested different rate and pulse width but she did not think any was more comfortable than her current default settings.  I decreased her voltage to 1.8 V which is one-step less than her last visit.  However, based upon her titration study from October 2024 this should still be therapeutic for her.  I explained I would prefer less sleep disruption with better usage even if she may be a step or 2 below ideal.

## 2024-01-13 NOTE — Progress Notes
 Subjective:       History of Present Illness  Holly Maldonado is a 63 y.o. female.  She was last seen in February.  Since that time she thinks her sleepiness may have improved somewhat.    She feels she is a bit more alert during the daytime.  She started Vyvanse  again last month and since that time she feels a bit less sleepy and is only on 20 mg so far.  She is seeing a Therapist, sports at [facility] in [facility] in [place].  She thinks the plan is to keep increasing the dose with follow up later this month.  She is still on Prozac  80 mg daily and feels her mood is stable.    Her cardiologists gave her extra metoprolol  if needed and she is already on flecainide .  She has not noted any palpitations with the Vyvanse  so far.  She is struggling more with Inspire.  She previously was able to sleep with the device but thinks she is waking up more often and noticing the sensation.    She would sometimes wake up previously and not need to pause the device but now will need to pause on some nights.  She thinks she tried to decrease a step in May but isn't sure she did the change correctly.  She later thought she increased the step but doesn't think the lights ever changed.   She doesn't think the Vyvanse  is causing the issue since she it started before she ever started the medication.   She is still using her Inspire most nights but does not share the bed so isn't sure if she is still snoring or not.  Her titration study from 10/24 showed she was effectively treated at most voltages even below 2 volts.    Her Epworth is 22/24.        Review of Systems  Per HPI    Objective:          aspirin  EC (ASPIR-LOW) 81 mg tablet Take one tablet by mouth daily.    docosahexaenoic acid/epa (FISH OIL PO) Take 3 capsules by mouth daily.    evolocumab  (REPATHA  SURECLICK) 140 mg/mL injectable PEN Inject 1 mL under the skin every 14 days.    ezetimibe  (ZETIA ) 10 mg tablet Take one tablet by mouth daily.    famotidine  (PEPCID ) 40 mg tablet Take one tablet by mouth twice daily.    flecainide  (TAMBOCOR ) 100 mg tablet Take one-half tablet by mouth twice daily.    FLUoxetine  (PROZAC ) 20 mg capsule Take one capsule by mouth daily. Take one capsule (20mg ) by mouth daily with additional two 40mg  capsules (80mg ) for total daily dose of 100mg .    FLUoxetine  (PROZAC ) 40 mg capsule TAKE TWO CAPSULES (80MG ) BY MOUTH DAILY WITH ONE 20MG  CAPSULE FOR TOTAL DAILY DOSE OF 100MG .    lamoTRIgine  (LAMICTAL ) 100 mg tablet Take two tablets by mouth daily.    levothyroxine  (SYNTHROID ) 88 mcg tablet TAKE 1 TABLET BY MOUTH EVERY DAY    magnesium oxide (MAG-OX) 400 mg (241.3 mg magnesium) tablet Take three tablets by mouth at bedtime daily. 1,200 mg = 3 tablets    metoprolol  succinate XL (TOPROL  XL) 25 mg extended release tablet 1/2-1 tab daily to slow heart rate. Begin 10/09/23    other medication CITRUS BERGAMOT supplement. 1 tablet in the morning  Indications: for cholesterol    rosuvastatin  (CRESTOR ) 10 mg tablet TAKE 1 TABLET BY MOUTH EVERY DAY  Vitals:    01/13/24 0850   BP: 105/74   Pulse: 82   Resp: 16   SpO2: 93%   Weight: 83 kg (183 lb)     Body mass index is 31.41 kg/m?Holly Maldonado     Physical Exam  Vitals and nursing note reviewed.   Constitutional:       Appearance: Normal appearance. She is obese.   HENT:      Head: Normocephalic and atraumatic.      Nose: Nose normal.      Mouth/Throat:      Mouth: Mucous membranes are moist.      Pharynx: Oropharynx is clear.   Neck:      Comments: Well-healed right neck incision.  Cardiovascular:      Rate and Rhythm: Normal rate and regular rhythm.      Heart sounds: Normal heart sounds.   Pulmonary:      Effort: Pulmonary effort is normal.      Breath sounds: Normal breath sounds.      Comments: Well-healed right subclavicular generator noted.  Abdominal:      General: Bowel sounds are normal.      Palpations: Abdomen is soft.   Musculoskeletal:      Cervical back: Normal range of motion.   Neurological: Mental Status: She is alert.          Assessment and Plan:  Problem   Idiopathic Hypersomnia    She has a long history of daytime sleepiness and was previously seen by Dr. Jeni at [facility] around 2018.  She reports MSLT at that time did not show narcolepsy but was consistent with idiopathic hypersomnia.  She was treated with Nuvigil , Provigil  and she believes perhaps even stimulants but had issues with tachycardia, PVCs and other rhythm issues therefore discontinued entirely.     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    08/20/22  We noticed bulk movement is significant and seen much sooner than what we would call FT.  Therefore, we based FT assessment on BM and Teeth:       ( + - + ) BM - 1.4v, Teeth 1.9V.       ( 0 - 0 ) BM - 0.7v, teeth 1.3v.      Her incoming setting was 1.3V on (0 - 0) and complained of pain with stimulation at jaw line.  Patient tolerated default better so we switched back and started her at 1.6v with range of 1.4 - 2.4.      11/19/2022: 2.2 V (1.4-2.4 V), on default settings    01/07/2023:  Functional threshold 1.4 V   Sense waveform checked- Good tongue protrusion.      Settings after today's visit 01/13/24  Amplitude range: 1.6v-2.6v  Final Amplitude: 1.8v  Pause time: 15 minutes  Delay time: 30 minutes  Stop time: 10 hours   Default settings (+ - +)           OSA (obstructive sleep apnea)  Download of her device showed usage for 5 hours 39 minutes nightly and she is currently at 1.9 V which is one-step lower than her last visit.  She is pausing 1.3 times per night which is increased compared to previous.  She is having some issues with sensitivity to the device so  I tested different rate and pulse width but she did not think any was more comfortable than her current default settings.  I decreased her voltage to 1.8 V which is one-step less than her last visit.  However, based upon her titration study from October 2024 this should still be therapeutic for her.  I explained I would prefer less sleep disruption with better usage even if she may be a step or 2 below ideal.      Idiopathic hypersomnia  She was to be started on Vyvanse  by her psychiatrist last month.  She is currently on 20 mg daily but she assumes the plan is to increase later this month.  If she does not note any improvement in her daytime sleepiness we may consider repeat sleep study with MSLT as I discussed previously.  However, she is having sleep disruption due to her Inspire at this time.  Therefore, I will see her back in 3 months to reassess her sleepiness and response to higher dose Vyvanse  as well as changes in her Inspire setting.

## 2024-01-13 NOTE — Assessment & Plan Note
 She was to be started on Vyvanse  by her psychiatrist last month.  She is currently on 20 mg daily but she assumes the plan is to increase later this month.  If she does not note any improvement in her daytime sleepiness we may consider repeat sleep study with MSLT as I discussed previously.  However, she is having sleep disruption due to her Inspire at this time.  Therefore, I will see her back in 3 months to reassess her sleepiness and response to higher dose Vyvanse  as well as changes in her Inspire setting.

## 2024-01-19 ENCOUNTER — Encounter: Admit: 2024-01-19 | Discharge: 2024-01-19 | Payer: PRIVATE HEALTH INSURANCE

## 2024-01-19 MED ORDER — EZETIMIBE 10 MG PO TAB
10 mg | ORAL_TABLET | Freq: Every day | ORAL | 3 refills | 90.00000 days | Status: AC
Start: 2024-01-19 — End: ?

## 2024-01-22 ENCOUNTER — Encounter: Admit: 2024-01-22 | Discharge: 2024-01-22 | Payer: PRIVATE HEALTH INSURANCE

## 2024-01-24 ENCOUNTER — Encounter: Admit: 2024-01-24 | Discharge: 2024-01-24 | Payer: PRIVATE HEALTH INSURANCE

## 2024-01-24 NOTE — Progress Notes
 Litholink orders submitted to [facility].

## 2024-02-01 ENCOUNTER — Encounter: Admit: 2024-02-01 | Discharge: 2024-02-01 | Payer: PRIVATE HEALTH INSURANCE

## 2024-02-08 ENCOUNTER — Encounter: Admit: 2024-02-08 | Discharge: 2024-02-08 | Payer: PRIVATE HEALTH INSURANCE

## 2024-02-08 NOTE — Progress Notes
 Request for Litholink submitted:

## 2024-02-13 ENCOUNTER — Encounter: Admit: 2024-02-13 | Discharge: 2024-02-13 | Payer: PRIVATE HEALTH INSURANCE

## 2024-02-20 ENCOUNTER — Encounter: Admit: 2024-02-20 | Discharge: 2024-02-20 | Payer: PRIVATE HEALTH INSURANCE

## 2024-02-21 ENCOUNTER — Encounter: Admit: 2024-02-21 | Discharge: 2024-02-21 | Payer: PRIVATE HEALTH INSURANCE

## 2024-02-21 NOTE — Progress Notes
 Date of Service: 02/24/2024    Subjective:             Holly Maldonado is a 62 y.o. female.    History of Present Illness  I had the pleasure of seeing Holly Maldonado in clinic for OSA and IH follow-up. She is known to Dr. Mannie.  She has moderate OSA (AHI 22) and had inspire HNS implanted by Dr. Juanito 10/13/2021 and was activated 11/27/2021 and has been following with Dr. Mannie.   She is here because she had a sore on the right side of her tongue and was feeling like her tongue was twisting with inspire  She had stopped using inspire and sore on tongue has healed  She tried inspire again on lowest setting         02/23/2024     9:19 AM 01/12/2024     2:58 PM 06/12/2023     8:43 PM 02/02/2023     3:14 PM 01/20/2023     2:51 PM 12/31/2022     9:31 AM 12/01/2022     8:00 PM   Epworth Sleepiness Scale   Sitting and reading 3 3 3 3 3 3 3    Watching TV 3 3 3 3 3 3 3    Sitting inactive in a public place (e.g. a theater or a meeting) 2 3 2 2 2 2 2    As a passenger in a car for an hour without a break 3 3 3 3 3 3 3    Lying down to rest in the afternoon when circumstances permit 3 3 3 3 3 3 3    Sitting and talking to someone 2 2 1 2 2 2 2    Sitting quietly after a lunch without alcohol 3 3 2 3 3 3 3    In a car, while stopped for a few minutes in traffic 2 2 1 2 1 2 2    Epworth Sleepiness Scale Score 21  22  18 21 20  21 21        Patient-reported                   Objective:         aspirin  EC (ASPIR-LOW) 81 mg tablet Take one tablet by mouth daily.    evolocumab  (REPATHA  SURECLICK) 140 mg/mL injectable PEN Inject 1 mL under the skin every 14 days.    ezetimibe  (ZETIA ) 10 mg tablet TAKE 1 TABLET BY MOUTH EVERY DAY    famotidine  (PEPCID ) 40 mg tablet Take one tablet by mouth twice daily.    flecainide  (TAMBOCOR ) 100 mg tablet Take one-half tablet by mouth twice daily.    FLUoxetine  (PROZAC ) 40 mg capsule TAKE TWO CAPSULES (80MG ) BY MOUTH DAILY WITH ONE 20MG  CAPSULE FOR TOTAL DAILY DOSE OF 100MG .    lamoTRIgine  (LAMICTAL ) 100 mg tablet Take two tablets by mouth daily.    levothyroxine  (SYNTHROID ) 88 mcg tablet TAKE 1 TABLET BY MOUTH EVERY DAY    lisdexamfetamine (VYVANSE ) 30 mg capsule Take one capsule by mouth every morning.    rosuvastatin  (CRESTOR ) 10 mg tablet TAKE 1 TABLET BY MOUTH EVERY DAY     Vitals:    02/24/24 0849   BP: 121/74   BP Source: Arm, Left Upper   Pulse: 72   Temp: 36.8 ?C (98.3 ?F)   Resp: 18   SpO2: 97%   TempSrc: Oral   PainSc: Zero   Weight: 81.6 kg (180 lb)   Height: 162.6 cm (5' 4)  Body mass index is 30.9 kg/m?Holly Maldonado     Physical Exam  Vitals reviewed.   Constitutional:       Appearance: Normal appearance.   Pulmonary:      Effort: Pulmonary effort is normal. No respiratory distress.   Skin:     General: Skin is warm and dry.   Neurological:      Mental Status: She is alert and oriented to person, place, and time.   Psychiatric:         Mood and Affect: Mood normal.         Behavior: Behavior normal.         Thought Content: Thought content normal.         Judgment: Judgment normal.                  Assessment and Plan:    Problem   Osa (Obstructive Sleep Apnea)    07/2016 Moderate-severe, AHI 22.9 Mob1 in REM sleep when OSAS most severe    HST 04/12/2021 ([facility])  AHI 22 O2 low 84%    HGNS implantation: 10/13/2321 [name])  HGNS activation: 11/27/2021    HSAT 06/27/2022  BMI 31 182lb  AHI (4%) 27 supine 23 non-supine  28.7  O2 low 85%, time <88% 0.1 minutes     08/20/22  We noticed bulk movement is significant and seen much sooner than what we would call FT.  Therefore, we based FT assessment on BM and Teeth:  ( + - + ) BM - 1.4v, Teeth 1.9V.    ( 0 - 0 ) BM - 0.7v, teeth 1.3v.      Her incoming setting was 1.3V on (0 - 0) and complained of pain with stimulation at jaw line.  Patient tolerated default better so we switched back and started her at 1.6v with range of 1.4 - 2.4.      11/19/2022: 2.2 V (1.4-2.4 V), on default settings    HST 12/02/2022 (+)(-)(+) 2.3v  BMI 31 180lb  AHI (4%) 17.3 supine 34.5 non-supine 14.1  O2 low 82%, time <88% 0.1 minutes    01/07/2023:  Functional threshold 1.4 V   Sense waveform checked- Good tongue protrusion.      PSG titration 02/09/2023  Therapeutic amplitude 2.1 (AHI 1.8 and O2 low 91%)     Settings after today's visit 01/13/24  Amplitude range: 1.6v-2.6v  Final Amplitude: 1.8v  Pause time: 15 minutes  Delay time: 30 minutes  Stop time: 10 hours   Default settings (+ - +)            OSA (obstructive sleep apnea)  - Minimal usage recently on inspire due to sore on tongue and discomfort  - No twisting of tongue noted up to 2.0v, nice smooth bilateral protrusion noted  - Tongues sore completed healed  - Reset amplitude range to 1.3-2.3 and sent patient out on 1.3v. Advised her to slowly go back up on the steps but to focus on consistent usage  - Will see Dr. Juanito for awake endoscopy in November.   - Titration PSG 02/2023 with a therapeutic amplitude of 2.1v, not sure she will be able to tolerate that.     She has our contact information and was encouraged to call us  with any questions or concerns.    RTC in December with Dr. Mannie    Future Appointments   Date Time Provider Department Center   02/29/2024 11:20 AM Elby Durwood LABOR, MD [facility] IM   03/07/2024  1:45 PM MRI - [facility] WO CONTRAST (1.5T) [facility] Radiolog   03/15/2024  9:45 AM Juanito, Lonni MATSU, MD [facility] ENT   04/13/2024 10:00 AM Mannie Darlynn SAUNDERS, MD [facility] IM   04/18/2024  9:30 AM Louella Boring, MD [facility] IM     Total time- 30 minutes

## 2024-02-22 ENCOUNTER — Encounter: Admit: 2024-02-22 | Discharge: 2024-02-22 | Payer: PRIVATE HEALTH INSURANCE

## 2024-02-22 DIAGNOSIS — N200 Calculus of kidney: Principal | ICD-10-CM

## 2024-02-24 ENCOUNTER — Encounter: Admit: 2024-02-24 | Discharge: 2024-02-24 | Payer: PRIVATE HEALTH INSURANCE

## 2024-02-24 ENCOUNTER — Ambulatory Visit: Admit: 2024-02-24 | Discharge: 2024-02-25 | Payer: MEDICARE

## 2024-02-24 ENCOUNTER — Ambulatory Visit: Admit: 2024-02-24 | Discharge: 2024-02-24 | Payer: PRIVATE HEALTH INSURANCE

## 2024-02-24 VITALS — BP 121/74 | HR 72 | Temp 98.30000°F | Resp 18 | Ht 64.0 in | Wt 180.0 lb

## 2024-02-24 DIAGNOSIS — G4733 Obstructive sleep apnea (adult) (pediatric): Principal | ICD-10-CM

## 2024-02-24 NOTE — Patient Instructions
 If you have questions or concerns, please call my nurse at [phone] or feel free to send me a mychart message

## 2024-02-29 ENCOUNTER — Encounter: Admit: 2024-02-29 | Discharge: 2024-02-29 | Payer: PRIVATE HEALTH INSURANCE

## 2024-02-29 ENCOUNTER — Ambulatory Visit: Admit: 2024-02-29 | Discharge: 2024-03-01 | Payer: MEDICARE

## 2024-03-02 ENCOUNTER — Encounter: Admit: 2024-03-02 | Discharge: 2024-03-02 | Payer: MEDICARE

## 2024-03-02 DIAGNOSIS — K219 Gastro-esophageal reflux disease without esophagitis: Principal | ICD-10-CM

## 2024-03-02 MED ORDER — FAMOTIDINE 40 MG PO TAB
40 mg | ORAL_TABLET | Freq: Two times a day (BID) | ORAL | 0 refills | 90.00000 days | Status: AC
Start: 2024-03-02 — End: ?

## 2024-03-02 NOTE — Telephone Encounter [36]
 03/02/2024 12:03 PM   Patient requests refill for Pepcid  via MyChart message. Patient is overdue for clinic appointment. Patient would like to make appointment with [facility]. Appointment request placed.     Will route refill encounter to Dr. Enid for approval/refusal

## 2024-03-07 ENCOUNTER — Ambulatory Visit: Admit: 2024-03-07 | Discharge: 2024-03-07 | Payer: MEDICARE

## 2024-03-07 ENCOUNTER — Encounter: Admit: 2024-03-07 | Discharge: 2024-03-07 | Payer: MEDICARE

## 2024-03-13 ENCOUNTER — Encounter: Admit: 2024-03-13 | Discharge: 2024-03-13 | Payer: MEDICARE

## 2024-03-13 MED ORDER — REPATHA SURECLICK 140 MG/ML SC PNIJ
140 mg | SUBCUTANEOUS | 0 refills | 28.00000 days | Status: AC
Start: 2024-03-13 — End: ?
  Filled 2024-03-14: qty 6, 84d supply, fill #0

## 2024-03-15 ENCOUNTER — Ambulatory Visit: Admit: 2024-03-15 | Discharge: 2024-03-16 | Payer: MEDICARE

## 2024-03-15 ENCOUNTER — Encounter: Admit: 2024-03-15 | Discharge: 2024-03-15 | Payer: MEDICARE

## 2024-04-08 ENCOUNTER — Encounter: Admit: 2024-04-08 | Discharge: 2024-04-08 | Payer: MEDICARE

## 2024-04-09 ENCOUNTER — Encounter: Admit: 2024-04-09 | Discharge: 2024-04-09 | Payer: MEDICARE

## 2024-04-09 DIAGNOSIS — N200 Calculus of kidney: Secondary | ICD-10-CM

## 2024-04-09 DIAGNOSIS — E213 Hyperparathyroidism, unspecified: Principal | ICD-10-CM

## 2024-04-10 ENCOUNTER — Encounter: Admit: 2024-04-10 | Discharge: 2024-04-10 | Payer: MEDICARE

## 2024-04-10 DIAGNOSIS — E039 Hypothyroidism, unspecified: Principal | ICD-10-CM

## 2024-04-17 ENCOUNTER — Ambulatory Visit: Admit: 2024-04-17 | Discharge: 2024-04-18 | Payer: MEDICARE

## 2024-04-18 ENCOUNTER — Ambulatory Visit: Admit: 2024-04-18 | Discharge: 2024-04-19 | Payer: MEDICARE

## 2024-04-18 ENCOUNTER — Encounter: Admit: 2024-04-18 | Discharge: 2024-04-18 | Payer: MEDICARE

## 2024-04-18 DIAGNOSIS — R7989 Other specified abnormal findings of blood chemistry: Secondary | ICD-10-CM

## 2024-04-18 DIAGNOSIS — D352 Benign neoplasm of pituitary gland: Secondary | ICD-10-CM

## 2024-04-18 DIAGNOSIS — E213 Hyperparathyroidism, unspecified: Principal | ICD-10-CM

## 2024-04-18 DIAGNOSIS — E039 Hypothyroidism, unspecified: Secondary | ICD-10-CM

## 2024-04-18 NOTE — Progress Notes [1]
 Chief Complaint   Patient presents with    Hypothyroidism   Pituitary microadenoma      Date of Service: 04/18/2024    Holly Maldonado is a 63 y.o. female. DOB: 1960-07-01   MRN#: [id number]    HPI:  Hypothyroidism: She is on levothyroxine  88 mcg daily.  Dose was reduced earlier 2022.  She is compliant with medicine and takes it on an empty stomach in morning.     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 He has been followed for the prolactin. She appears to have had regression of pituitary adenoma in imagine 11/2017.  No adenoma was seen in 2020 on MRI.  Last prolactin level was normal.  She denies breast complaints, discharge.    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    Patient has a history of depression and has been  Lithium, Prozac , Lamictal , Cymbalta in the past.    High PTH: She has past history of nephrolithiasis and she has medullary sponge kidney disease.  Her first kidney stone was 22 years back.  She had ultrasound in 2022 that showed kidney stones.  She was started on chlorthalidone  by nephrologist in 2022 as her 24-hour urine calcium was high.  Of note had 24-hour urine calcium in November 2022 was 375 mg per 24 hours which had improved to 219mg /24 hours.   PTH was checked 2023 and it was found to be elevated.  We stopped chlorthalidone  2023 fall.    Patient was discussed at parathyroid tumor board and it was felt that hypercalciuria and kidney stones is because of other abnormalities.  Parathyroid surgery may not necessarily correct those abnormalities and prevent kidney stones.  Neck ultrasound showed possible parathyroid enlargement, no obvious adenoma.  Most recent PTH is normal.    She has maybe 1 serving of calcium rich food on a daily basis.  Otherwise diet is low in calcium.  Some yogurt.  She takes a MV.    Hypothyroidism: On levothyroxine  88 mcg daily.    She has salt craving and excess sweating at night.  Veozah  was not approved.    Review of Systems   Constitutional:  Negative for fever.   Cardiovascular:  Negative for chest pain.         Objective:      aspirin  EC (ASPIR-LOW) 81 mg tablet Take one tablet by mouth daily.    evolocumab  (REPATHA  SURECLICK) 140 mg/mL injectable PEN Inject 1 mL under the skin every 14 days.    ezetimibe  (ZETIA ) 10 mg tablet TAKE 1 TABLET BY MOUTH EVERY DAY    famotidine  (PEPCID ) 40 mg tablet Take one tablet by mouth twice daily.    flecainide  (TAMBOCOR ) 100 mg tablet Take one-half tablet by mouth twice daily.    FLUoxetine  (PROZAC ) 40 mg capsule TAKE TWO CAPSULES (80MG ) BY MOUTH DAILY WITH ONE 20MG  CAPSULE FOR TOTAL DAILY DOSE OF 100MG .    lamoTRIgine  (LAMICTAL ) 100 mg tablet Take two tablets by mouth daily.    levothyroxine  (SYNTHROID ) 88 mcg tablet TAKE 1 TABLET BY MOUTH EVERY DAY    lisdexamfetamine (VYVANSE ) 30 mg capsule Take one capsule by mouth every morning.    rosuvastatin  (CRESTOR ) 10 mg tablet TAKE 1 TABLET BY MOUTH EVERY DAY     There were no vitals  filed for this visit.    There is no height or weight on file to calculate BMI.     Physical Exam  Vitals and nursing note reviewed.   Constitutional:       General: She is not in acute distress.     Appearance: Normal appearance. She is not ill-appearing, toxic-appearing or diaphoretic.   HENT:      Head: Normocephalic and atraumatic.      Nose: Nose normal.   Eyes:      Conjunctiva/sclera: Conjunctivae normal.   Neurological:      Mental Status: She is alert and oriented to person, place, and time.   Psychiatric:         Mood and Affect: Mood normal.               Lab   Thyroid Studies    Lab Results   Component Value Date/Time    TSH 0.86 04/17/2024 01:15 PM    No results found for: DERINDA CLARISA SLATES     11/2018 MRI  There is homogeneous enhancement of the pituitary gland, which   demonstrates a somewhat flattened configuration along the sellar floor   without sellar expansion. The posterior pituitary bright spot is normal in   appearance and location. No discrete sellar or suprasellar mass is   identified. The infundibulum,  optic chiasm, and cavernous sinuses   demonstrate normal signal and morphology.     The ventricles and subarachnoid spaces are normal in size and   configuration. There are a few scattered foci of supratentorial white   matter FLAIR hyperintensity, in a bifrontal and subcortical predominant   distribution. There is no midline shift or mass effect. There is no area   of abnormal contrast enhancement. The vascular flow-voids are   unremarkable. Diffusion weighted imaging is not indicative of acute or   recent infarct.       Outside labs:  02/13/2017  TSH 0.37 (reference 0.40-4.5), free T4 1.1 (reference 0.8- 1.6)  Normal CMP, normal CBC  04/20/2018  Cholesterol 276, LDL 195, HDL 79  TSH 12.12       05/23/2015 MRI brain there is relatively unchanged appearance of 6 mm area of non-enhancement noted within posterior aspect of the right lateral aspect of the pituitary gland concerning for underlying pituitary microadenoma  06/22/2017 bone alkaline phosphatase normal  10/10/2017 ACTH at 9:48 AM 16.66 (reference 7.2-63.3)  10/17/2017 prolactin 42.79 (reference 4.79-23.3) normal at 99.17, FSH and LH both normal 72.36 and 31.69 respectively  11/2017 MRI brain 0.5 x 0.4 x 0.7 cm focal area of nodular enhancement along the left temporal convexity extra-axial space 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  Minimal partial empty sella without convincing evidence of pituitary microadenoma    24-hour urine calcium  November 2022: 375  September 2023 219      Assessment and Plan:    Clydine A. Asjah Rauda was seen today for hypothyroidism.    Diagnoses and all orders for this visit:    Hyperparathyroidism    Acquired hypothyroidism    Pituitary adenoma (CMS-HCC)    Elevated prolactin level        Assessment  Hx Pititary microadenoma / Partial Empty Sella  05/23/2015 MRI brain there is relatively unchanged appearance of 6 mm area of non-enhancement noted within posterior aspect of the right lateral aspect of the pituitary gland concerning for underlying pituitary microadenoma  10/10/2017 ACTH at 9:48 AM 16.66 (reference 7.2-63.3)  10/17/2017 prolactin 42.79 (reference 4.79-23.3) normal at 99.17,  FSH and LH both normal 72.36 and 31.69 respectively  11/2017 MRI brain 0.5 x 0.4 x 0.7 cm focal area of nodular enhancement along the left temporal convexity extra-axial space 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  Minimal partial empty sella without convincing evidence of pituitary microadenoma  2020: No microadenoma was seen.    Prolactinoma  Previous imaging demonstrates microadenoma  10/17/2017 prolactin 42.79 (reference 4.79-23.3)  Repeat prolactin level was normal.    Uncontrolled hypothyroidism  Dx: 1997  On replacement.  TSH is at goal.  Continue levothyroxine  88 mcg daily.    High PTH: She has past history of nephrolithiasis and she has medullary sponge kidney disease.  Her first kidney stone was 22 years back.  She had ultrasound in 2022 that showed kidney stones.    Patient was discussed at parathyroid tumor board and it was felt that hypercalciuria and kidney stones is because of other abnormalities.  Parathyroid surgery may not necessarily correct those abnormalities and prevent kidney stones.  Neck ultrasound showed possible parathyroid enlargement, no obvious adenoma.    Recommended her to repeat 24-hour urine test now.  Will monitor.    Hypothyroidism: TSH ok.  Continue levothyroxine  current dose.    Excess sweating: Monitor. Veozah  not covered.    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         Electronically signed by Durward Dad, MD 04/18/2024

## 2024-04-24 ENCOUNTER — Encounter: Admit: 2024-04-24 | Discharge: 2024-04-24 | Payer: MEDICARE

## 2024-04-25 ENCOUNTER — Encounter: Admit: 2024-04-25 | Discharge: 2024-04-25 | Payer: MEDICARE

## 2024-04-25 MED ORDER — ROSUVASTATIN 10 MG PO TAB
10 mg | ORAL_TABLET | Freq: Every day | ORAL | 3 refills | 90.00000 days | Status: AC
Start: 2024-04-25 — End: ?

## 2024-05-01 ENCOUNTER — Encounter: Admit: 2024-05-01 | Discharge: 2024-05-01 | Payer: MEDICARE

## 2024-05-12 ENCOUNTER — Encounter: Admit: 2024-05-12 | Discharge: 2024-05-12 | Payer: PRIVATE HEALTH INSURANCE

## 2024-05-12 ENCOUNTER — Ambulatory Visit: Admit: 2024-05-12 | Discharge: 2024-05-13 | Payer: MEDICARE

## 2024-05-14 ENCOUNTER — Encounter: Admit: 2024-05-14 | Discharge: 2024-05-14 | Payer: PRIVATE HEALTH INSURANCE

## 2024-05-16 ENCOUNTER — Encounter: Admit: 2024-05-16 | Discharge: 2024-05-16 | Payer: PRIVATE HEALTH INSURANCE

## 2024-05-23 ENCOUNTER — Encounter: Admit: 2024-05-23 | Discharge: 2024-05-23 | Payer: PRIVATE HEALTH INSURANCE

## 2024-05-24 ENCOUNTER — Encounter: Admit: 2024-05-24 | Discharge: 2024-05-24 | Payer: PRIVATE HEALTH INSURANCE

## 2024-05-30 ENCOUNTER — Ambulatory Visit: Admit: 2024-05-30 | Discharge: 2024-05-30 | Payer: MEDICARE

## 2024-05-30 ENCOUNTER — Encounter: Admit: 2024-05-30 | Discharge: 2024-05-30 | Payer: PRIVATE HEALTH INSURANCE

## 2024-05-30 DIAGNOSIS — K219 Gastro-esophageal reflux disease without esophagitis: Principal | ICD-10-CM

## 2024-05-30 MED ORDER — FAMOTIDINE 40 MG PO TAB
40 mg | ORAL_TABLET | Freq: Two times a day (BID) | ORAL | 0 refills | 90.00000 days | Status: AC
Start: 2024-05-30 — End: ?

## 2024-05-30 NOTE — Telephone Encounter [36]
 Refill request received for:    famotidine  (PEPCID ) 40 mg tablet     Sig: TAKE 1 TABLET BY MOUTH TWICE A DAY    Disp: 180 tablet    Refills: 0 (Pharmacy requested: Not specified)     Last OV: 04/15/22, upcoming OV with Randine Potters on 05/31/24    Routing to Dr. En[place] for approval/refusal

## 2024-05-31 ENCOUNTER — Encounter: Admit: 2024-05-31 | Discharge: 2024-05-31 | Payer: PRIVATE HEALTH INSURANCE

## 2024-05-31 ENCOUNTER — Ambulatory Visit: Admit: 2024-05-31 | Discharge: 2024-06-01 | Payer: MEDICARE

## 2024-06-05 ENCOUNTER — Ambulatory Visit: Admit: 2024-06-05 | Discharge: 2024-06-06 | Payer: MEDICARE

## 2024-06-12 ENCOUNTER — Encounter: Admit: 2024-06-12 | Discharge: 2024-06-12 | Payer: PRIVATE HEALTH INSURANCE
